# Patient Record
Sex: Male | Born: 1945 | Race: White | Hispanic: No | Marital: Married | Smoking: Never smoker
Health system: Southern US, Community
[De-identification: ages and names within clinical notes are randomized; demographics above are authoritative.]

## PROBLEM LIST (undated history)

## (undated) DIAGNOSIS — M1990 Unspecified osteoarthritis, unspecified site: Secondary | ICD-10-CM

## (undated) DIAGNOSIS — I2510 Atherosclerotic heart disease of native coronary artery without angina pectoris: Secondary | ICD-10-CM

## (undated) DIAGNOSIS — I428 Other cardiomyopathies: Secondary | ICD-10-CM

## (undated) DIAGNOSIS — R002 Palpitations: Secondary | ICD-10-CM

## (undated) DIAGNOSIS — R1310 Dysphagia, unspecified: Secondary | ICD-10-CM

## (undated) DIAGNOSIS — I5020 Unspecified systolic (congestive) heart failure: Secondary | ICD-10-CM

## (undated) DIAGNOSIS — I8393 Asymptomatic varicose veins of bilateral lower extremities: Secondary | ICD-10-CM

## (undated) DIAGNOSIS — I4589 Other specified conduction disorders: Secondary | ICD-10-CM

## (undated) DIAGNOSIS — Z95810 Presence of automatic (implantable) cardiac defibrillator: Secondary | ICD-10-CM

## (undated) DIAGNOSIS — N135 Crossing vessel and stricture of ureter without hydronephrosis: Secondary | ICD-10-CM

## (undated) DIAGNOSIS — H269 Unspecified cataract: Secondary | ICD-10-CM

## (undated) DIAGNOSIS — I440 Atrioventricular block, first degree: Secondary | ICD-10-CM

## (undated) DIAGNOSIS — K209 Esophagitis, unspecified without bleeding: Secondary | ICD-10-CM

## (undated) DIAGNOSIS — E7800 Pure hypercholesterolemia, unspecified: Secondary | ICD-10-CM

## (undated) DIAGNOSIS — I472 Ventricular tachycardia: Secondary | ICD-10-CM

## (undated) DIAGNOSIS — K219 Gastro-esophageal reflux disease without esophagitis: Secondary | ICD-10-CM

## (undated) DIAGNOSIS — K222 Esophageal obstruction: Secondary | ICD-10-CM

## (undated) DIAGNOSIS — I499 Cardiac arrhythmia, unspecified: Secondary | ICD-10-CM

## (undated) DIAGNOSIS — K5790 Diverticulosis of intestine, part unspecified, without perforation or abscess without bleeding: Secondary | ICD-10-CM

## (undated) DIAGNOSIS — N400 Enlarged prostate without lower urinary tract symptoms: Secondary | ICD-10-CM

## (undated) DIAGNOSIS — I5022 Chronic systolic (congestive) heart failure: Secondary | ICD-10-CM

## (undated) DIAGNOSIS — I420 Dilated cardiomyopathy: Secondary | ICD-10-CM

## (undated) HISTORY — DX: Benign prostatic hyperplasia without lower urinary tract symptoms: N40.0

## (undated) HISTORY — PX: RETINAL DETACHMENT SURGERY: SHX105

## (undated) HISTORY — DX: Diverticulosis of intestine, part unspecified, without perforation or abscess without bleeding: K57.90

## (undated) HISTORY — DX: Dysphagia, unspecified: R13.10

## (undated) HISTORY — PX: COLONOSCOPY: SHX174

## (undated) HISTORY — PX: JOINT REPLACEMENT: SHX530

## (undated) HISTORY — DX: Atherosclerotic heart disease of native coronary artery without angina pectoris: I25.10

## (undated) HISTORY — PX: PROSTATE BIOPSY: SHX241

## (undated) HISTORY — PX: EYE SURGERY: SHX253

## (undated) HISTORY — DX: Unspecified cataract: H26.9

## (undated) HISTORY — DX: Unspecified systolic (congestive) heart failure: I50.20

## (undated) HISTORY — DX: Asymptomatic varicose veins of bilateral lower extremities: I83.93

---

## 2009-12-21 ENCOUNTER — Emergency Department: Payer: Self-pay | Admitting: Emergency Medicine

## 2009-12-25 ENCOUNTER — Emergency Department: Payer: Self-pay | Admitting: Emergency Medicine

## 2009-12-26 ENCOUNTER — Emergency Department: Payer: Self-pay | Admitting: Unknown Physician Specialty

## 2010-01-10 ENCOUNTER — Ambulatory Visit: Payer: Self-pay | Admitting: Urology

## 2010-01-14 ENCOUNTER — Ambulatory Visit: Payer: Self-pay | Admitting: Urology

## 2012-01-02 ENCOUNTER — Ambulatory Visit: Payer: Self-pay | Admitting: Unknown Physician Specialty

## 2014-03-20 DIAGNOSIS — H2511 Age-related nuclear cataract, right eye: Secondary | ICD-10-CM | POA: Diagnosis not present

## 2014-04-12 DIAGNOSIS — H2511 Age-related nuclear cataract, right eye: Secondary | ICD-10-CM | POA: Diagnosis not present

## 2014-04-18 ENCOUNTER — Ambulatory Visit: Payer: Self-pay | Admitting: Ophthalmology

## 2014-04-18 DIAGNOSIS — I499 Cardiac arrhythmia, unspecified: Secondary | ICD-10-CM | POA: Diagnosis not present

## 2014-04-18 DIAGNOSIS — H2511 Age-related nuclear cataract, right eye: Secondary | ICD-10-CM | POA: Diagnosis not present

## 2014-04-18 DIAGNOSIS — K219 Gastro-esophageal reflux disease without esophagitis: Secondary | ICD-10-CM | POA: Diagnosis not present

## 2014-07-09 NOTE — Op Note (Signed)
PATIENT NAME:  Leroy Merritt, Leroy Merritt MR#:  [id number] DATE OF BIRTH:  06-07-1945  DATE OF PROCEDURE:  04/18/2014  PREOPERATIVE DIAGNOSIS:  Nuclear sclerotic cataract of the right eye.   POSTOPERATIVE DIAGNOSIS:  Nuclear sclerotic cataract of the right eye.   OPERATIVE PROCEDURE:  Cataract extraction by phacoemulsification with implant of intraocular lens to right eye.   SURGEON:  Birder Robson, MD.   ANESTHESIA:  1. Managed anesthesia care.  2. Topical tetracaine drops followed by 2% Xylocaine jelly applied in the preoperative holding area.   COMPLICATIONS:  None.   TECHNIQUE:   Stop and chop.   DESCRIPTION OF PROCEDURE:  The patient was examined and consented in the preoperative holding area where the aforementioned topical anesthesia was applied to the right eye and then brought back to the Operating Room where the right eye was prepped and draped in the usual sterile ophthalmic fashion and a lid speculum was placed. A paracentesis was created with the side port blade and the anterior chamber was filled with viscoelastic. A near clear corneal incision was performed with the steel keratome. A continuous curvilinear capsulorrhexis was performed with a cystotome followed by the capsulorrhexis forceps. Hydrodissection and hydrodelineation were carried out with BSS on a blunt cannula. The lens was removed in a stop and chop technique and the remaining cortical material was removed with the irrigation-aspiration handpiece. The capsular bag was inflated with viscoelastic and the Tecnis ZCB00, 17.0-diopter lens, serial number [id number] was placed in the capsular bag without complication. The remaining viscoelastic was removed from the eye with the irrigation-aspiration handpiece. The wounds were hydrated. The anterior chamber was flushed with Miostat and the eye was inflated to physiologic pressure. 0.1 mL of cefuroxime concentration 10 mg/mL was placed in the anterior chamber. The wounds were found to be  water tight. The eye was dressed with Vigamox. The patient was given protective glasses to wear throughout the day and a shield with which to sleep tonight. The patient was also given drops with which to begin a drop regimen today and will follow-up with me in one day.    ____________________________ Livingston Diones. Bond Grieshop, MD wlp:by D: 04/18/2014 21:17:14 ET T: 04/19/2014 00:00:59 ET JOB#: [id number]  cc: Tashanda Fuhrer L. Annabella Elford, MD, <Dictator> Livingston Diones Aarit Kashuba MD ELECTRONICALLY SIGNED 04/19/2014 12:12

## 2014-12-20 ENCOUNTER — Encounter: Payer: Self-pay | Admitting: Family Medicine

## 2014-12-20 ENCOUNTER — Ambulatory Visit (INDEPENDENT_AMBULATORY_CARE_PROVIDER_SITE_OTHER): Payer: Commercial Managed Care - HMO | Admitting: Family Medicine

## 2014-12-20 VITALS — BP 134/71 | HR 71 | Temp 97.8°F | Ht 69.0 in | Wt 181.0 lb

## 2014-12-20 DIAGNOSIS — I8393 Asymptomatic varicose veins of bilateral lower extremities: Secondary | ICD-10-CM

## 2014-12-20 DIAGNOSIS — N400 Enlarged prostate without lower urinary tract symptoms: Secondary | ICD-10-CM | POA: Insufficient documentation

## 2014-12-20 DIAGNOSIS — Z0000 Encounter for general adult medical examination without abnormal findings: Secondary | ICD-10-CM | POA: Diagnosis not present

## 2014-12-20 DIAGNOSIS — R1310 Dysphagia, unspecified: Secondary | ICD-10-CM | POA: Insufficient documentation

## 2014-12-20 HISTORY — DX: Dysphagia, unspecified: R13.10

## 2014-12-20 HISTORY — DX: Asymptomatic varicose veins of bilateral lower extremities: I83.93

## 2014-12-20 MED ORDER — FINASTERIDE 5 MG PO TABS: 5.0000 mg | ORAL_TABLET | Freq: Every day | ORAL | Status: DC

## 2014-12-20 MED ORDER — OMEPRAZOLE 40 MG PO CPDR: 40.0000 mg | DELAYED_RELEASE_CAPSULE | Freq: Every day | ORAL | Status: DC

## 2014-12-20 MED ORDER — TAMSULOSIN HCL 0.4 MG PO CAPS: 0.4000 mg | ORAL_CAPSULE | Freq: Every day | ORAL | Status: DC

## 2014-12-20 NOTE — Assessment & Plan Note (Signed)
With symptoms getting worse patient will start taking Prilosec and do GI referral

## 2014-12-20 NOTE — Assessment & Plan Note (Signed)
Discussed support hose 

## 2014-12-20 NOTE — Assessment & Plan Note (Signed)
The current medical regimen is effective;  continue present plan and medications.  

## 2014-12-20 NOTE — Progress Notes (Signed)
BP 134/71 mmHg  Pulse 71  Temp(Src) 97.8 F (36.6 C)  Ht 5\' 9"  (1.753 m)  Wt 181 lb (82.101 kg)  BMI 26.72 kg/m2  SpO2 98%   Subjective:    Patient ID: Leroy Merritt, male    DOB: 20-Feb-1946, 69 y.o.   MRN: [id number]  HPI: CHET GREENLEY is a 69 y.o. male  Chief Complaint  Patient presents with  . Annual Exam   patient with some dysphasia food getting stuck to the point he has to choke it up to swallow again. Patient having hoarseness and throat clearing has treated with Tums but no other medications. This problems been going on for urinary half or so but getting worse. BPH issues resolved and stable on medications. Patient still noting palpitations on review of old EKGs was having frequent PVCs   Relevant past medical, surgical, family and social history reviewed and updated as indicated. Interim medical history since our last visit reviewed. Allergies and medications reviewed and updated.  Review of Systems  Constitutional: Negative.   HENT: Negative.   Eyes: Negative.   Respiratory: Negative.   Cardiovascular: Negative.   Gastrointestinal: Negative.   Endocrine: Negative.   Genitourinary: Negative.   Musculoskeletal: Negative.   Skin: Negative.   Allergic/Immunologic: Negative.   Neurological: Negative.   Hematological: Negative.   Psychiatric/Behavioral: Negative.     Per HPI unless specifically indicated above     Objective:    BP 134/71 mmHg  Pulse 71  Temp(Src) 97.8 F (36.6 C)  Ht 5\' 9"  (1.753 m)  Wt 181 lb (82.101 kg)  BMI 26.72 kg/m2  SpO2 98%  Wt Readings from Last 3 Encounters:  12/20/14 181 lb (82.101 kg)  12/15/13 180 lb (81.647 kg)    Physical Exam  Constitutional: He is oriented to person, place, and time. He appears well-developed and well-nourished.  HENT:  Head: Normocephalic and atraumatic.  Right Ear: External ear normal.  Left Ear: External ear normal.  Eyes: Conjunctivae and EOM are normal. Pupils are equal, round, and  reactive to light.  Neck: Normal range of motion. Neck supple.  Cardiovascular: Normal rate, regular rhythm, normal heart sounds and intact distal pulses.   Pulmonary/Chest: Effort normal and breath sounds normal.  Abdominal: Soft. Bowel sounds are normal. There is no splenomegaly or hepatomegaly.  Genitourinary: Rectum normal and penis normal.  Prostate enlarged  Musculoskeletal: Normal range of motion.  Neurological: He is alert and oriented to person, place, and time. He has normal reflexes.  Skin: No rash noted. No erythema.  Psychiatric: He has a normal mood and affect. His behavior is normal. Judgment and thought content normal.    No results found for this or any previous visit.    Assessment & Plan:   Problem List Items Addressed This Visit      Cardiovascular and Mediastinum   Varicose veins of both lower extremities    Discussed support hose      Relevant Orders   TSH     Digestive   Dysphagia - Primary    With symptoms getting worse patient will start taking Prilosec and do GI referral      Relevant Orders   Ambulatory referral to Gastroenterology   Comprehensive metabolic panel   Lipid panel   CBC with Differential/Platelet   TSH     Genitourinary   BPH (benign prostatic hyperplasia)    The current medical regimen is effective;  continue present plan and medications.  Relevant Medications   finasteride (PROSCAR) 5 MG tablet   tamsulosin (FLOMAX) 0.4 MG CAPS capsule   Other Relevant Orders   Comprehensive metabolic panel   Lipid panel   CBC with Differential/Platelet   PSA   Urinalysis, Routine w reflex microscopic (not at [facility])   TSH    Other Visit Diagnoses    PE (physical exam), annual            Follow up plan: Return if symptoms worsen or fail to improve.

## 2014-12-21 ENCOUNTER — Encounter: Payer: Self-pay | Admitting: Family Medicine

## 2014-12-21 LAB — CBC WITH DIFFERENTIAL/PLATELET
BASOS: 0 %
Basophils Absolute: 0 10*3/uL (ref 0.0–0.2)
EOS (ABSOLUTE): 0.1 10*3/uL (ref 0.0–0.4)
Eos: 1 %
Hematocrit: 49 % (ref 37.5–51.0)
Hemoglobin: 16.4 g/dL (ref 12.6–17.7)
IMMATURE GRANS (ABS): 0 10*3/uL (ref 0.0–0.1)
Immature Granulocytes: 0 %
Lymphocytes Absolute: 2.2 10*3/uL (ref 0.7–3.1)
Lymphs: 28 %
MCH: 30.3 pg (ref 26.6–33.0)
MCHC: 33.5 g/dL (ref 31.5–35.7)
MCV: 91 fL (ref 79–97)
Monocytes Absolute: 0.6 10*3/uL (ref 0.1–0.9)
Monocytes: 8 %
NEUTROS ABS: 5 10*3/uL (ref 1.4–7.0)
Neutrophils: 63 %
Platelets: 160 10*3/uL (ref 150–379)
RBC: 5.41 x10E6/uL (ref 4.14–5.80)
RDW: 14.1 % (ref 12.3–15.4)
WBC: 8 10*3/uL (ref 3.4–10.8)

## 2014-12-21 LAB — MICROSCOPIC EXAMINATION: RENAL EPITHEL UA: NONE SEEN /HPF

## 2014-12-21 LAB — COMPREHENSIVE METABOLIC PANEL
A/G RATIO: 1.6 (ref 1.1–2.5)
ALK PHOS: 61 IU/L (ref 39–117)
ALT: 14 IU/L (ref 0–44)
AST: 17 IU/L (ref 0–40)
Albumin: 4.1 g/dL (ref 3.5–4.8)
BILIRUBIN TOTAL: 0.5 mg/dL (ref 0.0–1.2)
BUN/Creatinine Ratio: 16 (ref 10–22)
BUN: 17 mg/dL (ref 8–27)
CHLORIDE: 100 mmol/L (ref 97–108)
CO2: 26 mmol/L (ref 18–29)
Calcium: 9.4 mg/dL (ref 8.6–10.2)
Creatinine, Ser: 1.09 mg/dL (ref 0.76–1.27)
GFR calc Af Amer: 79 mL/min/{1.73_m2} (ref >59)
GFR calc non Af Amer: 68 mL/min/{1.73_m2} (ref >59)
GLOBULIN, TOTAL: 2.5 g/dL (ref 1.5–4.5)
Glucose: 96 mg/dL (ref 65–99)
Potassium: 4.4 mmol/L (ref 3.5–5.2)
SODIUM: 141 mmol/L (ref 134–144)
Total Protein: 6.6 g/dL (ref 6.0–8.5)

## 2014-12-21 LAB — URINALYSIS, ROUTINE W REFLEX MICROSCOPIC
Bilirubin, UA: NEGATIVE
GLUCOSE, UA: NEGATIVE
KETONES UA: NEGATIVE
NITRITE UA: NEGATIVE
Protein, UA: NEGATIVE
SPEC GRAV UA: 1.005 (ref 1.005–1.030)
Urobilinogen, Ur: 0.2 mg/dL (ref 0.2–1.0)
pH, UA: 5.5 (ref 5.0–7.5)

## 2014-12-21 LAB — LIPID PANEL
CHOLESTEROL TOTAL: 166 mg/dL (ref 100–199)
Chol/HDL Ratio: 2.9 ratio units (ref 0.0–5.0)
HDL: 58 mg/dL (ref >39)
LDL Calculated: 96 mg/dL (ref 0–99)
TRIGLYCERIDES: 60 mg/dL (ref 0–149)
VLDL Cholesterol Cal: 12 mg/dL (ref 5–40)

## 2014-12-21 LAB — TSH: TSH: 1.38 u[IU]/mL (ref 0.450–4.500)

## 2014-12-21 LAB — PSA: PROSTATE SPECIFIC AG, SERUM: 1.3 ng/mL (ref 0.0–4.0)

## 2015-02-06 ENCOUNTER — Ambulatory Visit (INDEPENDENT_AMBULATORY_CARE_PROVIDER_SITE_OTHER): Payer: Commercial Managed Care - HMO | Admitting: Gastroenterology

## 2015-02-06 ENCOUNTER — Other Ambulatory Visit: Payer: Self-pay

## 2015-02-06 ENCOUNTER — Encounter: Payer: Self-pay | Admitting: Gastroenterology

## 2015-02-06 VITALS — BP 121/81 | HR 89 | Temp 97.8°F | Ht 70.0 in | Wt 183.4 lb

## 2015-02-06 DIAGNOSIS — R1310 Dysphagia, unspecified: Secondary | ICD-10-CM

## 2015-02-06 NOTE — Progress Notes (Signed)
Gastroenterology Consultation  Referring Provider:     [name], MD Primary Care Physician:  Golden Pop, MD Primary Gastroenterologist:  Dr. Allen Norris     Reason for Consultation:     Dysphagia        HPI:   Leroy Merritt is a 69 y.o. y/o male referred for consultation & management of dysphagia by Dr. Golden Pop, MD.  This patient comes in today after reporting to his doctor that he had intermittent heartburn and dysphagia. The patient was started on Prilosec and was taking intermittently without any problems but then started taking every day. The patient states that after taking it every day he started to have a rash. The patient is now treating his infrequent heartburns with Tums. The patient was reporting that he has some dysphagia with problems swallowing pulled pork, beef and chicken. He also has trouble with some bread. The patient has had to vomit the food up if it got stuck. There is no report of any unexplained weight loss fevers chills nausea vomiting diarrhea or constipation. The patient had a colonoscopy approximate 6 years ago and he reports that he is not due for colonoscopy until he is 32.  Past Medical History  Diagnosis Date  . BPH (benign prostatic hyperplasia)   . 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 Dysphagia 12/20/2014    History reviewed. No pertinent past surgical history.  Prior to Admission medications   Medication Sig Start Date End Date Taking? Authorizing Provider  finasteride (PROSCAR) 5 MG tablet Take 1 tablet (5 mg total) by mouth at bedtime. 12/20/14  Yes [name], MD  Multiple Vitamins-Minerals (MULTIVITAMIN ADULT PO)  01/11/15  Yes Historical Provider, MD  tamsulosin (FLOMAX) 0.4 MG CAPS capsule Take 1 capsule (0.4 mg total) by mouth daily. 12/20/14  Yes [name], MD  omeprazole (PRILOSEC) 40 MG capsule Take 1 capsule (40 mg total) by mouth daily. Patient not taking: Reported on 02/06/2015 12/20/14   [name], MD    Family History  Problem Relation Age of Onset  . Arthritis Mother   . Diabetes Father   . Heart disease Brother   . Hyperlipidemia Brother   . Hypertension Brother      Social History  Substance Use Topics  . Smoking status: Never Smoker   . Smokeless tobacco: Never Used  . Alcohol Use: Yes    Allergies as of 02/06/2015 - Review Complete 02/06/2015  Allergen Reaction Noted  . Omeprazole Rash 02/06/2015    Review of Systems:    All systems reviewed and negative except where noted in HPI.   Physical Exam:  BP 121/81 mmHg  Pulse 89  Temp(Src) 97.8 F (36.6 C) (Oral)  Ht 5\' 10"  (1.778 m)  Wt 183 lb 6.4 oz (83.19 kg)  BMI 26.32 kg/m2 No LMP for male patient. Psych:  Alert and cooperative. Normal mood and affect. General:   Alert,  Well-developed, well-nourished, pleasant and cooperative in NAD Head:  Normocephalic and atraumatic. Eyes:  Sclera clear, no icterus.   Conjunctiva pink. Ears:  Normal auditory acuity. Nose:  No deformity, discharge, or lesions. Mouth:  No deformity or lesions,oropharynx pink & moist. Neck:  Supple; no masses or thyromegaly. Lungs:  Respirations even and unlabored.  Clear throughout to auscultation.   No wheezes, crackles, or rhonchi. No acute distress. Heart:  Regular rate and rhythm; no murmurs, clicks, rubs, or gallops. Abdomen:  Normal bowel sounds.  No bruits.  Soft, non-tender and non-distended without masses, hepatosplenomegaly or hernias noted.  No guarding or rebound tenderness.  Negative Carnett sign.   Rectal:  Deferred.  Msk:  Symmetrical without gross deformities.  Good, equal movement & strength bilaterally. Pulses:  Normal pulses noted. Extremities:  No clubbing or edema.  No cyanosis. Neurologic:  Alert and oriented x3;  grossly normal neurologically. Skin:  Intact without significant lesions or rashes.  No jaundice. Lymph Nodes:  No significant cervical adenopathy. Psych:  Alert and cooperative. Normal mood  and affect.  Imaging Studies: No results found.  Assessment and Plan:   Leroy Merritt is a 69 y.o. y/o male who has a history of dysphagia and was put on a PPI. The patient has infrequent heartburn and is well-controlled with Tums. The PPI daily from a rash and he stopped it. The patient will be set up for an EGD due to the dysphagia to rule out a stricture or neoplasm. The patient has been explained the plan and agrees with it.I have discussed risks & benefits which include, but are not limited to, bleeding, infection, perforation & drug reaction.  The patient agrees with this plan & written consent will be obtained.      Note: This dictation was prepared with Dragon dictation along with smaller phrase technology. Any transcriptional errors that result from this process are unintentional.

## 2015-02-16 NOTE — Discharge Instructions (Signed)

## 2015-02-19 ENCOUNTER — Ambulatory Visit: Payer: Commercial Managed Care - HMO | Admitting: Anesthesiology

## 2015-02-19 ENCOUNTER — Ambulatory Visit
Admission: RE | Admit: 2015-02-19 | Discharge: 2015-02-19 | Disposition: A | Discharge status: Home / Self Care | Payer: Commercial Managed Care - HMO | Source: Ambulatory Visit | Attending: Gastroenterology | Admitting: Gastroenterology

## 2015-02-19 ENCOUNTER — Encounter
Admission: RE | Disposition: A | Discharge status: Home / Self Care | Payer: Self-pay | Source: Ambulatory Visit | Attending: Gastroenterology

## 2015-02-19 DIAGNOSIS — Z79899 Other long term (current) drug therapy: Secondary | ICD-10-CM | POA: Diagnosis not present

## 2015-02-19 DIAGNOSIS — I499 Cardiac arrhythmia, unspecified: Secondary | ICD-10-CM | POA: Diagnosis not present

## 2015-02-19 DIAGNOSIS — Z888 Allergy status to other drugs, medicaments and biological substances status: Secondary | ICD-10-CM | POA: Insufficient documentation

## 2015-02-19 DIAGNOSIS — N400 Enlarged prostate without lower urinary tract symptoms: Secondary | ICD-10-CM | POA: Insufficient documentation

## 2015-02-19 DIAGNOSIS — K209 Esophagitis, unspecified: Secondary | ICD-10-CM | POA: Diagnosis not present

## 2015-02-19 DIAGNOSIS — K2100 Gastro-esophageal reflux disease with esophagitis, without bleeding: Secondary | ICD-10-CM | POA: Insufficient documentation

## 2015-02-19 DIAGNOSIS — K222 Esophageal obstruction: Secondary | ICD-10-CM | POA: Insufficient documentation

## 2015-02-19 DIAGNOSIS — R1310 Dysphagia, unspecified: Secondary | ICD-10-CM | POA: Insufficient documentation

## 2015-02-19 HISTORY — DX: Cardiac arrhythmia, unspecified: I49.9

## 2015-02-19 HISTORY — PX: ESOPHAGOGASTRODUODENOSCOPY (EGD) WITH PROPOFOL: SHX5813

## 2015-02-19 SURGERY — ESOPHAGOGASTRODUODENOSCOPY (EGD) WITH PROPOFOL
Anesthesia: Monitor Anesthesia Care | Wound class: Clean Contaminated

## 2015-02-19 MED ORDER — PROPOFOL 10 MG/ML IV BOLUS
INTRAVENOUS | Status: DC | PRN
  Administered 2015-02-19: 40 mg via INTRAVENOUS
  Administered 2015-02-19: 100 mg via INTRAVENOUS

## 2015-02-19 MED ORDER — LACTATED RINGERS IV SOLN
INTRAVENOUS | Status: DC
  Administered 2015-02-19 (×2): via INTRAVENOUS

## 2015-02-19 MED ORDER — ACETAMINOPHEN 160 MG/5ML PO SOLN: 325.0000 mg | ORAL | Status: DC | PRN

## 2015-02-19 MED ORDER — ACETAMINOPHEN 325 MG PO TABS: 325.0000 mg | ORAL_TABLET | ORAL | Status: DC | PRN

## 2015-02-19 MED ORDER — GLYCOPYRROLATE 0.2 MG/ML IJ SOLN
INTRAMUSCULAR | Status: DC | PRN
  Administered 2015-02-19: 0.1 mg via INTRAVENOUS

## 2015-02-19 MED ORDER — LIDOCAINE HCL (CARDIAC) 20 MG/ML IV SOLN
INTRAVENOUS | Status: DC | PRN
  Administered 2015-02-19: 20 mg via INTRAVENOUS

## 2015-02-19 MED ORDER — RANITIDINE HCL 150 MG PO TABS: 150.0000 mg | ORAL_TABLET | Freq: Two times a day (BID) | ORAL | Status: DC

## 2015-02-19 SURGICAL SUPPLY — 39 items
BALLN DILATOR 10-12 8 (BALLOONS)
BALLN DILATOR 12-15 8 (BALLOONS)
BALLN DILATOR 15-18 8 (BALLOONS) ×3
BALLN DILATOR CRE 0-12 8 (BALLOONS)
BALLN DILATOR ESOPH 8 10 CRE (MISCELLANEOUS) IMPLANT
BALLOON DILATOR 12-15 8 (BALLOONS) IMPLANT
BALLOON DILATOR 15-18 8 (BALLOONS) ×1 IMPLANT
BALLOON DILATOR CRE 0-12 8 (BALLOONS) IMPLANT
BLOCK BITE 60FR ADLT L/F GRN (MISCELLANEOUS) ×3 IMPLANT
CANISTER SUCT 1200ML W/VALVE (MISCELLANEOUS) ×3 IMPLANT
FCP ESCP3.2XJMB 240X2.8X (MISCELLANEOUS)
FORCEPS BIOP RAD 4 LRG CAP 4 (CUTTING FORCEPS) IMPLANT
FORCEPS BIOP RJ4 240 W/NDL (MISCELLANEOUS)
FORCEPS ESCP3.2XJMB 240X2.8X (MISCELLANEOUS) IMPLANT
GOWN CVR UNV OPN BCK APRN NK (MISCELLANEOUS) ×2 IMPLANT
GOWN ISOL THUMB LOOP REG UNIV (MISCELLANEOUS) ×4
HEMOCLIP INSTINCT (CLIP) IMPLANT
INJECTOR VARIJECT VIN23 (MISCELLANEOUS) IMPLANT
KIT CO2 TUBING (TUBING) IMPLANT
KIT DEFENDO VALVE AND CONN (KITS) IMPLANT
KIT ENDO PROCEDURE OLY (KITS) ×3 IMPLANT
LIGATOR MULTIBAND 6SHOOTER MBL (MISCELLANEOUS) IMPLANT
MARKER SPOT ENDO TATTOO 5ML (MISCELLANEOUS) IMPLANT
PAD GROUND ADULT SPLIT (MISCELLANEOUS) IMPLANT
SNARE SHORT THROW 13M SML OVAL (MISCELLANEOUS) IMPLANT
SNARE SHORT THROW 30M LRG OVAL (MISCELLANEOUS) IMPLANT
SPOT EX ENDOSCOPIC TATTOO (MISCELLANEOUS)
SUCTION POLY TRAP 4CHAMBER (MISCELLANEOUS) IMPLANT
SYR INFLATION 60ML (SYRINGE) IMPLANT
TRAP SUCTION POLY (MISCELLANEOUS) IMPLANT
TUBING CONN 6MMX3.1M (TUBING)
TUBING SUCTION CONN 0.25 STRL (TUBING) IMPLANT
UNDERPAD 30X60 958B10 (PK) (MISCELLANEOUS) IMPLANT
VALVE BIOPSY ENDO (VALVE) IMPLANT
VARIJECT INJECTOR VIN23 (MISCELLANEOUS)
WATER AUXILLARY (MISCELLANEOUS) IMPLANT
WATER STERILE IRR 250ML POUR (IV SOLUTION) ×3 IMPLANT
WATER STERILE IRR 500ML POUR (IV SOLUTION) IMPLANT
WIRE CRE 18-20MM 8CM F G (MISCELLANEOUS) IMPLANT

## 2015-02-19 NOTE — Op Note (Signed)
[facility] Gastroenterology Patient Name: Leroy Merritt Procedure Date: 02/19/2015 9:06 AM MRN: [id number] Account #: [id number] Date of Birth: 07-14-1945 Admit Type: Outpatient Age: 69 Room: [facility] OR ROOM 01 Gender: Male Note Status: Finalized Procedure:         Upper GI endoscopy Indications:       Dysphagia Providers:         Lucilla Lame, MD Referring MD:      [name], MD (Referring MD) Medicines:         Propofol per Anesthesia Complications:     No immediate complications. Procedure:         Pre-Anesthesia Assessment:                    - Prior to the procedure, a History and Physical was                     performed, and patient medications and allergies were                     reviewed. The patient's tolerance of previous anesthesia                     was also reviewed. The risks and benefits of the procedure                     and the sedation options and risks were discussed with the                     patient. All questions were answered, and informed consent                     was obtained. Prior Anticoagulants: The patient has taken                     no previous anticoagulant or antiplatelet agents. ASA                     Grade Assessment: II - A patient with mild systemic                     disease. After reviewing the risks and benefits, the                     patient was deemed in satisfactory condition to undergo                     the procedure.                    After obtaining informed consent, the endoscope was passed                     under direct vision. Throughout the procedure, the                     patient's blood pressure, pulse, and oxygen saturations                     were monitored continuously. The Olympus GIF H180J                     endoscope (S#: [id number]) was introduced through the mouth,  and advanced to the second part of duodenum. The upper GI                     endoscopy was  accomplished without difficulty. The patient                     tolerated the procedure well. Findings:      Esophagitis with no bleeding was found in the lower third of the       esophagus.      One moderate benign-appearing, intrinsic stenosis was found at the       gastroesophageal junction. And was traversed. A TTS dilator was passed       through the scope. Dilation with a 15-16.5-18 mm balloon (to a maximum       balloon size of 16.5 mm) dilator was performed. The dilation site was       examined following endoscope reinsertion and showed moderate improvement       in luminal narrowing.      The stomach was normal.      The examined duodenum was normal. Impression:        - Reflux esophagitis.                    - Benign-appearing esophageal stenosis. Dilated.                    - Normal stomach.                    - Normal examined duodenum.                    - No specimens collected. Recommendation:    - Recommend acid suppression medication daily. Procedure Code(s): --- Professional ---                    [phone], Esophagogastroduodenoscopy, flexible, transoral;                     with transendoscopic balloon dilation of esophagus (less                     than 30 mm diameter) Diagnosis Code(s): --- Professional ---                    R13.10, Dysphagia, unspecified                    K21.0, Gastro-esophageal reflux disease with esophagitis                    K22.2, Esophageal obstruction CPT copyright 2014 American Medical Association. All rights reserved. The codes documented in this report are preliminary and upon coder review may  be revised to meet current compliance requirements. Lucilla Lame, MD 02/19/2015 9:16:39 AM This report has been signed electronically. Number of Addenda: 0 Note Initiated On: 02/19/2015 9:06 AM Total Procedure Duration: 0 hours 3 minutes 11 seconds       [facility]

## 2015-02-19 NOTE — Anesthesia Preprocedure Evaluation (Signed)
Anesthesia Evaluation  Patient identified by MRN, date of birth, ID band  Reviewed: Allergy & Precautions, H&P , NPO status , Patient's Chart, lab work & pertinent test results  Airway Mallampati: II  TM Distance: >3 FB Neck ROM: full    Dental no notable dental hx.    Pulmonary    Pulmonary exam normal       Cardiovascular Rhythm:regular Rate:Normal     Neuro/Psych    GI/Hepatic   Endo/Other    Renal/GU      Musculoskeletal   Abdominal   Peds  Hematology   Anesthesia Other Findings   Reproductive/Obstetrics                             Anesthesia Physical Anesthesia Plan  ASA: II  Anesthesia Plan: MAC   Post-op Pain Management:    Induction:   Airway Management Planned:   Additional Equipment:   Intra-op Plan:   Post-operative Plan:   Informed Consent: I have reviewed the patients History and Physical, chart, labs and discussed the procedure including the risks, benefits and alternatives for the proposed anesthesia with the patient or authorized representative who has indicated his/her understanding and acceptance.     Plan Discussed with: CRNA  Anesthesia Plan Comments:         Anesthesia Quick Evaluation  

## 2015-02-19 NOTE — H&P (Signed)
  [facility]  [address], [place], [place] [address] Phone: [phone] Fax : [phone]  Primary Care Physician:  Golden Pop, MD Primary Gastroenterologist:  Dr. Allen Norris  Pre-Procedure History & Physical: HPI:  Leroy Merritt is a 69 y.o. male is here for an endoscopy.   Past Medical History  Diagnosis Date  . BPH (benign prostatic hyperplasia)   . 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 Dysphagia 12/20/2014  . Dysrhythmia     OCCASIONAL SKIP A BEAT/ STRESS TEST OK-DR KOWALSKI    History reviewed. No pertinent past surgical history.  Prior to Admission medications   Medication Sig Start Date End Date Taking? Authorizing Provider  finasteride (PROSCAR) 5 MG tablet Take 1 tablet (5 mg total) by mouth at bedtime. 12/20/14  Yes [name], MD  Multiple Vitamins-Minerals (MULTIVITAMIN ADULT PO) 1 tablet daily. AM 01/11/15  Yes Historical Provider, MD  tamsulosin (FLOMAX) 0.4 MG CAPS capsule Take 1 capsule (0.4 mg total) by mouth daily. Patient taking differently: Take 0.4 mg by mouth daily. PM 12/20/14  Yes [name], MD    Allergies as of 02/06/2015 - Review Complete 02/06/2015  Allergen Reaction Noted  . Omeprazole Rash 02/06/2015    Family History  Problem Relation Age of Onset  . Arthritis Mother   . Diabetes Father   . Heart disease Brother   . Hyperlipidemia Brother   . Hypertension Brother     Social History   Social History  . Marital Status: Married    Spouse Name: N/A  . Number of Children: N/A  . Years of Education: N/A   Occupational History  . Not on file.   Social History Main Topics  . Smoking status: Never Smoker   . Smokeless tobacco: Never Used  . Alcohol Use: 0.6 oz/week    1 Glasses of wine per week  . Drug Use: No  . Sexual Activity: Not on file   Other Topics Concern  . Not on file   Social History Narrative    Review of Systems: See HPI, otherwise negative ROS  Physical  Exam: BP 157/87 mmHg  Pulse 74  Temp(Src) 97.5 F (36.4 C) (Temporal)  Resp 16  Ht 5\' 10"  (1.778 m)  Wt 180 lb (81.647 kg)  BMI 25.83 kg/m2  SpO2 100% General:   Alert,  pleasant and cooperative in NAD Head:  Normocephalic and atraumatic. Neck:  Supple; no masses or thyromegaly. Lungs:  Clear throughout to auscultation.    Heart:  Regular rate and rhythm. Abdomen:  Soft, nontender and nondistended. Normal bowel sounds, without guarding, and without rebound.   Neurologic:  Alert and  oriented x4;  grossly normal neurologically.  Impression/Plan: Leroy Merritt is here for an endoscopy to be performed for dysphagia  Risks, benefits, limitations, and alternatives regarding  endoscopy have been reviewed with the patient.  Questions have been answered.  All parties agreeable.   Ollen Bowl, MD  02/19/2015, 9:02 AM

## 2015-02-19 NOTE — Anesthesia Postprocedure Evaluation (Signed)
Anesthesia Post Note  Patient: Leroy Merritt  Procedure(s) Performed: Procedure(s) (LRB): ESOPHAGOGASTRODUODENOSCOPY (EGD) WITH PROPOFOL and esophageal dilation. (N/A)  Patient location during evaluation: PACU Anesthesia Type: MAC Level of consciousness: awake and alert and oriented Pain management: satisfactory to patient Vital Signs Assessment: post-procedure vital signs reviewed and stable Respiratory status: spontaneous breathing, nonlabored ventilation and respiratory function stable Cardiovascular status: blood pressure returned to baseline and stable Postop Assessment: Adequate PO intake and No signs of nausea or vomiting Anesthetic complications: no    Raliegh Ip

## 2015-02-19 NOTE — Transfer of Care (Signed)
Immediate Anesthesia Transfer of Care Note  Patient: Leroy Merritt  Procedure(s) Performed: Procedure(s): ESOPHAGOGASTRODUODENOSCOPY (EGD) WITH PROPOFOL (N/A)  Patient Location: PACU  Anesthesia Type: MAC  Level of Consciousness: awake, alert  and patient cooperative  Airway and Oxygen Therapy: Patient Spontanous Breathing and Patient connected to supplemental oxygen  Post-op Assessment: Post-op Vital signs reviewed, Patient's Cardiovascular Status Stable, Respiratory Function Stable, Patent Airway and No signs of Nausea or vomiting  Post-op Vital Signs: Reviewed and stable  Complications: No apparent anesthesia complications

## 2015-02-19 NOTE — Anesthesia Procedure Notes (Signed)
Procedure Name: MAC Performed by: Siham Bucaro Pre-anesthesia Checklist: Patient identified, Emergency Drugs available, Suction available, Patient being monitored and Timeout performed Patient Re-evaluated:Patient Re-evaluated prior to inductionOxygen Delivery Method: Nasal cannula       

## 2015-02-20 ENCOUNTER — Encounter: Payer: Self-pay | Admitting: Gastroenterology

## 2015-02-23 ENCOUNTER — Telehealth: Payer: Self-pay | Admitting: Gastroenterology

## 2015-02-23 NOTE — Telephone Encounter (Signed)
Patient called and has a question regarding the prescription that Dr. Allen Norris gave him the other day.

## 2015-02-26 ENCOUNTER — Other Ambulatory Visit: Payer: Self-pay

## 2015-02-26 DIAGNOSIS — K2100 Gastro-esophageal reflux disease with esophagitis, without bleeding: Secondary | ICD-10-CM

## 2015-02-26 MED ORDER — RANITIDINE HCL 150 MG PO TABS: 150.0000 mg | ORAL_TABLET | Freq: Two times a day (BID) | ORAL | Status: DC

## 2015-02-26 NOTE — Telephone Encounter (Signed)
Pt requested his Ranitidine 150mg  be sent to [facility] mail order pharmacy. Rx has been sent.

## 2015-03-11 DIAGNOSIS — K5790 Diverticulosis of intestine, part unspecified, without perforation or abscess without bleeding: Secondary | ICD-10-CM

## 2015-03-11 HISTORY — DX: Diverticulosis of intestine, part unspecified, without perforation or abscess without bleeding: K57.90

## 2015-03-11 HISTORY — PX: CATARACT EXTRACTION W/ INTRAOCULAR LENS IMPLANT: SHX1309

## 2015-05-13 DIAGNOSIS — J019 Acute sinusitis, unspecified: Secondary | ICD-10-CM | POA: Diagnosis not present

## 2015-05-13 DIAGNOSIS — J209 Acute bronchitis, unspecified: Secondary | ICD-10-CM | POA: Diagnosis not present

## 2015-08-05 ENCOUNTER — Inpatient Hospital Stay
Admission: EM | Admit: 2015-08-05 | Discharge: 2015-08-07 | DRG: 379 | Disposition: A | Discharge status: Home / Self Care | Payer: Commercial Managed Care - HMO | Attending: Internal Medicine | Admitting: Internal Medicine

## 2015-08-05 DIAGNOSIS — Z888 Allergy status to other drugs, medicaments and biological substances status: Secondary | ICD-10-CM

## 2015-08-05 DIAGNOSIS — K5730 Diverticulosis of large intestine without perforation or abscess without bleeding: Secondary | ICD-10-CM | POA: Diagnosis not present

## 2015-08-05 DIAGNOSIS — N400 Enlarged prostate without lower urinary tract symptoms: Secondary | ICD-10-CM | POA: Diagnosis not present

## 2015-08-05 DIAGNOSIS — R1310 Dysphagia, unspecified: Secondary | ICD-10-CM | POA: Diagnosis not present

## 2015-08-05 DIAGNOSIS — K5731 Diverticulosis of large intestine without perforation or abscess with bleeding: Principal | ICD-10-CM | POA: Diagnosis present

## 2015-08-05 DIAGNOSIS — Z8719 Personal history of other diseases of the digestive system: Secondary | ICD-10-CM | POA: Diagnosis not present

## 2015-08-05 DIAGNOSIS — K922 Gastrointestinal hemorrhage, unspecified: Secondary | ICD-10-CM | POA: Diagnosis not present

## 2015-08-05 DIAGNOSIS — K625 Hemorrhage of anus and rectum: Secondary | ICD-10-CM | POA: Diagnosis present

## 2015-08-05 DIAGNOSIS — Z79899 Other long term (current) drug therapy: Secondary | ICD-10-CM | POA: Diagnosis not present

## 2015-08-05 DIAGNOSIS — R9431 Abnormal electrocardiogram [ECG] [EKG]: Secondary | ICD-10-CM | POA: Diagnosis not present

## 2015-08-05 LAB — COMPREHENSIVE METABOLIC PANEL
ALT: 18 U/L (ref 17–63)
AST: 21 U/L (ref 15–41)
Albumin: 3.8 g/dL (ref 3.5–5.0)
Alkaline Phosphatase: 50 U/L (ref 38–126)
Anion gap: 8 (ref 5–15)
BUN: 17 mg/dL (ref 6–20)
CHLORIDE: 107 mmol/L (ref 101–111)
CO2: 23 mmol/L (ref 22–32)
Calcium: 8.6 mg/dL — ABNORMAL LOW (ref 8.9–10.3)
Creatinine, Ser: 1.23 mg/dL (ref 0.61–1.24)
GFR, EST NON AFRICAN AMERICAN: 57 mL/min — AB (ref >60)
Glucose, Bld: 136 mg/dL — ABNORMAL HIGH (ref 65–99)
POTASSIUM: 3.7 mmol/L (ref 3.5–5.1)
SODIUM: 138 mmol/L (ref 135–145)
Total Bilirubin: 0.8 mg/dL (ref 0.3–1.2)
Total Protein: 6.5 g/dL (ref 6.5–8.1)

## 2015-08-05 LAB — HEMOGLOBIN AND HEMATOCRIT, BLOOD
HEMATOCRIT: 42.7 % (ref 40.0–52.0)
HEMOGLOBIN: 14.2 g/dL (ref 13.0–18.0)

## 2015-08-05 LAB — TYPE AND SCREEN
ABO/RH(D): A NEG
Antibody Screen: NEGATIVE

## 2015-08-05 LAB — CBC
HEMATOCRIT: 44.2 % (ref 40.0–52.0)
HEMOGLOBIN: 14.7 g/dL (ref 13.0–18.0)
MCH: 29.2 pg (ref 26.0–34.0)
MCHC: 33.1 g/dL (ref 32.0–36.0)
MCV: 88 fL (ref 80.0–100.0)
Platelets: 152 10*3/uL (ref 150–440)
RBC: 5.03 MIL/uL (ref 4.40–5.90)
RDW: 14.9 % — ABNORMAL HIGH (ref 11.5–14.5)
WBC: 6.6 10*3/uL (ref 3.8–10.6)

## 2015-08-05 LAB — ABO/RH: ABO/RH(D): A NEG

## 2015-08-05 MED ORDER — SODIUM CHLORIDE 0.9% FLUSH
3.0000 mL | Freq: Two times a day (BID) | INTRAVENOUS | Status: DC
  Administered 2015-08-05: 22:00:00 3 mL via INTRAVENOUS

## 2015-08-05 MED ORDER — FINASTERIDE 5 MG PO TABS
5.0000 mg | ORAL_TABLET | Freq: Every day | ORAL | Status: DC
  Administered 2015-08-05 – 2015-08-06 (×2): 5 mg via ORAL
  Filled 2015-08-05 (×2): qty 1

## 2015-08-05 MED ORDER — ACETAMINOPHEN 325 MG PO TABS
650.0000 mg | ORAL_TABLET | Freq: Four times a day (QID) | ORAL | Status: DC | PRN
Start: 2015-08-05 — End: 2015-08-07

## 2015-08-05 MED ORDER — ACETAMINOPHEN 650 MG RE SUPP
650.0000 mg | Freq: Four times a day (QID) | RECTAL | Status: DC | PRN
Start: 2015-08-05 — End: 2015-08-07

## 2015-08-05 MED ORDER — TAMSULOSIN HCL 0.4 MG PO CAPS
0.4000 mg | ORAL_CAPSULE | Freq: Every day | ORAL | Status: DC
  Administered 2015-08-05 – 2015-08-07 (×2): 0.4 mg via ORAL
  Filled 2015-08-05 (×2): qty 1

## 2015-08-05 MED ORDER — SODIUM CHLORIDE 0.9 % IV SOLN
INTRAVENOUS | Status: DC
  Administered 2015-08-05: 22:00:00 via INTRAVENOUS

## 2015-08-05 MED ORDER — PANTOPRAZOLE SODIUM 40 MG IV SOLR
40.0000 mg | INTRAVENOUS | Status: DC
  Administered 2015-08-05 – 2015-08-06 (×2): 40 mg via INTRAVENOUS
  Filled 2015-08-05 (×2): qty 40

## 2015-08-05 NOTE — ED Provider Notes (Signed)
[facility] Emergency Department Provider Note   ____________________________________________  Time seen: Approximately 620 PM  I have reviewed the triage vital signs and the nursing notes.   HISTORY  Chief Complaint Rectal Bleeding   HPI Leroy Merritt is a 70 y.o. male with a history of GERD as well as BPH was presenting to the emergency department today with bright red blood per rectum. He says that he has had 4 episodes of bright red bleeding. Denies any pain. Says that he usually moves his bowels about 3 times per day. Has never had bleeding before. Does not strain to move his bowels. No issues with constipation in the past 9 history of diverticular disease. Other than the bleeding the patient says that he feels at baseline. No shortness of breath. No pain.   Past Medical History  Diagnosis Date  . BPH (benign prostatic hyperplasia)   . 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 Dysphagia 12/20/2014  . Dysrhythmia     OCCASIONAL SKIP A BEAT/ STRESS TEST OK-DR [facility]    Patient Active Problem List   Diagnosis Date Noted  . Swallowing difficulty   . Reflux esophagitis   . Stricture and stenosis of esophagus   . Dysphagia 12/20/2014  . BPH (benign prostatic hyperplasia) 12/20/2014  . 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 Esophagogastroduodenoscopy (egd) with propofol N/A 02/19/2015    Procedure: ESOPHAGOGASTRODUODENOSCOPY (EGD) WITH PROPOFOL and esophageal dilation.;  Surgeon: Lucilla Lame, MD;  Location: [place];  Service: Endoscopy;  Laterality: N/A;    Current Outpatient Rx  Name  Route  Sig  Dispense  Refill  . Calcium Carbonate Antacid 600 MG chewable tablet               . finasteride (PROSCAR) 5 MG tablet   Oral   Take 1 tablet (5 mg total) by mouth at bedtime.   90 tablet   4   . Multiple Vitamins-Minerals (MULTIVITAMIN ADULT  PO)      1 tablet daily. AM         . omeprazole (PRILOSEC) 40 MG capsule               . ranitidine (ZANTAC) 150 MG tablet   Oral   Take 1 tablet (150 mg total) by mouth 2 (two) times daily.   180 tablet   3   . tamsulosin (FLOMAX) 0.4 MG CAPS capsule   Oral   Take 1 capsule (0.4 mg total) by mouth daily. Patient taking differently: Take 0.4 mg by mouth daily. PM   90 capsule   4     Allergies Omeprazole  Family History  Problem Relation Age of Onset  . Arthritis Mother   . Diabetes Father   . Heart disease Brother   . Hyperlipidemia Brother   . Hypertension Brother     Social History Social History  Substance Use Topics  . Smoking status: Never Smoker   . Smokeless tobacco: Never Used  . Alcohol Use: 0.6 oz/week    1 Glasses of wine per week    Review of Systems Constitutional: No fever/chills Eyes: No visual changes. ENT: No sore throat. Cardiovascular: Denies chest pain. Respiratory: Denies shortness of breath. Gastrointestinal: No abdominal pain.  No nausea, no vomiting.  No diarrhea.  No constipation. Genitourinary: Negative for dysuria. Musculoskeletal: Negative for back pain. Skin: Negative for rash. Neurological: Negative for headaches, focal  weakness or numbness.  10-point ROS otherwise negative.  ____________________________________________   PHYSICAL EXAM:  VITAL SIGNS: ED Triage Vitals  Enc Vitals Group     BP 08/05/15 1659 164/73 mmHg     Pulse Rate 08/05/15 1659 43     Resp 08/05/15 1659 18     Temp 08/05/15 1659 98.2 F (36.8 C)     Temp Source 08/05/15 1659 Oral     SpO2 08/05/15 1659 99 %     Weight 08/05/15 1659 185 lb (83.915 kg)     Height 08/05/15 1659 5\' 10"  (1.778 m)     Head Cir --      Peak Flow --      Pain Score --      Pain Loc --      Pain Edu? --      Excl. in [place]? --     Constitutional: Alert and oriented. Well appearing and in no acute distress. Eyes: Conjunctivae are normal. PERRL. EOMI. Head:  Atraumatic. Nose: No congestion/rhinnorhea. Mouth/Throat: Mucous membranes are moist.   Neck: No stridor.   Cardiovascular: Normal rate, regular rhythm. Grossly normal heart sounds.   Respiratory: Normal respiratory effort.  No retractions. Lungs CTAB. Gastrointestinal: Soft and nontender. No distention. Rectal exam without an external hemorrhoids. Digital rectal exam with bright red blood on the glove without any stool. Musculoskeletal: No lower extremity tenderness nor edema.  No joint effusions. Neurologic:  Normal speech and language. No gross focal neurologic deficits are appreciated.  Skin:  Skin is warm, dry and intact. No rash noted. Psychiatric: Mood and affect are normal. Speech and behavior are normal.  ____________________________________________   LABS (all labs ordered are listed, but only abnormal results are displayed)  Labs Reviewed  COMPREHENSIVE METABOLIC PANEL - Abnormal; Notable for the following:    Glucose, Bld 136 (*)    Calcium 8.6 (*)    GFR calc non Af Amer 57 (*)    All other components within normal limits  CBC - Abnormal; Notable for the following:    RDW 14.9 (*)    All other components within normal limits  POC OCCULT BLOOD, ED  TYPE AND SCREEN  ABO/RH   ____________________________________________  EKG  ED ECG REPORT I, Doran Stabler, the attending physician, personally viewed and interpreted this ECG.   Date: 08/05/2015  EKG Time: 1710  Rate: 84  Rhythm: normal sinus rhythm with PVCs.  Axis: Normal  Intervals:Incomplete right bundle branch block.  ST&T Change: No ST elevation or depression. No abnormal T-wave inversions.  ____________________________________________  RADIOLOGY   ____________________________________________   PROCEDURES  ____________________________________________   INITIAL IMPRESSION / ASSESSMENT AND PLAN / ED COURSE  Pertinent labs & imaging results that were available during my care of the patient  were reviewed by me and considered in my medical decision making (see chart for details).  Patient with multiple episodes of gastric intestinal bleeding. Positive digital rectal exam. We'll admit to the hospital. Signed out to Dr. Marcille Blanco. His foot and the plan to the patient who is understanding and wanted to comply. ____________________________________________   FINAL CLINICAL IMPRESSION(S) / ED DIAGNOSES  GI bleed.    NEW MEDICATIONS STARTED DURING THIS VISIT:  New Prescriptions   No medications on file     Note:  This document was prepared using Dragon voice recognition software and may include unintentional dictation errors.    Orbie Pyo, MD 08/05/15 [id number]

## 2015-08-05 NOTE — ED Notes (Signed)
Pt reports diarrhea with bright red blood 4 times today. Pt reports large amount of red blood. Denies anticoagulants. Denies pain.

## 2015-08-05 NOTE — ED Notes (Signed)
Pt from lobby to room 9 with rectal bleeding. Pt alert, oriented, denies pain. States he has to get up often to the toilet. Pt on monitor but showed how to disconnect to go to toilet.

## 2015-08-05 NOTE — H&P (Signed)
PCP:   Golden Pop, MD   Chief Complaint:  Rectal bleeding  HPI: This is a pleasant 70 year old gentleman who states that this morning he developed diarrhea and bright red blood per rectum. He's had this several times today, finally prompting him to come to the ER. Since being in the ER he's had several bloody stools. He denies any abdominal pain, fevers, chills, nausea or vomiting. He denies being lightheaded. He states he has no history of constipation nor weight loss. He states his appetite is good. His last colonoscopy was approximately 3 years ago, he states that it was normal and he was told to follow up for repeat colonoscopy in 10 years. He had a recent EGD with dilation for dysphagia. He doesn't have any history of GERD and states he uses no NSAIDs.  Review of Systems:  The patient denies anorexia, fever, weight loss,, vision loss, decreased hearing, hoarseness, chest pain, syncope, dyspnea on exertion, peripheral edema, balance deficits, hemoptysis, abdominal pain, melena, bright red blood per rectum, hematochezia, severe indigestion/heartburn, hematuria, incontinence, genital sores, muscle weakness, suspicious skin lesions, transient blindness, difficulty walking, depression, unusual weight change, abnormal bleeding, enlarged lymph nodes, angioedema, and breast masses.  Past Medical History: Past Medical History  Diagnosis Date  . BPH (benign prostatic hyperplasia)   . 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 Dysphagia 12/20/2014  . Dysrhythmia     OCCASIONAL SKIP A BEAT/ STRESS TEST OK-DR Nehemiah Massed   Past Surgical History  Procedure Laterality Date  . Esophagogastroduodenoscopy (egd) with propofol N/A 02/19/2015    Procedure: ESOPHAGOGASTRODUODENOSCOPY (EGD) WITH PROPOFOL and esophageal dilation.;  Surgeon: Lucilla Lame, MD;  Location: [place];  Service: Endoscopy;  Laterality: N/A;    Medications: Prior to Admission medications   Medication Sig  Start Date End Date Taking? Authorizing Provider  finasteride (PROSCAR) 5 MG tablet Take 1 tablet (5 mg total) by mouth at bedtime. 12/20/14  Yes [name], MD  Multiple Vitamins-Minerals (MULTIVITAMIN ADULT PO) Take 1 tablet by mouth every morning.  01/11/15  Yes Historical Provider, MD  ranitidine (ZANTAC) 150 MG tablet Take 1 tablet (150 mg total) by mouth 2 (two) times daily. 02/26/15  Yes Lucilla Lame, MD  tamsulosin (FLOMAX) 0.4 MG CAPS capsule Take 1 capsule (0.4 mg total) by mouth daily. Patient taking differently: Take 0.4 mg by mouth daily. PM 12/20/14  Yes [name], MD    Allergies:   Allergies  Allergen Reactions  . Omeprazole Rash    Social History:  reports that he has never smoked. He has never used smokeless tobacco. He reports that he drinks about 0.6 oz of alcohol per week. He reports that he does not use illicit drugs.  Family History: Family History  Problem Relation Age of Onset  . Arthritis Mother   . Diabetes Father   . Heart disease Brother   . Hyperlipidemia Brother   . Hypertension Brother     Physical Exam: Filed Vitals:   08/05/15 1659 08/05/15 1800 08/05/15 1941  BP: 164/73 133/68 119/79  Pulse: 43 29 83  Temp: 98.2 F (36.8 C)    TempSrc: Oral    Resp: 18 14 18   Height: 5\' 10"  (1.778 m)    Weight: 83.915 kg (185 lb)    SpO2: 99% 99% 97%    General:  Alert and oriented times three, well developed and nourished, no acute distress Eyes: PERRLA, pink conjunctiva, no scleral icterus ENT: Moist oral mucosa, neck supple, no thyromegaly Lungs:  clear to ascultation, no wheeze, no crackles, no use of accessory muscles Cardiovascular: regular rate and rhythm, no regurgitation, no gallops, no murmurs. No carotid bruits, no JVD Abdomen: soft, positive BS, non-tender, non-distended, no organomegaly, not an acute abdomen GU: not examined Neuro: CN II - XII grossly intact, sensation intact Musculoskeletal: strength 5/5 all extremities, no  clubbing, cyanosis or edema Skin: no rash, no subcutaneous crepitation, no decubitus Psych: appropriate patient   Labs on Admission:   Recent Labs  08/05/15 1716  NA 138  K 3.7  CL 107  CO2 23  GLUCOSE 136*  BUN 17  CREATININE 1.23  CALCIUM 8.6*    Recent Labs  08/05/15 1716  AST 21  ALT 18  ALKPHOS 50  BILITOT 0.8  PROT 6.5  ALBUMIN 3.8   No results for input(s): LIPASE, AMYLASE in the last 72 hours.  Recent Labs  08/05/15 1716  WBC 6.6  HGB 14.7  HCT 44.2  MCV 88.0  PLT 152   No results for input(s): CKTOTAL, CKMB, CKMBINDEX, TROPONINI in the last 72 hours. Invalid input(s): POCBNP No results for input(s): DDIMER in the last 72 hours. No results for input(s): HGBA1C in the last 72 hours. No results for input(s): CHOL, HDL, LDLCALC, TRIG, CHOLHDL, LDLDIRECT in the last 72 hours. No results for input(s): TSH, T4TOTAL, T3FREE, THYROIDAB in the last 72 hours.  Invalid input(s): FREET3 No results for input(s): VITAMINB12, FOLATE, FERRITIN, TIBC, IRON, RETICCTPCT in the last 72 hours.  Micro Results: No results found for this or any previous visit (from the past 240 hour(s)).   Radiological Exams on Admission: No results found.  Assessment/Plan Present on Admission:  . Rectal bleed -Admit to MedSurg -Suspect diverticular bleed, may patient nothing by mouth with IV fluid hydration -Serial H&H ordered -Consulted GI to see patient  -Protonix IV ordered  . BPH (benign prostatic hyperplasia) -Stable, resume home medications  History of esophageal strictures -Recent dilation by Dr. Verl Blalock   SCDs for DVT prophylaxis  Jazzmyne Rasnick 08/05/2015, 7:48 PM

## 2015-08-06 LAB — PROTIME-INR
INR: 1.28
PROTHROMBIN TIME: 16.1 s — AB (ref 11.4–15.0)

## 2015-08-06 LAB — BASIC METABOLIC PANEL
ANION GAP: 3 — AB (ref 5–15)
BUN: 14 mg/dL (ref 6–20)
CALCIUM: 8.2 mg/dL — AB (ref 8.9–10.3)
CHLORIDE: 111 mmol/L (ref 101–111)
CO2: 26 mmol/L (ref 22–32)
Creatinine, Ser: 1.05 mg/dL (ref 0.61–1.24)
GFR calc non Af Amer: >60 mL/min (ref >60)
GLUCOSE: 107 mg/dL — AB (ref 65–99)
POTASSIUM: 3.8 mmol/L (ref 3.5–5.1)
Sodium: 140 mmol/L (ref 135–145)

## 2015-08-06 LAB — CBC
HEMATOCRIT: 39.5 % — AB (ref 40.0–52.0)
HEMOGLOBIN: 13.4 g/dL (ref 13.0–18.0)
MCH: 30.3 pg (ref 26.0–34.0)
MCHC: 33.9 g/dL (ref 32.0–36.0)
MCV: 89.5 fL (ref 80.0–100.0)
Platelets: 136 10*3/uL — ABNORMAL LOW (ref 150–440)
RBC: 4.41 MIL/uL (ref 4.40–5.90)
RDW: 14.4 % (ref 11.5–14.5)
WBC: 5.9 10*3/uL (ref 3.8–10.6)

## 2015-08-06 LAB — HEMOGLOBIN AND HEMATOCRIT, BLOOD
HEMATOCRIT: 39.6 % — AB (ref 40.0–52.0)
Hemoglobin: 13.4 g/dL (ref 13.0–18.0)

## 2015-08-06 NOTE — Consult Note (Signed)
GI Inpatient Consult Note  Reason for Consult:LGI bleeding   Attending Requesting Consult:Leroy Merritt  History of Present Illness: Leroy Merritt is a 70 y.o. male had onset of BRBPR yesterday morning and had several bowel movements of fresh blood, maybe 5-6, he came to ER and was admitted for LGI bleeding.  His bleeding has stopped since this morning.  He denies any abd pain, no vomiting, no fever, no bleeding anywhere else.  He has a PMH of multiple small diverticulosis of sigmoid colon.  He and his wife both say they have been eating a lot of nuts lately.  Past Medical History:  Past Medical History  Diagnosis Date  . BPH (benign prostatic hyperplasia)   . 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 Dysphagia 12/20/2014  . Dysrhythmia     OCCASIONAL SKIP A BEAT/ STRESS TEST OK-DR [facility]    Problem List: Patient Active Problem List   Diagnosis Date Noted  . Rectal bleed 08/05/2015  . Swallowing difficulty   . Reflux esophagitis   . Stricture and stenosis of esophagus   . Dysphagia 12/20/2014  . BPH (benign prostatic hyperplasia) 12/20/2014  . Varicose veins of both lower extremities 12/20/2014    Past Surgical History: Past Surgical History  Procedure Laterality Date  . Esophagogastroduodenoscopy (egd) with propofol N/A 02/19/2015    Procedure: ESOPHAGOGASTRODUODENOSCOPY (EGD) WITH PROPOFOL and esophageal dilation.;  Surgeon: Leroy Lame, MD;  Location: [place];  Service: Endoscopy;  Laterality: N/A;    Allergies: Allergies  Allergen Reactions  . Omeprazole Rash    Home Medications: Prescriptions prior to admission  Medication Sig Dispense Refill Last Dose  . finasteride (PROSCAR) 5 MG tablet Take 1 tablet (5 mg total) by mouth at bedtime. 90 tablet 4 08/04/2015 at Unknown time  . Multiple Vitamins-Minerals (MULTIVITAMIN ADULT PO) Take 1 tablet by mouth every morning.    08/05/2015 at Unknown time  . ranitidine (ZANTAC) 150 MG tablet Take 1 tablet  (150 mg total) by mouth 2 (two) times daily. 180 tablet 3 08/05/2015 at Unknown time  . tamsulosin (FLOMAX) 0.4 MG CAPS capsule Take 1 capsule (0.4 mg total) by mouth daily. (Patient taking differently: Take 0.4 mg by mouth daily. PM) 90 capsule 4 08/04/2015 at Unknown time   Home medication reconciliation was completed with the patient.   Scheduled Inpatient Medications:   . finasteride  5 mg Oral QHS  . pantoprazole (PROTONIX) IV  40 mg Intravenous Q24H  . sodium chloride flush  3 mL Intravenous Q12H  . tamsulosin  0.4 mg Oral Daily    Continuous Inpatient Infusions:   . sodium chloride 30 mL/hr at 08/06/15 0929    PRN Inpatient Medications:  acetaminophen **OR** acetaminophen  Family History: family history includes Arthritis in his mother; Diabetes in his father; Heart disease in his brother; Hyperlipidemia in his brother; Hypertension in his brother.  The patient's family history is negative for inflammatory bowel disorders, GI malignancy, or solid organ transplantation.  Social History:   reports that he has never smoked. He has never used smokeless tobacco. He reports that he drinks about 0.6 oz of alcohol per week. He reports that he does not use illicit drugs. The patient denies ETOH, tobacco, or drug use.   Review of Systems: Constitutional: Weight is stable.  Eyes: No changes in vision. ENT: No oral lesions, sore throat.  GI: see HPI.  Heme/Lymph: No easy bruising.  CV: No chest pain.  GU: No hematuria.  Integumentary: No rashes.  Neuro: No headaches.  Psych: No depression/anxiety.  Endocrine: No heat/cold intolerance.  Allergic/Immunologic: No urticaria.  Resp: No cough, SOB.  Musculoskeletal: No joint swelling.    Physical Examination: BP 111/65 mmHg  Pulse 74  Temp(Src) 97.8 F (36.6 C) (Oral)  Resp 18  Ht 5\' 10"  (1.778 m)  Wt 81.466 kg (179 lb 9.6 oz)  BMI 25.77 kg/m2  SpO2 99% Gen: NAD, alert and oriented x 4 HEENT: sclera non icteric  And  conjunctiva neg. Neck: supple, no JVD or thyromegaly Chest: CTA bilaterally, no wheezes, crackles, or other adventitious sounds CV: RRR, no m/g/c/r Abd: soft, NT, ND, +BS in all four quadrants; no HSM, guarding, ridigity, or rebound tenderness Ext: no edema., Skin: no rash or lesions noted Lymph:   Data: Lab Results  Component Value Date   WBC 5.9 08/06/2015   HGB 13.4 08/06/2015   HCT 39.5* 08/06/2015   MCV 89.5 08/06/2015   PLT 136* 08/06/2015    Recent Labs Lab 08/05/15 1716 08/05/15 2132 08/06/15 0532  HGB 14.7 14.2 13.4   Lab Results  Component Value Date   NA 140 08/06/2015   K 3.8 08/06/2015   CL 111 08/06/2015   CO2 26 08/06/2015   BUN 14 08/06/2015   CREATININE 1.05 08/06/2015   Lab Results  Component Value Date   ALT 18 08/05/2015   AST 21 08/05/2015   ALKPHOS 50 08/05/2015   BILITOT 0.8 08/05/2015    Recent Labs Lab 08/06/15 0532  INR 1.28   Assessment/Plan: [name] is a 70 y.o. male with known sigmoid diverticulosis on previous colonoscopy 4 years ago who has been eating more nuts recently and has  Had several bloody bowel movements but appears to have stopped.  His hgb has fallen only a small amount.  Recommendations: Observation, if no further bleeding by tomorrow evening he can likely go home on a liquid to full liquid diet and avoid nuts or seeds or popcorn.  If rebleeding restarts he will need a colonoscopy or angiographic intervention. Thank you for the consult. Please call with questions or concerns.  Leroy Cheers, MD

## 2015-08-06 NOTE — Progress Notes (Signed)
Pt continues to have bloody stools.  Baseline is 3 stools per day, but the blood is new.  Pt receiving IV fluids.  VSS.  Occasional PVCs.

## 2015-08-06 NOTE — Progress Notes (Signed)
Patient ID: Leroy Merritt, male   DOB: 1945-07-25, 70 y.o.   MRN: [id number] [place] PROGRESS NOTE  Leroy Merritt [id number] DOB: 01-08-1946 DOA: 08/05/2015 PCP: Golden Pop, MD  HPI/Subjective: Patient seen this morning. He had 4 episodes of bleeding yesterday prior to presenting to the hospital. In another few episodes here. This morning he had when he thought was a normal bowel movement with few drops of blood. No complaints of nausea or vomiting. No abdominal pain. Feels okay.  Objective: Filed Vitals:   08/06/15 0436 08/06/15 1259  BP: 111/65 117/63  Pulse: 74 82  Temp: 97.8 F (36.6 C) 97.8 F (36.6 C)  Resp: 18 20   No intake or output data in the 24 hours ending 08/06/15 1423 Filed Weights   08/05/15 1659 08/05/15 2117 08/05/15 2125  Weight: 83.915 kg (185 lb) 81.058 kg (178 lb 11.2 oz) 81.466 kg (179 lb 9.6 oz)    ROS: Review of Systems  Constitutional: Negative for fever and chills.  Eyes: Negative for blurred vision.  Respiratory: Negative for cough and shortness of breath.   Cardiovascular: Negative for chest pain.  Gastrointestinal: Positive for blood in stool. Negative for nausea, vomiting, abdominal pain, diarrhea and constipation.  Genitourinary: Negative for dysuria.  Musculoskeletal: Negative for joint pain.  Neurological: Negative for dizziness and headaches.   Exam: Physical Exam  Constitutional: He is oriented to person, place, and time.  HENT:  Nose: No mucosal edema.  Mouth/Throat: No oropharyngeal exudate or posterior oropharyngeal edema.  Eyes: Conjunctivae, EOM and lids are normal. Pupils are equal, round, and reactive to light.  Neck: No JVD present. Carotid bruit is not present. No edema present. No thyroid mass and no thyromegaly present.  Cardiovascular: S1 normal and S2 normal.  Exam reveals no gallop.   No murmur heard. Pulses:      Dorsalis pedis pulses are 2+ on the right side, and 2+ on the left side.  Respiratory: No  respiratory distress. He has no wheezes. He has no rhonchi. He has no rales.  GI: Soft. Bowel sounds are normal. There is no tenderness.  Musculoskeletal:       Right ankle: He exhibits no swelling.       Left ankle: He exhibits no swelling.  Lymphadenopathy:    He has no cervical adenopathy.  Neurological: He is alert and oriented to person, place, and time. No cranial nerve deficit.  Skin: Skin is warm. No rash noted. Nails show no clubbing.  Psychiatric: He has a normal mood and affect.      Data Reviewed: Basic Metabolic Panel:  Recent Labs Lab 08/05/15 1716 08/06/15 0532  NA 138 140  K 3.7 3.8  CL 107 111  CO2 23 26  GLUCOSE 136* 107*  BUN 17 14  CREATININE 1.23 1.05  CALCIUM 8.6* 8.2*   Liver Function Tests:  Recent Labs Lab 08/05/15 1716  AST 21  ALT 18  ALKPHOS 50  BILITOT 0.8  PROT 6.5  ALBUMIN 3.8   CBC:  Recent Labs Lab 08/05/15 1716 08/05/15 2132 08/06/15 0532 08/06/15 1330  WBC 6.6  --  5.9  --   HGB 14.7 14.2 13.4 13.4  HCT 44.2 42.7 39.5* 39.6*  MCV 88.0  --  89.5  --   PLT 152  --  136*  --     Scheduled Meds: . finasteride  5 mg Oral QHS  . pantoprazole (PROTONIX) IV  40 mg Intravenous Q24H  . sodium chloride flush  3 mL Intravenous Q12H  . tamsulosin  0.4 mg Oral Daily   Continuous Infusions: . sodium chloride 30 mL/hr at 08/06/15 0929    Assessment/Plan:  1. Diverticular bleeding. Bright red blood per rectum. So far hemoglobin is relatively stable. If major bleeding occurs can consider a bleeding scan. Monitor the rest of today for signs of bleeding. Earliest potential going home would be tomorrow if no further bleeding. 2. BPH on finasteride and Flomax 3. History of esophageal dilation on PPI  Code Status:     Code Status Orders        Start     Ordered   08/05/15 2129  Full code   Continuous     08/05/15 2128    Code Status History    Date Active Date Inactive Code Status Order ID Comments User Context   This  patient has a current code status but no historical code status.     Family Communication: Wife at the bedside Disposition Plan: Potential home tomorrow if no further bleeding  Consultants:  Gastroenterology  Time spent: 25 minutes  [place], [place]

## 2015-08-07 LAB — CBC
HCT: 38.6 % — ABNORMAL LOW (ref 40.0–52.0)
Hemoglobin: 12.9 g/dL — ABNORMAL LOW (ref 13.0–18.0)
MCH: 29.8 pg (ref 26.0–34.0)
MCHC: 33.5 g/dL (ref 32.0–36.0)
MCV: 89.1 fL (ref 80.0–100.0)
Platelets: 135 K/uL — ABNORMAL LOW (ref 150–440)
RBC: 4.33 MIL/uL — ABNORMAL LOW (ref 4.40–5.90)
RDW: 14.4 % (ref 11.5–14.5)
WBC: 6 K/uL (ref 3.8–10.6)

## 2015-08-07 NOTE — Care Management (Signed)
Discharge to home today per Dr. Leslye Peer. No follow-up needs identified. Family/friends will transport. Shelbie Ammons RN MSN CCM Care Management [phone]

## 2015-08-07 NOTE — Discharge Summary (Signed)
[facility] at [place] NAME: Arth Hedinger    MR#:  [id number]  DATE OF BIRTH:  09-17-1945  DATE OF ADMISSION:  08/05/2015 ADMITTING PHYSICIAN: Quintella Baton, MD  DATE OF DISCHARGE: 08/07/2015  3:02 PM  PRIMARY CARE PHYSICIAN: Golden Pop, MD    ADMISSION DIAGNOSIS:  Acute GI bleeding [K92.2]  DISCHARGE DIAGNOSIS:  Active Problems:   Dysphagia   BPH (benign prostatic hyperplasia)   Rectal bleed   SECONDARY DIAGNOSIS:   Past Medical History  Diagnosis Date  . BPH (benign prostatic hyperplasia)   . 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 Dysphagia 12/20/2014  . Dysrhythmia     OCCASIONAL SKIP A BEAT/ STRESS TEST OK-DR [place] COURSE:   1. Acute diverticular bleeding. Patient had 4 episodes of bleeding prior to coming into the hospital in another 3 in the emergency room. By the time I saw the patient he stated he only saw a few drops of blood. On the day of discharge he did not see any blood. Hemoglobin did drift down a little bit from 14.7-12.9. He may of also battled dehydrated upon presentation to the hospital. He was seen in consultation by Dr. Vira Agar. Dr. Vira Agar reviewed in old colonoscopy which saw diverticuli. Follow-up as outpatient and check hemoglobin. 2. BPH. Continue usual medications 3. History of esophageal dilation on Zantac  DISCHARGE CONDITIONS:   Satisfactory  CONSULTS OBTAINED:  Treatment Team:  Manya Silvas, MD  DRUG ALLERGIES:   Allergies  Allergen Reactions  . Omeprazole Rash    DISCHARGE MEDICATIONS:   Discharge Medication List as of 08/07/2015 12:54 PM    CONTINUE these medications which have NOT CHANGED   Details  finasteride (PROSCAR) 5 MG tablet Take 1 tablet (5 mg total) by mouth at bedtime., Starting 12/20/2014, Until Discontinued, Normal    Multiple Vitamins-Minerals (MULTIVITAMIN ADULT PO) Take 1 tablet by mouth every morning. , Starting 01/11/2015, Until  Discontinued, Historical Med    ranitidine (ZANTAC) 150 MG tablet Take 1 tablet (150 mg total) by mouth 2 (two) times daily., Starting 02/26/2015, Until Discontinued, Normal    tamsulosin (FLOMAX) 0.4 MG CAPS capsule Take 1 capsule (0.4 mg total) by mouth daily., Starting 12/20/2014, Until Discontinued, Normal         DISCHARGE INSTRUCTIONS:   Follow-up PMD one week Follow-up Dr. Vira Agar in a few weeks  If you experience worsening of your admission symptoms, develop shortness of breath, life threatening emergency, suicidal or homicidal thoughts you must seek medical attention immediately by calling 911 or calling your MD immediately  if symptoms less severe.  You Must read complete instructions/literature along with all the possible adverse reactions/side effects for all the Medicines you take and that have been prescribed to you. Take any new Medicines after you have completely understood and accept all the possible adverse reactions/side effects.   Please note  You were cared for by a hospitalist during your hospital stay. If you have any questions about your discharge medications or the care you received while you were in the hospital after you are discharged, you can call the unit and asked to speak with the hospitalist on call if the hospitalist that took care of you is not available. Once you are discharged, your primary care physician will handle any further medical issues. Please note that NO REFILLS for any discharge medications will be authorized once you are discharged, as it is imperative that you return to  your primary care physician (or establish a relationship with a primary care physician if you do not have one) for your aftercare needs so that they can reassess your need for medications and monitor your lab values.    Today   CHIEF COMPLAINT:   Chief Complaint  Patient presents with  . Rectal Bleeding    HISTORY OF PRESENT ILLNESS:  Leroy Merritt  is a 70 y.o. male  presented with rectal bleeding   VITAL SIGNS:  Blood pressure 112/75, pulse 64, temperature 97.7 F (36.5 C), temperature source Oral, resp. rate 18, height 5\' 10"  (1.778 m), weight 81.466 kg (179 lb 9.6 oz), SpO2 100 %.    PHYSICAL EXAMINATION:  GENERAL:  70 y.o.-year-old patient lying in the bed with no acute distress.  EYES: Pupils equal, round, reactive to light and accommodation. No scleral icterus. Extraocular muscles intact.  HEENT: Head atraumatic, normocephalic. Oropharynx and nasopharynx clear.  NECK:  Supple, no jugular venous distention. No thyroid enlargement, no tenderness.  LUNGS: Normal breath sounds bilaterally, no wheezing, rales,rhonchi or crepitation. No use of accessory muscles of respiration.  CARDIOVASCULAR: S1, S2 normal. No murmurs, rubs, or gallops.  ABDOMEN: Soft, non-tender, non-distended. Bowel sounds present. No organomegaly or mass.  EXTREMITIES: No pedal edema, cyanosis, or clubbing.  NEUROLOGIC: Cranial nerves II through XII are intact. Muscle strength 5/5 in all extremities. Sensation intact. Gait not checked.  PSYCHIATRIC: The patient is alert and oriented x 3.  SKIN: No obvious rash, lesion, or ulcer.   DATA REVIEW:   CBC  Recent Labs Lab 08/07/15 0402  WBC 6.0  HGB 12.9*  HCT 38.6*  PLT 135*    Chemistries   Recent Labs Lab 08/05/15 1716 08/06/15 0532  NA 138 140  K 3.7 3.8  CL 107 111  CO2 23 26  GLUCOSE 136* 107*  BUN 17 14  CREATININE 1.23 1.05  CALCIUM 8.6* 8.2*  AST 21  --   ALT 18  --   ALKPHOS 50  --   BILITOT 0.8  --       Management plans discussed with the patient, family and they are in agreement.  CODE STATUS:     Code Status Orders        Start     Ordered   08/05/15 2129  Full code   Continuous     08/05/15 2128    Code Status History    Date Active Date Inactive Code Status Order ID Comments User Context   This patient has a current code status but no historical code status.      TOTAL TIME  TAKING CARE OF THIS PATIENT: 35 minutes.    Loletha Grayer M.D on 08/07/2015 at 5:19 PM  Between 7am to 6pm - Pager - [phone]  After 6pm go to [email or web address] - password [email or web address]  [facility] Office  [phone]  CC: Primary care physician; Golden Pop, MD

## 2015-08-07 NOTE — Discharge Instructions (Signed)
No aspirin or anti-inflammatories such as advil, motrin, ibuprofen, bc powder

## 2015-08-07 NOTE — Progress Notes (Signed)
Pt d/ced home.  Tele and IV removed.  Reviewed d/c instructions w/pt and wife.  Reviewed f/u appts.  Pt admitted for diverticulosis - encouraged pt to not eat foods w/seeds - strawberries, tomatoes- He also sd he'd been told no popcorn, nuts.  Reviewed symptoms of GI bleed - black tarry stools and coffee grounds emesis.  Also call dr if he's feeling weak, dizzy, SOB.  I wheeled him out and he left w/wife.

## 2015-08-07 NOTE — Care Management Important Message (Signed)
Important Message  Patient Details  Name: Leroy Merritt MRN: [id number] Date of Birth: Jun 26, 1945   Medicare Important Message Given:  Yes    Juliann Pulse A Tiasha Helvie 08/07/2015, 10:07 AM

## 2015-08-07 NOTE — Progress Notes (Signed)
Pt requesting advancement in diet.  Per pt, stool yesterday evening did not appear to have blood it only tiny bit of blood on toilet paper.  Per Dr. Leslye Peer my advance to regular diet.  Clarise Cruz, RN

## 2015-08-14 ENCOUNTER — Ambulatory Visit: Payer: Commercial Managed Care - HMO | Admitting: Family Medicine

## 2015-10-01 DIAGNOSIS — K922 Gastrointestinal hemorrhage, unspecified: Secondary | ICD-10-CM | POA: Diagnosis not present

## 2015-10-01 DIAGNOSIS — K5731 Diverticulosis of large intestine without perforation or abscess with bleeding: Secondary | ICD-10-CM | POA: Diagnosis not present

## 2016-02-28 ENCOUNTER — Ambulatory Visit (INDEPENDENT_AMBULATORY_CARE_PROVIDER_SITE_OTHER): Payer: Commercial Managed Care - HMO | Admitting: Family Medicine

## 2016-02-28 ENCOUNTER — Encounter: Payer: Self-pay | Admitting: Family Medicine

## 2016-02-28 VITALS — BP 138/68 | HR 52 | Temp 98.0°F | Ht 69.0 in | Wt 182.1 lb

## 2016-02-28 DIAGNOSIS — K2100 Gastro-esophageal reflux disease with esophagitis, without bleeding: Secondary | ICD-10-CM

## 2016-02-28 DIAGNOSIS — I499 Cardiac arrhythmia, unspecified: Secondary | ICD-10-CM | POA: Diagnosis not present

## 2016-02-28 DIAGNOSIS — N401 Enlarged prostate with lower urinary tract symptoms: Secondary | ICD-10-CM

## 2016-02-28 DIAGNOSIS — I493 Ventricular premature depolarization: Secondary | ICD-10-CM

## 2016-02-28 DIAGNOSIS — K625 Hemorrhage of anus and rectum: Secondary | ICD-10-CM | POA: Diagnosis not present

## 2016-02-28 DIAGNOSIS — R350 Frequency of micturition: Secondary | ICD-10-CM

## 2016-02-28 DIAGNOSIS — I8393 Asymptomatic varicose veins of bilateral lower extremities: Secondary | ICD-10-CM | POA: Diagnosis not present

## 2016-02-28 DIAGNOSIS — K222 Esophageal obstruction: Secondary | ICD-10-CM | POA: Diagnosis not present

## 2016-02-28 DIAGNOSIS — R7309 Other abnormal glucose: Secondary | ICD-10-CM | POA: Diagnosis not present

## 2016-02-28 DIAGNOSIS — R1310 Dysphagia, unspecified: Secondary | ICD-10-CM

## 2016-02-28 DIAGNOSIS — G0439 Other acute necrotizing hemorrhagic encephalopathy: Secondary | ICD-10-CM | POA: Diagnosis not present

## 2016-02-28 LAB — URINALYSIS, ROUTINE W REFLEX MICROSCOPIC
BILIRUBIN UA: NEGATIVE
GLUCOSE, UA: NEGATIVE
KETONES UA: NEGATIVE
Leukocytes, UA: NEGATIVE
NITRITE UA: NEGATIVE
Protein, UA: NEGATIVE
RBC, UA: NEGATIVE
Specific Gravity, UA: <1.005 — ABNORMAL LOW (ref 1.005–1.030)
UUROB: 0.2 mg/dL (ref 0.2–1.0)
pH, UA: 6.5 (ref 5.0–7.5)

## 2016-02-28 MED ORDER — OMEPRAZOLE 20 MG PO CPDR: 20.0000 mg | DELAYED_RELEASE_CAPSULE | Freq: Every day | ORAL | 4 refills | Status: DC

## 2016-02-28 MED ORDER — FINASTERIDE 5 MG PO TABS: 5.0000 mg | ORAL_TABLET | Freq: Every day | ORAL | 4 refills | Status: DC

## 2016-02-28 MED ORDER — TAMSULOSIN HCL 0.4 MG PO CAPS: 0.4000 mg | ORAL_CAPSULE | Freq: Every day | ORAL | 4 refills | Status: DC

## 2016-02-28 NOTE — Assessment & Plan Note (Signed)
The current medical regimen is effective;  continue present plan and medications.  

## 2016-02-28 NOTE — Assessment & Plan Note (Signed)
resolved 

## 2016-02-28 NOTE — Progress Notes (Signed)
BP 138/68 (BP Location: Left Arm)   Pulse (!) 52   Temp 98 F (36.7 C)   Ht '5\' 9"'$  (1.753 m)   Wt 182 lb 1.6 oz (82.6 kg)   SpO2 98%   BMI 26.89 kg/m    Subjective:    Patient ID: Leroy Merritt, male    DOB: [birth year], 70 y.o.   MRN: [id number]  HPI: Leroy Merritt is a 70 y.o. male  Chief Complaint  Patient presents with  . Medicare Wellness  AWV metrics met Patient follow-up doing well a pH symptoms controlled with Proscar and Flomax. Reflux doing okay on from a sec 40 mg every other day is interested in going to Prilosec 20 mg daily or maybe even every other day.  Relevant past medical, surgical, family and social history reviewed and updated as indicated. Interim medical history since our last visit reviewed. Allergies and medications reviewed and updated.  Review of Systems  Constitutional: Negative.   HENT: Negative.   Eyes: Negative.   Respiratory: Negative.   Cardiovascular: Negative.   Gastrointestinal: Negative.   Endocrine: Negative.   Genitourinary: Negative.   Musculoskeletal: Negative.   Skin: Negative.   Allergic/Immunologic: Negative.   Neurological: Negative.   Hematological: Negative.   Psychiatric/Behavioral: Negative.     Per HPI unless specifically indicated above     Objective:    BP 138/68 (BP Location: Left Arm)   Pulse (!) 52   Temp 98 F (36.7 C)   Ht '5\' 9"'$  (1.753 m)   Wt 182 lb 1.6 oz (82.6 kg)   SpO2 98%   BMI 26.89 kg/m   Wt Readings from Last 3 Encounters:  02/28/16 182 lb 1.6 oz (82.6 kg)  08/05/15 179 lb 9.6 oz (81.5 kg)  02/19/15 180 lb (81.6 kg)    Physical Exam  Constitutional: He is oriented to person, place, and time. He appears well-developed and well-nourished.  HENT:  Head: Normocephalic and atraumatic.  Right Ear: External ear normal.  Left Ear: External ear normal.  Eyes: Conjunctivae and EOM are normal. Pupils are equal, round, and reactive to light.  Neck: Normal range of motion. Neck supple.    Cardiovascular: Normal rate, regular rhythm, normal heart sounds and intact distal pulses.   Pulmonary/Chest: Effort normal and breath sounds normal.  Abdominal: Soft. Bowel sounds are normal. There is no splenomegaly or hepatomegaly.  Genitourinary: Rectum normal, prostate normal and penis normal.  Musculoskeletal: Normal range of motion.  Neurological: He is alert and oriented to person, place, and time. He has normal reflexes.  Skin: No rash noted. No erythema.  Psychiatric: He has a normal mood and affect. His behavior is normal. Judgment and thought content normal.    Results for orders placed or performed during the hospital encounter of 08/05/15  Comprehensive metabolic panel  Result Value Ref Range   Sodium 138 135 - 145 mmol/L   Potassium 3.7 3.5 - 5.1 mmol/L   Chloride 107 101 - 111 mmol/L   CO2 23 22 - 32 mmol/L   Glucose, Bld 136 (H) 65 - 99 mg/dL   BUN 17 6 - 20 mg/dL   Creatinine, Ser 1.23 0.61 - 1.24 mg/dL   Calcium 8.6 (L) 8.9 - 10.3 mg/dL   Total Protein 6.5 6.5 - 8.1 g/dL   Albumin 3.8 3.5 - 5.0 g/dL   AST 21 15 - 41 U/L   ALT 18 17 - 63 U/L   Alkaline Phosphatase 50 38 - 126 U/L  Total Bilirubin 0.8 0.3 - 1.2 mg/dL   GFR calc non Af Amer 57 (L) >60 mL/min   GFR calc Af Amer >60 >60 mL/min   Anion gap 8 5 - 15  CBC  Result Value Ref Range   WBC 6.6 3.8 - 10.6 K/uL   RBC 5.03 4.40 - 5.90 MIL/uL   Hemoglobin 14.7 13.0 - 18.0 g/dL   HCT 44.2 40.0 - 52.0 %   MCV 88.0 80.0 - 100.0 fL   MCH 29.2 26.0 - 34.0 pg   MCHC 33.1 32.0 - 36.0 g/dL   RDW 14.9 (H) 11.5 - 14.5 %   Platelets 152 150 - 440 K/uL  Basic metabolic panel  Result Value Ref Range   Sodium 140 135 - 145 mmol/L   Potassium 3.8 3.5 - 5.1 mmol/L   Chloride 111 101 - 111 mmol/L   CO2 26 22 - 32 mmol/L   Glucose, Bld 107 (H) 65 - 99 mg/dL   BUN 14 6 - 20 mg/dL   Creatinine, Ser 1.05 0.61 - 1.24 mg/dL   Calcium 8.2 (L) 8.9 - 10.3 mg/dL   GFR calc non Af Amer >60 >60 mL/min   GFR calc Af Amer  >60 >60 mL/min   Anion gap 3 (L) 5 - 15  CBC  Result Value Ref Range   WBC 5.9 3.8 - 10.6 K/uL   RBC 4.41 4.40 - 5.90 MIL/uL   Hemoglobin 13.4 13.0 - 18.0 g/dL   HCT 39.5 (L) 40.0 - 52.0 %   MCV 89.5 80.0 - 100.0 fL   MCH 30.3 26.0 - 34.0 pg   MCHC 33.9 32.0 - 36.0 g/dL   RDW 14.4 11.5 - 14.5 %   Platelets 136 (L) 150 - 440 K/uL  Protime-INR  Result Value Ref Range   Prothrombin Time 16.1 (H) 11.4 - 15.0 seconds   INR 1.28   Hemoglobin and hematocrit, blood  Result Value Ref Range   Hemoglobin 14.2 13.0 - 18.0 g/dL   HCT 42.7 40.0 - 52.0 %  Hemoglobin and hematocrit, blood  Result Value Ref Range   Hemoglobin 13.4 13.0 - 18.0 g/dL   HCT 39.6 (L) 40.0 - 52.0 %  CBC  Result Value Ref Range   WBC 6.0 3.8 - 10.6 K/uL   RBC 4.33 (L) 4.40 - 5.90 MIL/uL   Hemoglobin 12.9 (L) 13.0 - 18.0 g/dL   HCT 38.6 (L) 40.0 - 52.0 %   MCV 89.1 80.0 - 100.0 fL   MCH 29.8 26.0 - 34.0 pg   MCHC 33.5 32.0 - 36.0 g/dL   RDW 14.4 11.5 - 14.5 %   Platelets 135 (L) 150 - 440 K/uL  Type and screen [facility]  Result Value Ref Range   ABO/RH(D) A NEG    Antibody Screen NEG    Sample Expiration 08/08/2015   ABO/Rh  Result Value Ref Range   ABO/RH(D) A NEG       Assessment & Plan:   Problem List Items Addressed This Visit      Cardiovascular and Mediastinum   Varicose veins of both lower extremities   Dysrhythmia     Digestive   Reflux esophagitis   Relevant Medications   omeprazole (PRILOSEC) 20 MG capsule   Other Relevant Orders   Comprehensive metabolic panel   Stricture and stenosis of esophagus    The current medical regimen is effective;  continue present plan and medications.       Rectal bleed  resolved      Relevant Orders   CBC with Differential/Platelet   TSH   RESOLVED: Dysphagia   Relevant Orders   TSH     Genitourinary   BPH (benign prostatic hyperplasia) - Primary    The current medical regimen is effective;  continue present plan  and medications.       Relevant Medications   tamsulosin (FLOMAX) 0.4 MG CAPS capsule   finasteride (PROSCAR) 5 MG tablet   Other Relevant Orders   Comprehensive metabolic panel   TSH   Urinalysis, Routine w reflex microscopic   PSA    Other Visit Diagnoses    PVC (premature ventricular contraction)       Relevant Orders   Lipid panel       Follow up plan: Return in about 1 year (around 02/27/2017) for Physical Exam.

## 2016-02-29 ENCOUNTER — Encounter: Payer: Self-pay | Admitting: Family Medicine

## 2016-02-29 LAB — COMPREHENSIVE METABOLIC PANEL
ALBUMIN: 4 g/dL (ref 3.5–4.8)
ALK PHOS: 76 IU/L (ref 39–117)
ALT: 16 IU/L (ref 0–44)
AST: 16 IU/L (ref 0–40)
Albumin/Globulin Ratio: 1.4 (ref 1.2–2.2)
BILIRUBIN TOTAL: 0.4 mg/dL (ref 0.0–1.2)
BUN / CREAT RATIO: 15 (ref 10–24)
BUN: 16 mg/dL (ref 8–27)
CHLORIDE: 102 mmol/L (ref 96–106)
CO2: 24 mmol/L (ref 18–29)
Calcium: 9.3 mg/dL (ref 8.6–10.2)
Creatinine, Ser: 1.1 mg/dL (ref 0.76–1.27)
GFR calc Af Amer: 77 mL/min/{1.73_m2} (ref >59)
GFR calc non Af Amer: 67 mL/min/{1.73_m2} (ref >59)
GLUCOSE: 109 mg/dL — AB (ref 65–99)
Globulin, Total: 2.8 g/dL (ref 1.5–4.5)
Potassium: 3.7 mmol/L (ref 3.5–5.2)
Sodium: 142 mmol/L (ref 134–144)
Total Protein: 6.8 g/dL (ref 6.0–8.5)

## 2016-02-29 LAB — CBC WITH DIFFERENTIAL/PLATELET
BASOS: 0 %
Basophils Absolute: 0 10*3/uL (ref 0.0–0.2)
EOS (ABSOLUTE): 0.2 10*3/uL (ref 0.0–0.4)
EOS: 2 %
HEMATOCRIT: 45.4 % (ref 37.5–51.0)
Hemoglobin: 15.7 g/dL (ref 13.0–17.7)
IMMATURE GRANS (ABS): 0 10*3/uL (ref 0.0–0.1)
IMMATURE GRANULOCYTES: 0 %
LYMPHS: 26 %
Lymphocytes Absolute: 1.9 10*3/uL (ref 0.7–3.1)
MCH: 29.6 pg (ref 26.6–33.0)
MCHC: 34.6 g/dL (ref 31.5–35.7)
MCV: 86 fL (ref 79–97)
MONOS ABS: 0.7 10*3/uL (ref 0.1–0.9)
Monocytes: 9 %
NEUTROS PCT: 63 %
Neutrophils Absolute: 4.7 10*3/uL (ref 1.4–7.0)
Platelets: 178 10*3/uL (ref 150–379)
RBC: 5.31 x10E6/uL (ref 4.14–5.80)
RDW: 14.4 % (ref 12.3–15.4)
WBC: 7.5 10*3/uL (ref 3.4–10.8)

## 2016-02-29 LAB — LIPID PANEL
CHOLESTEROL TOTAL: 143 mg/dL (ref 100–199)
Chol/HDL Ratio: 2.9 ratio units (ref 0.0–5.0)
HDL: 49 mg/dL (ref >39)
LDL Calculated: 74 mg/dL (ref 0–99)
Triglycerides: 102 mg/dL (ref 0–149)
VLDL CHOLESTEROL CAL: 20 mg/dL (ref 5–40)

## 2016-02-29 LAB — TSH: TSH: 1.26 u[IU]/mL (ref 0.450–4.500)

## 2016-02-29 LAB — PSA: Prostate Specific Ag, Serum: 1.4 ng/mL (ref 0.0–4.0)

## 2016-03-11 ENCOUNTER — Encounter: Payer: Commercial Managed Care - HMO | Admitting: Family Medicine

## 2016-03-18 ENCOUNTER — Encounter: Payer: Commercial Managed Care - HMO | Admitting: Family Medicine

## 2016-04-04 ENCOUNTER — Other Ambulatory Visit: Payer: Self-pay | Admitting: Family Medicine

## 2016-04-04 MED ORDER — OSELTAMIVIR PHOSPHATE 75 MG PO CAPS: 75.0000 mg | ORAL_CAPSULE | Freq: Every day | ORAL | 0 refills | Status: DC

## 2016-04-04 NOTE — Telephone Encounter (Signed)
Tamiflu prophylactic dose sent to [facility]

## 2016-04-04 NOTE — Telephone Encounter (Signed)
Patient called because his daughter and two grandchildren have the flu and he is hoping someone can call him in a script for Tamiflu as a precautionary   [facility]  [place] [phone]  Thanks

## 2016-04-04 NOTE — Telephone Encounter (Signed)
Please advise 

## 2016-06-16 ENCOUNTER — Telehealth: Payer: Self-pay

## 2016-06-16 NOTE — Telephone Encounter (Signed)
Attempted to reach to schedule for Medicare Wellness. Message left for pt to return call.   

## 2017-02-28 ENCOUNTER — Other Ambulatory Visit: Payer: Self-pay | Admitting: Family Medicine

## 2017-02-28 DIAGNOSIS — R350 Frequency of micturition: Principal | ICD-10-CM

## 2017-02-28 DIAGNOSIS — K2100 Gastro-esophageal reflux disease with esophagitis, without bleeding: Secondary | ICD-10-CM

## 2017-02-28 DIAGNOSIS — N401 Enlarged prostate with lower urinary tract symptoms: Secondary | ICD-10-CM

## 2017-03-02 NOTE — Telephone Encounter (Signed)
Contacted pt and he requested that we cancel this refill request;

## 2017-03-16 ENCOUNTER — Ambulatory Visit (INDEPENDENT_AMBULATORY_CARE_PROVIDER_SITE_OTHER): Payer: Medicare HMO

## 2017-03-16 VITALS — BP 138/82 | HR 58 | Temp 98.3°F | Resp 16 | Ht 71.0 in | Wt 184.3 lb

## 2017-03-16 DIAGNOSIS — Z23 Encounter for immunization: Secondary | ICD-10-CM | POA: Diagnosis not present

## 2017-03-16 DIAGNOSIS — Z1159 Encounter for screening for other viral diseases: Secondary | ICD-10-CM | POA: Diagnosis not present

## 2017-03-16 DIAGNOSIS — Z0000 Encounter for general adult medical examination without abnormal findings: Secondary | ICD-10-CM | POA: Diagnosis not present

## 2017-03-16 NOTE — Patient Instructions (Addendum)
Leroy Merritt , Thank you for taking time to come for your Medicare Wellness Visit. I appreciate your ongoing commitment to your health goals. Please review the following plan we discussed and let me know if I can assist you in the future.   Screening recommendations/referrals: Colonoscopy: completed 01/02/2012  Recommended yearly ophthalmology/optometry visit for glaucoma screening and checkup Recommended yearly dental visit for hygiene and checkup  Vaccinations: Influenza vaccine: done today Pneumococcal vaccine: pneumovax 23 due- declined Tdap vaccine: up to date Shingles vaccine: up to date, check with your insurance company for coverage   Advanced directives: Please bring a copy of your health care power of attorney and living will to the office at your convenience.  Conditions/risks identified: Recommend drinking at least 6-8 glasses of water a day  Next appointment: Follow up on 03/17/2017 at 9:00am with [place].Follow up in one year for your annual wellness exam.   Preventive Care 65 Years and Older, Male Preventive care refers to lifestyle choices and visits with your health care provider that can promote health and wellness. What does preventive care include?  A yearly physical exam. This is also called an annual well check.  Dental exams once or twice a year.  Routine eye exams. Ask your health care provider how often you should have your eyes checked.  Personal lifestyle choices, including:  Daily care of your teeth and gums.  Regular physical activity.  Eating a healthy diet.  Avoiding tobacco and drug use.  Limiting alcohol use.  Practicing safe sex.  Taking low doses of aspirin every day.  Taking vitamin and mineral supplements as recommended by your health care provider. What happens during an annual well check? The services and screenings done by your health care provider during your annual well check will depend on your age, overall health, lifestyle  risk factors, and family history of disease. Counseling  Your health care provider may ask you questions about your:  Alcohol use.  Tobacco use.  Drug use.  Emotional well-being.  Home and relationship well-being.  Sexual activity.  Eating habits.  History of falls.  Memory and ability to understand (cognition).  Work and work Statistician. Screening  You may have the following tests or measurements:  Height, weight, and BMI.  Blood pressure.  Lipid and cholesterol levels. These may be checked every 5 years, or more frequently if you are over 74 years old.  Skin check.  Lung cancer screening. You may have this screening every year starting at age 744 if you have a 30-pack-year history of smoking and currently smoke or have quit within the past 15 years.  Fecal occult blood test (FOBT) of the stool. You may have this test every year starting at age 744.  Flexible sigmoidoscopy or colonoscopy. You may have a sigmoidoscopy every 5 years or a colonoscopy every 10 years starting at age 74.  Prostate cancer screening. Recommendations will vary depending on your family history and other risks.  Hepatitis C blood test.  Hepatitis B blood test.  Sexually transmitted disease (STD) testing.  Diabetes screening. This is done by checking your blood sugar (glucose) after you have not eaten for a while (fasting). You may have this done every 1-3 years.  Abdominal aortic aneurysm (AAA) screening. You may need this if you are a current or former smoker.  Osteoporosis. You may be screened starting at age 744 if you are at high risk. Talk with your health care provider about your test results, treatment options, and if necessary,  the need for more tests. Vaccines  Your health care provider may recommend certain vaccines, such as:  Influenza vaccine. This is recommended every year.  Tetanus, diphtheria, and acellular pertussis (Tdap, Td) vaccine. You may need a Td booster every 10  years.  Zoster vaccine. You may need this after age 74.  Pneumococcal 13-valent conjugate (PCV13) vaccine. One dose is recommended after age 74.  Pneumococcal polysaccharide (PPSV23) vaccine. One dose is recommended after age 74. Talk to your health care provider about which screenings and vaccines you need and how often you need them. This information is not intended to replace advice given to you by your health care provider. Make sure you discuss any questions you have with your health care provider. Document Released: 03/23/2015 Document Revised: 11/14/2015 Document Reviewed: 12/26/2014 Elsevier Interactive Patient Education  2017 [place] Prevention in the Home Falls can cause injuries. They can happen to people of all ages. There are many things you can do to make your home safe and to help prevent falls. What can I do on the outside of my home?  Regularly fix the edges of walkways and driveways and fix any cracks.  Remove anything that might make you trip as you walk through a door, such as a raised step or threshold.  Trim any bushes or trees on the path to your home.  Use bright outdoor lighting.  Clear any walking paths of anything that might make someone trip, such as rocks or tools.  Regularly check to see if handrails are loose or broken. Make sure that both sides of any steps have handrails.  Any raised decks and porches should have guardrails on the edges.  Have any leaves, snow, or ice cleared regularly.  Use sand or salt on walking paths during winter.  Clean up any spills in your garage right away. This includes oil or grease spills. What can I do in the bathroom?  Use night lights.  Install grab bars by the toilet and in the tub and shower. Do not use towel bars as grab bars.  Use non-skid mats or decals in the tub or shower.  If you need to sit down in the shower, use a plastic, non-slip stool.  Keep the floor dry. Clean up any water that  spills on the floor as soon as it happens.  Remove soap buildup in the tub or shower regularly.  Attach bath mats securely with double-sided non-slip rug tape.  Do not have throw rugs and other things on the floor that can make you trip. What can I do in the bedroom?  Use night lights.  Make sure that you have a light by your bed that is easy to reach.  Do not use any sheets or blankets that are too big for your bed. They should not hang down onto the floor.  Have a firm chair that has side arms. You can use this for support while you get dressed.  Do not have throw rugs and other things on the floor that can make you trip. What can I do in the kitchen?  Clean up any spills right away.  Avoid walking on wet floors.  Keep items that you use a lot in easy-to-reach places.  If you need to reach something above you, use a strong step stool that has a grab bar.  Keep electrical cords out of the way.  Do not use floor polish or wax that makes floors slippery. If you must use  wax, use non-skid floor wax.  Do not have throw rugs and other things on the floor that can make you trip. What can I do with my stairs?  Do not leave any items on the stairs.  Make sure that there are handrails on both sides of the stairs and use them. Fix handrails that are broken or loose. Make sure that handrails are as long as the stairways.  Check any carpeting to make sure that it is firmly attached to the stairs. Fix any carpet that is loose or worn.  Avoid having throw rugs at the top or bottom of the stairs. If you do have throw rugs, attach them to the floor with carpet tape.  Make sure that you have a light switch at the top of the stairs and the bottom of the stairs. If you do not have them, ask someone to add them for you. What else can I do to help prevent falls?  Wear shoes that:  Do not have high heels.  Have rubber bottoms.  Are comfortable and fit you well.  Are closed at the  toe. Do not wear sandals.  If you use a stepladder:  Make sure that it is fully opened. Do not climb a closed stepladder.  Make sure that both sides of the stepladder are locked into place.  Ask someone to hold it for you, if possible.  Clearly mark and make sure that you can see:  Any grab bars or handrails.  First and last steps.  Where the edge of each step is.  Use tools that help you move around (mobility aids) if they are needed. These include:  Canes.  Walkers.  Scooters.  Crutches.  Turn on the lights when you go into a dark area. Replace any light bulbs as soon as they burn out.  Set up your furniture so you have a clear path. Avoid moving your furniture around.  If any of your floors are uneven, fix them.  If there are any pets around you, be aware of where they are.  Review your medicines with your doctor. Some medicines can make you feel dizzy. This can increase your chance of falling. Ask your doctor what other things that you can do to help prevent falls. This information is not intended to replace advice given to you by your health care provider. Make sure you discuss any questions you have with your health care provider. Document Released: 12/21/2008 Document Revised: 08/02/2015 Document Reviewed: 03/31/2014 Elsevier Interactive Patient Education  2017 [place].  Influenza (Flu) Vaccine (Inactivated or Recombinant): What You Need to Know 1. Why get vaccinated? Influenza ("flu") is a contagious disease that spreads around the [place] every year, usually between October and May. Flu is caused by influenza viruses, and is spread mainly by coughing, sneezing, and close contact. Anyone can get flu. Flu strikes suddenly and can last several days. Symptoms vary by age, but can include:  fever/chills  sore throat  muscle aches  fatigue  cough  headache  runny or stuffy nose  Flu can also lead to pneumonia and blood infections, and cause  diarrhea and seizures in children. If you have a medical condition, such as heart or lung disease, flu can make it worse. Flu is more dangerous for some people. Infants and young children, people 110 years of age and older, pregnant women, and people with certain health conditions or a weakened immune system are at greatest risk. Each year thousands of people in  the [place] die from flu, and many more are hospitalized. Flu vaccine can:  keep you from getting flu,  make flu less severe if you do get it, and  keep you from spreading flu to your family and other people. 2. Inactivated and recombinant flu vaccines A dose of flu vaccine is recommended every flu season. Children 6 months through 48 years of age may need two doses during the same flu season. Everyone else needs only one dose each flu season. Some inactivated flu vaccines contain a very small amount of a mercury-based preservative called thimerosal. Studies have not shown thimerosal in vaccines to be harmful, but flu vaccines that do not contain thimerosal are available. There is no live flu virus in flu shots. They cannot cause the flu. There are many flu viruses, and they are always changing. Each year a new flu vaccine is made to protect against three or four viruses that are likely to cause disease in the upcoming flu season. But even when the vaccine doesn't exactly match these viruses, it may still provide some protection. Flu vaccine cannot prevent:  flu that is caused by a virus not covered by the vaccine, or  illnesses that look like flu but are not.  It takes about 2 weeks for protection to develop after vaccination, and protection lasts through the flu season. 3. Some people should not get this vaccine Tell the person who is giving you the vaccine:  If you have any severe, life-threatening allergies. If you ever had a life-threatening allergic reaction after a dose of flu vaccine, or have a severe allergy to any part  of this vaccine, you may be advised not to get vaccinated. Most, but not all, types of flu vaccine contain a small amount of egg protein.  If you ever had Guillain-Barr Syndrome (also called GBS). Some people with a history of GBS should not get this vaccine. This should be discussed with your doctor.  If you are not feeling well. It is usually okay to get flu vaccine when you have a mild illness, but you might be asked to come back when you feel better.  4. Risks of a vaccine reaction With any medicine, including vaccines, there is a chance of reactions. These are usually mild and go away on their own, but serious reactions are also possible. Most people who get a flu shot do not have any problems with it. Minor problems following a flu shot include:  soreness, redness, or swelling where the shot was given  hoarseness  sore, red or itchy eyes  cough  fever  aches  headache  itching  fatigue  If these problems occur, they usually begin soon after the shot and last 1 or 2 days. More serious problems following a flu shot can include the following:  There may be a small increased risk of Guillain-Barre Syndrome (GBS) after inactivated flu vaccine. This risk has been estimated at 1 or 2 additional cases per million people vaccinated. This is much lower than the risk of severe complications from flu, which can be prevented by flu vaccine.  Young children who get the flu shot along with pneumococcal vaccine (PCV13) and/or DTaP vaccine at the same time might be slightly more likely to have a seizure caused by fever. Ask your doctor for more information. Tell your doctor if a child who is getting flu vaccine has ever had a seizure.  Problems that could happen after any injected vaccine:  People sometimes faint after  a medical procedure, including vaccination. Sitting or lying down for about 15 minutes can help prevent fainting, and injuries caused by a fall. Tell your doctor if you  feel dizzy, or have vision changes or ringing in the ears.  Some people get severe pain in the shoulder and have difficulty moving the arm where a shot was given. This happens very rarely.  Any medication can cause a severe allergic reaction. Such reactions from a vaccine are very rare, estimated at about 1 in a million doses, and would happen within a few minutes to a few hours after the vaccination. As with any medicine, there is a very remote chance of a vaccine causing a serious injury or death. The safety of vaccines is always being monitored. For more information, visit: http://www.aguilar.org/ 5. What if there is a serious reaction? What should I look for? Look for anything that concerns you, such as signs of a severe allergic reaction, very high fever, or unusual behavior. Signs of a severe allergic reaction can include hives, swelling of the face and throat, difficulty breathing, a fast heartbeat, dizziness, and weakness. These would start a few minutes to a few hours after the vaccination. What should I do?  If you think it is a severe allergic reaction or other emergency that can't wait, call 9-1-1 and get the person to the nearest hospital. Otherwise, call your doctor.  Reactions should be reported to the Vaccine Adverse Event Reporting System (VAERS). Your doctor should file this report, or you can do it yourself through the VAERS web site at www.vaers.SamedayNews.es, or by calling (279)040-6838. ? VAERS does not give medical advice. 6. The National Vaccine Injury Compensation Program The [employer] Vaccine Injury Compensation Program (VICP) is a federal program that was created to compensate people who may have been injured by certain vaccines. Persons who believe they may have been injured by a vaccine can learn about the program and about filing a claim by calling 213-078-7314 or visiting the [place] website at GoldCloset.com.ee. There is a time limit to file a claim for  compensation. 7. How can I learn more?  Ask your healthcare provider. He or she can give you the vaccine package insert or suggest other sources of information.  Call your local or state health department.  Contact the Centers for Disease Control and Prevention (CDC): ? Call 404-620-2167 (1-800-CDC-INFO) or ? Visit CDC's website at https://gibson.com/ Vaccine Information Statement, Inactivated Influenza Vaccine (10/14/2013) This information is not intended to replace advice given to you by your health care provider. Make sure you discuss any questions you have with your health care provider. Document Released: 12/19/2005 Document Revised: 11/15/2015 Document Reviewed: 11/15/2015 Elsevier Interactive Patient Education  2017 [employer].

## 2017-03-16 NOTE — Progress Notes (Signed)
Subjective:   Leroy Merritt is a 72 y.o. male who presents for Medicare Annual/Subsequent preventive examination.  Review of Systems:  Cardiac Risk Factors include: male gender;advanced age (>14men, >27 women)     Objective:    Vitals: BP 138/82 (BP Location: Left Arm, Patient Position: Sitting)   Pulse (!) 58   Temp 98.3 F (36.8 C) (Oral)   Resp 16   Ht 5\' 11"  (1.803 m)   Wt 184 lb 4.8 oz (83.6 kg)   BMI 25.70 kg/m   Body mass index is 25.7 kg/m.  Advanced Directives 03/16/2017 08/05/2015 02/19/2015  Does Patient Have a Medical Advance Directive? Yes No No  Type of Paramedic of [place];Living will - -  Copy of [place] in Chart? No - copy requested - -  Would patient like information on creating a medical advance directive? - No - patient declined information Yes Higher education careers adviser given    Tobacco Social History   Tobacco Use  Smoking Status Never Smoker  Smokeless Tobacco Never Used     Counseling given: Not Answered   Clinical Intake:  Pre-visit preparation completed: Yes  Pain : No/denies pain     Nutritional Status: BMI 25 -29 Overweight Nutritional Risks: None Diabetes: No  How often do you need to have someone help you when you read instructions, pamphlets, or other written materials from your doctor or pharmacy?: 1 - Never What is the last grade level you completed in school?: college   Interpreter Needed?: No  Information entered by :: Tiffany HIll,LPN   Past Medical History:  Diagnosis Date  . BPH (benign prostatic hyperplasia)   . Diverticulosis 2017  . Dysphagia 12/20/2014  . Dysrhythmia    OCCASIONAL SKIP A BEAT/ STRESS TEST OK-DR KOWALSKI  . 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 ESOPHAGOGASTRODUODENOSCOPY (EGD) WITH PROPOFOL N/A 02/19/2015   Procedure: ESOPHAGOGASTRODUODENOSCOPY (EGD) WITH PROPOFOL and esophageal  dilation.;  Surgeon: Lucilla Lame, MD;  Location: [facility];  Service: Endoscopy;  Laterality: N/A;  . EYE SURGERY Right    cataract surgery 2017   Family History  Problem Relation Age of Onset  . Arthritis Mother   . Diabetes Father   . Heart disease Brother   . Hyperlipidemia Brother   . Hypertension Brother   . Lung cancer Daughter    Social History   Socioeconomic History  . Marital status: Married    Spouse name: None  . Number of children: None  . Years of education: college  . Highest education level: None  Social Needs  . Financial resource strain: Not hard at all  . Food insecurity - worry: Never true  . Food insecurity - inability: Never true  . Transportation needs - medical: No  . Transportation needs - non-medical: No  Occupational History  . None  Tobacco Use  . Smoking status: Never Smoker  . Smokeless tobacco: Never Used  Substance and Sexual Activity  . Alcohol use: Yes    Alcohol/week: 0.6 oz    Types: 1 Glasses of wine per week  . Drug use: No  . Sexual activity: None  Other Topics Concern  . None  Social History Narrative  . None    Outpatient Encounter Medications as of 03/16/2017  Medication Sig  . finasteride (PROSCAR) 5 MG tablet Take 1 tablet (5 mg total) by mouth at bedtime.  . Multiple Vitamins-Minerals (MULTIVITAMIN ADULT  PO) Take 1 tablet by mouth every morning.   [name] omeprazole (PRILOSEC) 20 MG capsule Take 1 capsule (20 mg total) by mouth daily.  . tamsulosin (FLOMAX) 0.4 MG CAPS capsule Take 1 capsule (0.4 mg total) by mouth daily.  . [DISCONTINUED] oseltamivir (TAMIFLU) 75 MG capsule Take 1 capsule (75 mg total) by mouth daily. (Patient not taking: Reported on 03/16/2017)   No facility-administered encounter medications on file as of 03/16/2017.     Activities of Daily Living In your present state of health, do you have any difficulty performing the following activities: 03/16/2017  Hearing? Y  Vision? N  Difficulty  concentrating or making decisions? N  Walking or climbing stairs? N  Dressing or bathing? N  Doing errands, shopping? N  Preparing Food and eating ? N  Using the Toilet? N  In the past six months, have you accidently leaked urine? N  Do you have problems with loss of bowel control? N  Managing your Medications? N  Managing your Finances? N  Housekeeping or managing your Housekeeping? N  Some recent data might be hidden    Patient Care Team: [name], MD as PCP - General (Family Medicine) Manya Silvas, MD (Gastroenterology)   Assessment:   This is a routine wellness examination for [facility]  Exercise Activities and Dietary recommendations Current Exercise Habits: The patient does not participate in regular exercise at present, Exercise limited by: None identified  Goals    . DIET - INCREASE WATER INTAKE     Recommend drinking at least 6-8 glasses of water a day       Fall Risk Fall Risk  03/16/2017 02/28/2016 12/20/2014  Falls in the past year? No No No   Is the patient's home free of loose throw rugs in walkways, pet beds, electrical cords, etc?   yes      Grab bars in the bathroom? No       Handrails on the stairs?   yes      Adequate lighting?   yes  Timed Get Up and Go Performed: completed in 8 seconds with no use of assistive devices, steady gait. No intervention need at this time.   Depression Screen PHQ 2/9 Scores 03/16/2017 02/28/2016 12/20/2014  PHQ - 2 Score 0 0 0    Cognitive Function     6CIT Screen 03/16/2017  What Year? 0 points  What month? 0 points  What time? 0 points  Count back from 20 0 points  Months in reverse 0 points  Repeat phrase 0 points  Total Score 0    Immunization History  Administered Date(s) Administered  . Influenza, High Dose Seasonal PF 03/16/2017  . Pneumococcal Conjugate-13 12/15/2013  . Tdap 08/25/2011  . Zoster 08/25/2011    Qualifies for Shingles Vaccine? Discussed option for shinrix vaccine  Screening  Tests Health Maintenance  Topic Date Due  . Hepatitis C Screening  1946/01/27  . PNA vac Low Risk Adult (2 of 2 - PPSV23) 03/16/2018 (Originally 12/16/2014)  . TETANUS/TDAP  08/24/2021  . COLONOSCOPY  01/01/2022  . INFLUENZA VACCINE  Completed   Cancer Screenings: Lung: Low Dose CT Chest recommended if Age 65-80 years, 30 pack-year currently smoking OR have quit w/in 15years. Patient does not qualify. Colorectal: completed 01/02/2012, requested results  Additional Screenings:  Hepatitis B/HIV/Syphillis: not indicated  Hepatitis C Screening: ordered for tomorrow     Plan:    I have personally reviewed and addressed the Medicare Annual Wellness questionnaire and have  noted the following in the patient's chart:  A. Medical and social history B. Use of alcohol, tobacco or illicit drugs  C. Current medications and supplements D. Functional ability and status E.  Nutritional status F.  Physical activity G. Advance directives H. List of other physicians I.  Hospitalizations, surgeries, and ER visits in previous 12 months J.  [place] such as hearing and vision if needed, cognitive and depression L. Referrals and appointments   In addition, I have reviewed and discussed with patient certain preventive protocols, quality metrics, and best practice recommendations. A written personalized care plan for preventive services as well as general preventive health recommendations were provided to patient.   Signed,  Tyler Aas, LPN Nurse Health Advisor   Nurse Notes: none

## 2017-03-17 ENCOUNTER — Encounter: Payer: Self-pay | Admitting: Family Medicine

## 2017-03-17 ENCOUNTER — Other Ambulatory Visit: Payer: Self-pay | Admitting: Family Medicine

## 2017-03-17 ENCOUNTER — Ambulatory Visit (INDEPENDENT_AMBULATORY_CARE_PROVIDER_SITE_OTHER): Payer: Medicare HMO | Admitting: Family Medicine

## 2017-03-17 VITALS — BP 138/84 | HR 60 | Ht 71.0 in | Wt 184.0 lb

## 2017-03-17 DIAGNOSIS — Z1159 Encounter for screening for other viral diseases: Secondary | ICD-10-CM

## 2017-03-17 DIAGNOSIS — Z7189 Other specified counseling: Secondary | ICD-10-CM

## 2017-03-17 DIAGNOSIS — Z0001 Encounter for general adult medical examination with abnormal findings: Secondary | ICD-10-CM | POA: Diagnosis not present

## 2017-03-17 DIAGNOSIS — K2100 Gastro-esophageal reflux disease with esophagitis, without bleeding: Secondary | ICD-10-CM

## 2017-03-17 DIAGNOSIS — N400 Enlarged prostate without lower urinary tract symptoms: Secondary | ICD-10-CM | POA: Diagnosis not present

## 2017-03-17 DIAGNOSIS — I8393 Asymptomatic varicose veins of bilateral lower extremities: Secondary | ICD-10-CM | POA: Diagnosis not present

## 2017-03-17 DIAGNOSIS — R002 Palpitations: Secondary | ICD-10-CM | POA: Insufficient documentation

## 2017-03-17 DIAGNOSIS — R350 Frequency of micturition: Secondary | ICD-10-CM

## 2017-03-17 DIAGNOSIS — Z0000 Encounter for general adult medical examination without abnormal findings: Secondary | ICD-10-CM

## 2017-03-17 DIAGNOSIS — N401 Enlarged prostate with lower urinary tract symptoms: Secondary | ICD-10-CM | POA: Diagnosis not present

## 2017-03-17 DIAGNOSIS — K222 Esophageal obstruction: Secondary | ICD-10-CM | POA: Diagnosis not present

## 2017-03-17 LAB — URINALYSIS, ROUTINE W REFLEX MICROSCOPIC
Bilirubin, UA: NEGATIVE
GLUCOSE, UA: NEGATIVE
KETONES UA: NEGATIVE
Nitrite, UA: NEGATIVE
PROTEIN UA: NEGATIVE
RBC, UA: NEGATIVE
Urobilinogen, Ur: 0.2 mg/dL (ref 0.2–1.0)
pH, UA: 6.5 (ref 5.0–7.5)

## 2017-03-17 LAB — MICROSCOPIC EXAMINATION: Bacteria, UA: NONE SEEN

## 2017-03-17 MED ORDER — OMEPRAZOLE 20 MG PO CPDR: 20.0000 mg | DELAYED_RELEASE_CAPSULE | Freq: Every day | ORAL | 4 refills | Status: DC

## 2017-03-17 MED ORDER — TAMSULOSIN HCL 0.4 MG PO CAPS: 0.4000 mg | ORAL_CAPSULE | Freq: Every day | ORAL | 4 refills | Status: DC

## 2017-03-17 MED ORDER — FINASTERIDE 5 MG PO TABS: 5.0000 mg | ORAL_TABLET | Freq: Every day | ORAL | 4 refills | Status: DC

## 2017-03-17 NOTE — Progress Notes (Signed)
BP 138/84 (BP Location: Left Arm)   Pulse 60   Ht 5\' 11"  (1.803 m)   Wt 184 lb (83.5 kg)   SpO2 99%   BMI 25.66 kg/m    Subjective:    Patient ID: Leroy Merritt, male    DOB: 1945-06-12, 72 y.o.   MRN: [id number]  HPI: Leroy Merritt is a 72 y.o. male  Chief Complaint  Patient presents with  . Annual Exam    Saw [facility] yesterday  BPH well controlled with medications no problems Reflux and esophageal stenosis symptoms resolved with taking Prilosec mostly every other day. The varicose veins stable sometimes thinks about wearing support hose. Reviewed palpitations seem to be somewhat better reviewed old cardiology notes from 2013 and showing some global hypokinesis.  With continued palpitations and history of hypokinesis will refer back to cardiology.  Relevant past medical, surgical, family and social history reviewed and updated as indicated. Interim medical history since our last visit reviewed. Allergies and medications reviewed and updated.  Review of Systems  Constitutional: Negative.   HENT: Negative.   Eyes: Negative.   Respiratory: Negative.   Cardiovascular: Negative.   Gastrointestinal: Negative.   Endocrine: Negative.   Genitourinary: Negative.   Musculoskeletal: Negative.   Skin: Negative.   Allergic/Immunologic: Negative.   Neurological: Negative.   Hematological: Negative.   Psychiatric/Behavioral: Negative.     Per HPI unless specifically indicated above     Objective:    BP 138/84 (BP Location: Left Arm)   Pulse 60   Ht 5\' 11"  (1.803 m)   Wt 184 lb (83.5 kg)   SpO2 99%   BMI 25.66 kg/m   Wt Readings from Last 3 Encounters:  03/17/17 184 lb (83.5 kg)  03/16/17 184 lb 4.8 oz (83.6 kg)  02/28/16 182 lb 1.6 oz (82.6 kg)    Physical Exam  Constitutional: He is oriented to person, place, and time. He appears well-developed and well-nourished.  HENT:  Head: Normocephalic and atraumatic.  Right Ear: External ear normal.  Left Ear: External ear  normal.  Eyes: Conjunctivae and EOM are normal. Pupils are equal, round, and reactive to light.  Neck: Normal range of motion. Neck supple.  Cardiovascular: Normal rate, regular rhythm, normal heart sounds and intact distal pulses.  Pulmonary/Chest: Effort normal and breath sounds normal.  Abdominal: Soft. Bowel sounds are normal. There is no splenomegaly or hepatomegaly.  Genitourinary: Rectum normal, prostate normal and penis normal.  Musculoskeletal: Normal range of motion.  Neurological: He is alert and oriented to person, place, and time. He has normal reflexes.  Skin: No rash noted. No erythema.  Psychiatric: He has a normal mood and affect. His behavior is normal. Judgment and thought content normal.  EKG with normal sinus rhythm and 1 PVC  Results for orders placed or performed in visit on 02/28/16  Comprehensive metabolic panel  Result Value Ref Range   Glucose 109 (H) 65 - 99 mg/dL   BUN 16 8 - 27 mg/dL   Creatinine, Ser 1.10 0.76 - 1.27 mg/dL   GFR calc non Af Amer 67 >59 mL/min/1.73   GFR calc Af Amer 77 >59 mL/min/1.73   BUN/Creatinine Ratio 15 10 - 24   Sodium 142 134 - 144 mmol/L   Potassium 3.7 3.5 - 5.2 mmol/L   Chloride 102 96 - 106 mmol/L   CO2 24 18 - 29 mmol/L   Calcium 9.3 8.6 - 10.2 mg/dL   Total Protein 6.8 6.0 - 8.5 g/dL  Albumin 4.0 3.5 - 4.8 g/dL   Globulin, Total 2.8 1.5 - 4.5 g/dL   Albumin/Globulin Ratio 1.4 1.2 - 2.2   Bilirubin Total 0.4 0.0 - 1.2 mg/dL   Alkaline Phosphatase 76 39 - 117 IU/L   AST 16 0 - 40 IU/L   ALT 16 0 - 44 IU/L  Lipid panel  Result Value Ref Range   Cholesterol, Total 143 100 - 199 mg/dL   Triglycerides 102 0 - 149 mg/dL   HDL 49 >39 mg/dL   VLDL Cholesterol Cal 20 5 - 40 mg/dL   LDL Calculated 74 0 - 99 mg/dL   Chol/HDL Ratio 2.9 0.0 - 5.0 ratio units  CBC with Differential/Platelet  Result Value Ref Range   WBC 7.5 3.4 - 10.8 x10E3/uL   RBC 5.31 4.14 - 5.80 x10E6/uL   Hemoglobin 15.7 13.0 - 17.7 g/dL    Hematocrit 45.4 37.5 - 51.0 %   MCV 86 79 - 97 fL   MCH 29.6 26.6 - 33.0 pg   MCHC 34.6 31.5 - 35.7 g/dL   RDW 14.4 12.3 - 15.4 %   Platelets 178 150 - 379 x10E3/uL   Neutrophils 63 Not Estab. %   Lymphs 26 Not Estab. %   Monocytes 9 Not Estab. %   Eos 2 Not Estab. %   Basos 0 Not Estab. %   Neutrophils Absolute 4.7 1.4 - 7.0 x10E3/uL   Lymphocytes Absolute 1.9 0.7 - 3.1 x10E3/uL   Monocytes Absolute 0.7 0.1 - 0.9 x10E3/uL   EOS (ABSOLUTE) 0.2 0.0 - 0.4 x10E3/uL   Basophils Absolute 0.0 0.0 - 0.2 x10E3/uL   Immature Granulocytes 0 Not Estab. %   Immature Grans (Abs) 0.0 0.0 - 0.1 x10E3/uL  TSH  Result Value Ref Range   TSH 1.260 0.450 - 4.500 uIU/mL  Urinalysis, Routine w reflex microscopic  Result Value Ref Range   Specific Gravity, UA <1.005 (L) 1.005 - 1.030   pH, UA 6.5 5.0 - 7.5   Color, UA Yellow Yellow   Appearance Ur Clear Clear   Leukocytes, UA Negative Negative   Protein, UA Negative Negative/Trace   Glucose, UA Negative Negative   Ketones, UA Negative Negative   RBC, UA Negative Negative   Bilirubin, UA Negative Negative   Urobilinogen, Ur 0.2 0.2 - 1.0 mg/dL   Nitrite, UA Negative Negative  PSA  Result Value Ref Range   Prostate Specific Ag, Serum 1.4 0.0 - 4.0 ng/mL      Assessment & Plan:   Problem List Items Addressed This Visit      Cardiovascular and Mediastinum   Varicose veins of both lower extremities    Discuss support hose        Digestive   Reflux esophagitis    The current medical regimen is effective;  continue present plan and medications.       Relevant Medications   omeprazole (PRILOSEC) 20 MG capsule   Other Relevant Orders   CBC with Differential/Platelet   Comprehensive metabolic panel   TSH   Urinalysis, Routine w reflex microscopic     Genitourinary   BPH (benign prostatic hyperplasia) - Primary    The current medical regimen is effective;  continue present plan and medications.       Relevant Medications    tamsulosin (FLOMAX) 0.4 MG CAPS capsule   finasteride (PROSCAR) 5 MG tablet   Other Relevant Orders   Comprehensive metabolic panel   Lipid panel   PSA   Urinalysis, Routine  w reflex microscopic     Other   Advanced care planning/counseling discussion    A voluntary discussion about advance care planning including the explanation and discussion of advance directives was extensively discussed  with the patient.  Explanation about the health care proxy and Living will was reviewed and packet with forms with explanation of how to fill them out was given.        Palpitations   Relevant Orders   CBC with Differential/Platelet   Comprehensive metabolic panel   Lipid panel   TSH   EKG 12-Lead (Completed)   Ambulatory referral to Cardiology    Other Visit Diagnoses    Need for hepatitis C screening test       Relevant Orders   Hepatitis C Antibody   PE (physical exam), annual           Follow up plan: Return in about 6 months (around 09/14/2017) for BMP.

## 2017-03-17 NOTE — Assessment & Plan Note (Signed)
A voluntary discussion about advance care planning including the explanation and discussion of advance directives was extensively discussed  with the patient.  Explanation about the health care proxy and Living will was reviewed and packet with forms with explanation of how to fill them out was given.    

## 2017-03-17 NOTE — Telephone Encounter (Signed)
Pt needs rx sent to mail order

## 2017-03-17 NOTE — Telephone Encounter (Signed)
Copied from [place] [id number]. Topic: Quick Communication - Rx Refill/Question >> Mar 17, 2017 11:35 AM Synthia Innocent wrote: Has the patient contacted their pharmacy? Yes.     (Agent: If no, request that the patient contact the pharmacy for the refill.)   Preferred Pharmacy (with phone number or street name): [facility]   Agent: Please be advised that RX refills may take up to 3 business days. We ask that you follow-up with your pharmacy. Needs prescriptions sent to [facility], not [facility]. finasteride (PROSCAR) 5 MG tablet, omeprazole (PRILOSEC) 20 MG capsule and tamsulosin (FLOMAX) 0.4 MG CAPS capsule Patient would call once sent

## 2017-03-17 NOTE — Assessment & Plan Note (Signed)
The current medical regimen is effective;  continue present plan and medications.  

## 2017-03-17 NOTE — Assessment & Plan Note (Signed)
Discuss support hose

## 2017-03-18 ENCOUNTER — Encounter: Payer: Self-pay | Admitting: Family Medicine

## 2017-03-18 LAB — CBC WITH DIFFERENTIAL/PLATELET
Basophils Absolute: 0 10*3/uL (ref 0.0–0.2)
Basos: 0 %
EOS (ABSOLUTE): 0 10*3/uL (ref 0.0–0.4)
EOS: 1 %
HEMATOCRIT: 46.8 % (ref 37.5–51.0)
HEMOGLOBIN: 15.9 g/dL (ref 13.0–17.7)
IMMATURE GRANS (ABS): 0 10*3/uL (ref 0.0–0.1)
IMMATURE GRANULOCYTES: 0 %
LYMPHS ABS: 1.7 10*3/uL (ref 0.7–3.1)
Lymphs: 26 %
MCH: 29.4 pg (ref 26.6–33.0)
MCHC: 34 g/dL (ref 31.5–35.7)
MCV: 87 fL (ref 79–97)
MONOCYTES: 6 %
Monocytes Absolute: 0.4 10*3/uL (ref 0.1–0.9)
Neutrophils Absolute: 4.4 10*3/uL (ref 1.4–7.0)
Neutrophils: 67 %
Platelets: 151 10*3/uL (ref 150–379)
RBC: 5.4 x10E6/uL (ref 4.14–5.80)
RDW: 14.7 % (ref 12.3–15.4)
WBC: 6.5 10*3/uL (ref 3.4–10.8)

## 2017-03-18 LAB — PSA: Prostate Specific Ag, Serum: 1.4 ng/mL (ref 0.0–4.0)

## 2017-03-18 LAB — COMPREHENSIVE METABOLIC PANEL
ALBUMIN: 4.3 g/dL (ref 3.5–4.8)
ALT: 17 IU/L (ref 0–44)
AST: 20 IU/L (ref 0–40)
Albumin/Globulin Ratio: 1.5 (ref 1.2–2.2)
Alkaline Phosphatase: 62 IU/L (ref 39–117)
BUN / CREAT RATIO: 11 (ref 10–24)
BUN: 12 mg/dL (ref 8–27)
Bilirubin Total: 0.5 mg/dL (ref 0.0–1.2)
CALCIUM: 9.3 mg/dL (ref 8.6–10.2)
CO2: 24 mmol/L (ref 20–29)
CREATININE: 1.13 mg/dL (ref 0.76–1.27)
Chloride: 104 mmol/L (ref 96–106)
GFR calc non Af Amer: 64 mL/min/{1.73_m2} (ref >59)
GFR, EST AFRICAN AMERICAN: 74 mL/min/{1.73_m2} (ref >59)
GLOBULIN, TOTAL: 2.8 g/dL (ref 1.5–4.5)
GLUCOSE: 92 mg/dL (ref 65–99)
Potassium: 4.2 mmol/L (ref 3.5–5.2)
SODIUM: 143 mmol/L (ref 134–144)
Total Protein: 7.1 g/dL (ref 6.0–8.5)

## 2017-03-18 LAB — HEPATITIS C ANTIBODY

## 2017-03-18 LAB — LIPID PANEL
Chol/HDL Ratio: 2.7 ratio (ref 0.0–5.0)
Cholesterol, Total: 162 mg/dL (ref 100–199)
HDL: 59 mg/dL (ref >39)
LDL CALC: 90 mg/dL (ref 0–99)
Triglycerides: 64 mg/dL (ref 0–149)
VLDL CHOLESTEROL CAL: 13 mg/dL (ref 5–40)

## 2017-03-18 LAB — TSH: TSH: 1.31 u[IU]/mL (ref 0.450–4.500)

## 2017-04-25 NOTE — Progress Notes (Signed)
Cardiology Office Note  Date:  04/28/2017   ID:  Dominion, Kathan [birth year], MRN [id number]  PCP:  [name], MD   Chief Complaint  Patient presents with  . other     per Justice Rocher for Palpitations. Pt c/o sob at times; denies cp. Pt c/o lightheaded feeling couple times a week. Reviewed meds with pt verbally.    HPI:  [name] is a 72 yo male with PMH Of a "floppy heart"per the patient based off previous stress test though details unclear Referred by Dr. Luvenia Heller for consultation of his previous testing suggesting cardiac hypokinesis, palpitations.  Reports having several months of lightheadedness/near syncope, palpitations, tachycardia. Symptoms have been getting worse over the past several weeks.  Did not know what to make of it.   Notes from care everywhere reviewed, nothing available Reports that previous stress test was done by Dr. Nehemiah Massed ,[place] ,in [place] many years ago   Otherwise has been feeling fine, active at baseline  EKG personally reviewed by myself on todays visit shows long runs of sustained VT, underlying sinus rhythm Clear A-V dissociation  In the office is moderately symptomatic, dizzy when he has episodes He is orthostatic, drop of 50 points in systolic pressure supine to standing 150 down to 536 systolic  Denies ever being told he has underlying coronary disease, no prior smoking history, no diabetes, no alcohol history  Long discussion concerning his family history Reports numerous family members have required pacemakers No sudden death No premature coronary disease   PMH:   has a past medical history of BPH (benign prostatic hyperplasia), Diverticulosis (2017), Dysphagia (12/20/2014), Dysrhythmia, and Varicose veins of both lower extremities (12/20/2014).  PSH:    Past Surgical History:  Procedure Laterality Date  . ESOPHAGOGASTRODUODENOSCOPY (EGD) WITH PROPOFOL N/A 02/19/2015   Procedure: ESOPHAGOGASTRODUODENOSCOPY  (EGD) WITH PROPOFOL and esophageal dilation.;  Surgeon: Lucilla Lame, MD;  Location: [place];  Service: Endoscopy;  Laterality: N/A;  . EYE SURGERY Right    cataract surgery 2017    Current Outpatient Medications  Medication Sig Dispense Refill  . finasteride (PROSCAR) 5 MG tablet Take 1 tablet (5 mg total) by mouth at bedtime. 90 tablet 4  . Multiple Vitamins-Minerals (MULTIVITAMIN ADULT PO) Take 1 tablet by mouth every morning.     [name] omeprazole (PRILOSEC) 20 MG capsule Take 1 capsule (20 mg total) by mouth daily. 90 capsule 4  . tamsulosin (FLOMAX) 0.4 MG CAPS capsule Take 1 capsule (0.4 mg total) by mouth daily. 90 capsule 4   No current facility-administered medications for this visit.      Allergies:   Patient has no known allergies.   Social History:  The patient  reports that  has never smoked. he has never used smokeless tobacco. He reports that he drinks about 0.6 oz of alcohol per week. He reports that he does not use drugs.   Family History:   family history includes Arthritis in his mother; Diabetes in his father; Heart disease in his brother; Hyperlipidemia in his brother; Hypertension in his brother and father; Lung cancer in his daughter.    Review of Systems: Review of Systems  Constitutional: Negative.   Respiratory: Negative.   Cardiovascular: Positive for palpitations.       Tachycardia  Gastrointestinal: Negative.   Musculoskeletal: Negative.   Neurological: Positive for dizziness.  Psychiatric/Behavioral: Negative.   All other systems reviewed and are negative.    PHYSICAL EXAM: VS:  BP (!) 156/76 (  BP Location: Left Arm, Patient Position: Sitting, Cuff Size: Normal)   Pulse (!) 149   Ht 5\' 10"  (1.778 m)   Wt 181 lb 4 oz (82.2 kg)   BMI 26.01 kg/m  , BMI Body mass index is 26.01 kg/m. GEN: Well nourished, well developed, in no acute distress  HEENT: normal  Neck: no JVD, carotid bruits, or masses Cardiac: Tachycardia, no murmurs, rubs, or  gallops,no edema  Respiratory:  clear to auscultation bilaterally, normal work of breathing GI: soft, nontender, nondistended, + BS MS: no deformity or atrophy  Skin: warm and dry, no rash Neuro:  Strength and sensation are intact Psych: euthymic mood, full affect    Recent Labs: 03/17/2017: ALT 17; BUN 12; Creatinine, Ser 1.13; Hemoglobin 15.9; Platelets 151; Potassium 4.2; Sodium 143; TSH 1.310    Lipid Panel Lab Results  Component Value Date   CHOL 162 03/17/2017   HDL 59 03/17/2017   LDLCALC 90 03/17/2017   TRIG 64 03/17/2017      Wt Readings from Last 3 Encounters:  04/28/17 181 lb 4 oz (82.2 kg)  03/17/17 184 lb (83.5 kg)  03/16/17 184 lb 4.8 oz (83.6 kg)       ASSESSMENT AND PLAN:  Palpitations - Plan: EKG 12-Lead As below, secondary to VT  VT (ventricular tachycardia) (HCC) EKG documenting ventricular tachycardia, A-V dissociation He is symptomatic, On EKG more often and VT than he is in sinus rhythm Given he is symptomatic, prolonged runs of VT Recommend transfer to [facility] for VT evaluation Will need ischemic workup, echocardiogram, consider cardiac MRI IV has been placed, he is currently on a monitor awaiting [facility] Will consider giving metoprolol IV push for worsening symptoms or sustained VT Case discussed with Dr. 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 EKG 12-Lead     Signed, Esmond Plants, M.D., Ph.D. 04/28/2017  [place], [place]

## 2017-04-25 NOTE — H&P (View-Only) (Signed)
Cardiology Office Note  Date:  04/28/2017   ID:  Leroy Merritt, Leroy Merritt, Leroy Merritt, MRN [id number]  PCP:  Leroy Maple, MD   Chief Complaint  Patient presents with  . other     per Justice Rocher for Palpitations. Pt c/o sob at times; denies cp. Pt c/o lightheaded feeling couple times a week. Reviewed meds with pt verbally.    HPI:  Leroy Merritt is a 72 yo male with PMH Of a "floppy heart"per the patient based off previous stress test though details unclear Referred by Leroy Merritt for consultation of his previous testing suggesting cardiac hypokinesis, palpitations.  Reports having several months of lightheadedness/near syncope, palpitations, tachycardia. Symptoms have been getting worse over the past several weeks.  Did not know what to make of it.   Notes from care everywhere reviewed, nothing available Reports that previous stress test was done by Leroy Merritt ,in [place] many years ago   Otherwise has been feeling fine, active at baseline  EKG personally reviewed by myself on todays visit shows long runs of sustained VT, underlying sinus rhythm Clear A-V dissociation  In the office is moderately symptomatic, dizzy when he has episodes He is orthostatic, drop of 50 points in systolic pressure supine to standing 150 down to 025 systolic  Denies ever being told he has underlying coronary disease, no prior smoking history, no diabetes, no alcohol history  Long discussion concerning his family history Reports numerous family members have required pacemakers No sudden death No premature coronary disease   PMH:   has a past medical history of BPH (benign prostatic hyperplasia), Diverticulosis (2017), Dysphagia (12/20/2014), Dysrhythmia, and Varicose veins of both lower extremities (12/20/2014).  PSH:    Past Surgical History:  Procedure Laterality Date  . ESOPHAGOGASTRODUODENOSCOPY (EGD) WITH PROPOFOL N/A 02/19/2015   Procedure: ESOPHAGOGASTRODUODENOSCOPY  (EGD) WITH PROPOFOL and esophageal dilation.;  Surgeon: Leroy Lame, MD;  Location: [place];  Service: Endoscopy;  Laterality: N/A;  . EYE SURGERY Right    cataract surgery 2017    Current Outpatient Medications  Medication Sig Dispense Refill  . finasteride (PROSCAR) 5 MG tablet Take 1 tablet (5 mg total) by mouth at bedtime. 90 tablet 4  . Multiple Vitamins-Minerals (MULTIVITAMIN ADULT PO) Take 1 tablet by mouth every morning.     [name] omeprazole (PRILOSEC) 20 MG capsule Take 1 capsule (20 mg total) by mouth daily. 90 capsule 4  . tamsulosin (FLOMAX) 0.4 MG CAPS capsule Take 1 capsule (0.4 mg total) by mouth daily. 90 capsule 4   No current facility-administered medications for this visit.      Allergies:   Patient has no known allergies.   Social History:  The patient  reports that  has never smoked. he has never used smokeless tobacco. He reports that he drinks about 0.6 oz of alcohol per week. He reports that he does not use drugs.   Family History:   family history includes Arthritis in his mother; Diabetes in his father; Heart disease in his brother; Hyperlipidemia in his brother; Hypertension in his brother and father; Lung cancer in his daughter.    Review of Systems: Review of Systems  Constitutional: Negative.   Respiratory: Negative.   Cardiovascular: Positive for palpitations.       Tachycardia  Gastrointestinal: Negative.   Musculoskeletal: Negative.   Neurological: Positive for dizziness.  Psychiatric/Behavioral: Negative.   All other systems reviewed and are negative.    PHYSICAL EXAM: VS:  BP (!) 156/76 (  BP Location: Left Arm, Patient Position: Sitting, Cuff Size: Normal)   Pulse (!) 149   Ht 5\' 10"  (1.778 m)   Wt 181 lb 4 oz (82.2 kg)   BMI 26.Merritt kg/m  , BMI Body mass index is 26.Merritt kg/m. GEN: Well nourished, well developed, in no acute distress  HEENT: normal  Neck: no JVD, carotid bruits, or masses Cardiac: Tachycardia, no murmurs, rubs, or  gallops,no edema  Respiratory:  clear to auscultation bilaterally, normal work of breathing GI: soft, nontender, nondistended, + BS MS: no deformity or atrophy  Skin: warm and dry, no rash Neuro:  Strength and sensation are intact Psych: euthymic mood, full affect    Recent Labs: 03/17/2017: ALT 17; BUN 12; Creatinine, Ser 1.13; Hemoglobin 15.9; Platelets 151; Potassium 4.2; Sodium 143; TSH 1.310    Lipid Panel Lab Results  Component Value Date   CHOL 162 Merritt/10/2017   HDL 59 Merritt/10/2017   LDLCALC 90 Merritt/10/2017   TRIG 64 Merritt/10/2017      Wt Readings from Last 3 Encounters:  04/28/17 181 lb 4 oz (82.2 kg)  Merritt/08/19 184 lb (83.5 kg)  Merritt/07/19 184 lb 4.8 oz (83.6 kg)       ASSESSMENT AND PLAN:  Palpitations - Plan: EKG 12-Lead As below, secondary to VT  VT (ventricular tachycardia) (HCC) EKG documenting ventricular tachycardia, A-V dissociation He is symptomatic, On EKG more often and VT than he is in sinus rhythm Given he is symptomatic, prolonged runs of VT Recommend transfer to [facility] for VT evaluation Will need ischemic workup, echocardiogram, consider cardiac MRI IV has been placed, he is currently on a monitor awaiting [facility] Will consider giving metoprolol IV push for worsening symptoms or sustained VT Case discussed with Leroy Merritt, EP, who has recommended transfer to [facility]  Near syncope Secondary to tachycardia from VT rate going greater than 200 bpm Orthostatic by numbers today  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 EKG 12-Lead     Signed, Leroy Merritt, M.D., Ph.D. 04/28/2017  [place], [place]

## 2017-04-28 ENCOUNTER — Inpatient Hospital Stay (HOSPITAL_COMMUNITY)
Admission: EM | Admit: 2017-04-28 | Discharge: 2017-04-30 | DRG: 225 | Disposition: A | Discharge status: Home / Self Care | Payer: Medicare HMO | Attending: Internal Medicine | Admitting: Internal Medicine

## 2017-04-28 ENCOUNTER — Encounter: Payer: Self-pay | Admitting: Cardiovascular Disease

## 2017-04-28 ENCOUNTER — Inpatient Hospital Stay (HOSPITAL_COMMUNITY)
Admission: EM | Disposition: A | Discharge status: Home / Self Care | Payer: Self-pay | Source: Home / Self Care | Attending: Internal Medicine

## 2017-04-28 ENCOUNTER — Other Ambulatory Visit: Payer: Self-pay

## 2017-04-28 ENCOUNTER — Encounter (HOSPITAL_COMMUNITY): Payer: Self-pay | Admitting: Emergency Medicine

## 2017-04-28 ENCOUNTER — Ambulatory Visit: Payer: Medicare HMO | Admitting: Cardiovascular Disease

## 2017-04-28 ENCOUNTER — Inpatient Hospital Stay: Admission: EM | Admit: 2017-04-28 | Payer: Self-pay | Source: Other Acute Inpatient Hospital | Admitting: Cardiology

## 2017-04-28 VITALS — BP 156/76 | HR 149 | Ht 70.0 in | Wt 181.2 lb

## 2017-04-28 DIAGNOSIS — I4589 Other specified conduction disorders: Secondary | ICD-10-CM | POA: Diagnosis not present

## 2017-04-28 DIAGNOSIS — R55 Syncope and collapse: Secondary | ICD-10-CM

## 2017-04-28 DIAGNOSIS — Z95810 Presence of automatic (implantable) cardiac defibrillator: Secondary | ICD-10-CM

## 2017-04-28 DIAGNOSIS — K219 Gastro-esophageal reflux disease without esophagitis: Secondary | ICD-10-CM | POA: Diagnosis present

## 2017-04-28 DIAGNOSIS — I472 Ventricular tachycardia, unspecified: Secondary | ICD-10-CM

## 2017-04-28 DIAGNOSIS — Z79899 Other long term (current) drug therapy: Secondary | ICD-10-CM

## 2017-04-28 DIAGNOSIS — I8393 Asymptomatic varicose veins of bilateral lower extremities: Secondary | ICD-10-CM | POA: Diagnosis not present

## 2017-04-28 DIAGNOSIS — I420 Dilated cardiomyopathy: Secondary | ICD-10-CM | POA: Diagnosis present

## 2017-04-28 DIAGNOSIS — R000 Tachycardia, unspecified: Secondary | ICD-10-CM | POA: Diagnosis not present

## 2017-04-28 DIAGNOSIS — Z8249 Family history of ischemic heart disease and other diseases of the circulatory system: Secondary | ICD-10-CM | POA: Diagnosis not present

## 2017-04-28 DIAGNOSIS — J9811 Atelectasis: Secondary | ICD-10-CM | POA: Diagnosis not present

## 2017-04-28 DIAGNOSIS — R002 Palpitations: Secondary | ICD-10-CM

## 2017-04-28 DIAGNOSIS — N400 Enlarged prostate without lower urinary tract symptoms: Secondary | ICD-10-CM | POA: Diagnosis not present

## 2017-04-28 HISTORY — DX: Ventricular tachycardia, unspecified: I47.20

## 2017-04-28 HISTORY — DX: Other specified conduction disorders: I45.89

## 2017-04-28 HISTORY — PX: CARDIAC CATHETERIZATION: SHX172

## 2017-04-28 HISTORY — DX: Ventricular tachycardia: I47.2

## 2017-04-28 HISTORY — DX: Gastro-esophageal reflux disease without esophagitis: K21.9

## 2017-04-28 HISTORY — PX: LEFT HEART CATH AND CORONARY ANGIOGRAPHY: CATH118249

## 2017-04-28 LAB — CBC WITH DIFFERENTIAL/PLATELET
BASOS ABS: 0 10*3/uL (ref 0.0–0.1)
BASOS PCT: 0 %
EOS ABS: 0 10*3/uL (ref 0.0–0.7)
EOS PCT: 0 %
HCT: 49.1 % (ref 39.0–52.0)
HEMOGLOBIN: 16.7 g/dL (ref 13.0–17.0)
Lymphocytes Relative: 20 %
Lymphs Abs: 1.8 10*3/uL (ref 0.7–4.0)
MCH: 30.7 pg (ref 26.0–34.0)
MCHC: 34 g/dL (ref 30.0–36.0)
MCV: 90.3 fL (ref 78.0–100.0)
Monocytes Absolute: 0.6 10*3/uL (ref 0.1–1.0)
Monocytes Relative: 7 %
NEUTROS PCT: 73 %
Neutro Abs: 6.4 10*3/uL (ref 1.7–7.7)
PLATELETS: 136 10*3/uL — AB (ref 150–400)
RBC: 5.44 MIL/uL (ref 4.22–5.81)
RDW: 14.7 % (ref 11.5–15.5)
WBC: 8.8 10*3/uL (ref 4.0–10.5)

## 2017-04-28 LAB — COMPREHENSIVE METABOLIC PANEL
ALBUMIN: 4 g/dL (ref 3.5–5.0)
ALK PHOS: 48 U/L (ref 38–126)
ALT: 20 U/L (ref 17–63)
AST: 21 U/L (ref 15–41)
Anion gap: 13 (ref 5–15)
BILIRUBIN TOTAL: 0.9 mg/dL (ref 0.3–1.2)
BUN: 16 mg/dL (ref 6–20)
CALCIUM: 9.8 mg/dL (ref 8.9–10.3)
CO2: 21 mmol/L — ABNORMAL LOW (ref 22–32)
Chloride: 106 mmol/L (ref 101–111)
Creatinine, Ser: 1.39 mg/dL — ABNORMAL HIGH (ref 0.61–1.24)
GFR calc non Af Amer: 49 mL/min — ABNORMAL LOW (ref >60)
GFR, EST AFRICAN AMERICAN: 56 mL/min — AB (ref >60)
Glucose, Bld: 115 mg/dL — ABNORMAL HIGH (ref 65–99)
POTASSIUM: 3.9 mmol/L (ref 3.5–5.1)
SODIUM: 140 mmol/L (ref 135–145)
TOTAL PROTEIN: 7 g/dL (ref 6.5–8.1)

## 2017-04-28 LAB — PROTIME-INR
INR: 1.1
PROTHROMBIN TIME: 14.1 s (ref 11.4–15.2)

## 2017-04-28 LAB — MAGNESIUM: Magnesium: 2.1 mg/dL (ref 1.7–2.4)

## 2017-04-28 LAB — PHOSPHORUS: PHOSPHORUS: 2.6 mg/dL (ref 2.5–4.6)

## 2017-04-28 LAB — I-STAT TROPONIN, ED: TROPONIN I, POC: 0 ng/mL (ref 0.00–0.08)

## 2017-04-28 SURGERY — LEFT HEART CATH AND CORONARY ANGIOGRAPHY
Anesthesia: LOCAL

## 2017-04-28 MED ORDER — HEPARIN SODIUM (PORCINE) 1000 UNIT/ML IJ SOLN
INTRAMUSCULAR | Status: AC
  Filled 2017-04-28: qty 1

## 2017-04-28 MED ORDER — FENTANYL CITRATE (PF) 100 MCG/2ML IJ SOLN
INTRAMUSCULAR | Status: DC | PRN
  Administered 2017-04-28: 25 ug via INTRAVENOUS

## 2017-04-28 MED ORDER — METOPROLOL TARTRATE 5 MG/5ML IV SOLN: 5.0000 mg | INTRAVENOUS | Status: DC | PRN

## 2017-04-28 MED ORDER — IOPAMIDOL (ISOVUE-370) INJECTION 76%
INTRAVENOUS | Status: AC
  Filled 2017-04-28: qty 125

## 2017-04-28 MED ORDER — HEPARIN (PORCINE) IN NACL 2-0.9 UNIT/ML-% IJ SOLN
INTRAMUSCULAR | Status: AC
  Filled 2017-04-28: qty 1000

## 2017-04-28 MED ORDER — VERAPAMIL HCL 2.5 MG/ML IV SOLN
INTRAVENOUS | Status: DC | PRN
  Administered 2017-04-28: 16:00:00 via INTRA_ARTERIAL

## 2017-04-28 MED ORDER — NITROGLYCERIN 0.4 MG SL SUBL: 0.4000 mg | SUBLINGUAL_TABLET | SUBLINGUAL | Status: DC | PRN

## 2017-04-28 MED ORDER — SODIUM CHLORIDE 0.9 % IV SOLN: 250.0000 mL | INTRAVENOUS | Status: DC | PRN

## 2017-04-28 MED ORDER — PANTOPRAZOLE SODIUM 40 MG PO TBEC
40.0000 mg | DELAYED_RELEASE_TABLET | Freq: Every day | ORAL | Status: DC
  Administered 2017-04-28 – 2017-04-29 (×2): 40 mg via ORAL
  Filled 2017-04-28 (×3): qty 1

## 2017-04-28 MED ORDER — IOPAMIDOL (ISOVUE-370) INJECTION 76%
INTRAVENOUS | Status: DC | PRN
Start: 2017-04-28 — End: 2017-04-28
  Administered 2017-04-28: 55 mL via INTRA_ARTERIAL

## 2017-04-28 MED ORDER — LIDOCAINE HCL (PF) 1 % IJ SOLN
INTRAMUSCULAR | Status: AC
  Filled 2017-04-28: qty 30

## 2017-04-28 MED ORDER — MIDAZOLAM HCL 2 MG/2ML IJ SOLN
INTRAMUSCULAR | Status: DC | PRN
  Administered 2017-04-28: 1 mg via INTRAVENOUS

## 2017-04-28 MED ORDER — TAMSULOSIN HCL 0.4 MG PO CAPS
0.4000 mg | ORAL_CAPSULE | Freq: Every day | ORAL | Status: DC
  Administered 2017-04-29: 09:00:00 0.4 mg via ORAL
  Filled 2017-04-28 (×2): qty 1

## 2017-04-28 MED ORDER — SODIUM CHLORIDE 0.9 % WEIGHT BASED INFUSION
1.0000 mL/kg/h | INTRAVENOUS | Status: AC
  Administered 2017-04-28: 1 mL/kg/h via INTRAVENOUS

## 2017-04-28 MED ORDER — HEPARIN (PORCINE) IN NACL 2-0.9 UNIT/ML-% IJ SOLN
INTRAMUSCULAR | Status: AC | PRN
  Administered 2017-04-28: 500 mL via INTRA_ARTERIAL

## 2017-04-28 MED ORDER — FENTANYL CITRATE (PF) 100 MCG/2ML IJ SOLN
INTRAMUSCULAR | Status: AC
Start: 2017-04-28 — End: ?
  Filled 2017-04-28: qty 2

## 2017-04-28 MED ORDER — MIDAZOLAM HCL 2 MG/2ML IJ SOLN
INTRAMUSCULAR | Status: AC
  Filled 2017-04-28: qty 2

## 2017-04-28 MED ORDER — FINASTERIDE 5 MG PO TABS
5.0000 mg | ORAL_TABLET | Freq: Every day | ORAL | Status: DC
  Administered 2017-04-28 – 2017-04-29 (×2): 5 mg via ORAL
  Filled 2017-04-28 (×2): qty 1

## 2017-04-28 MED ORDER — IOPAMIDOL (ISOVUE-370) INJECTION 76%
INTRAVENOUS | Status: AC
  Filled 2017-04-28: qty 50

## 2017-04-28 MED ORDER — VERAPAMIL HCL 2.5 MG/ML IV SOLN
INTRAVENOUS | Status: AC
  Filled 2017-04-28: qty 2

## 2017-04-28 MED ORDER — ACETAMINOPHEN 325 MG PO TABS: 650.0000 mg | ORAL_TABLET | ORAL | Status: DC | PRN

## 2017-04-28 MED ORDER — METOPROLOL TARTRATE 5 MG/5ML IV SOLN
5.0000 mg | Freq: Once | INTRAVENOUS | Status: AC
  Administered 2017-04-28: 5 mg via INTRAVENOUS

## 2017-04-28 MED ORDER — HEPARIN SODIUM (PORCINE) 1000 UNIT/ML IJ SOLN
INTRAMUSCULAR | Status: DC | PRN
  Administered 2017-04-28: 4000 [IU] via INTRAVENOUS

## 2017-04-28 MED ORDER — LIDOCAINE HCL (PF) 1 % IJ SOLN
INTRAMUSCULAR | Status: DC | PRN
  Administered 2017-04-28: 2 mL

## 2017-04-28 MED ORDER — SODIUM CHLORIDE 0.9% FLUSH: 3.0000 mL | INTRAVENOUS | Status: DC | PRN

## 2017-04-28 MED ORDER — SODIUM CHLORIDE 0.9% FLUSH
3.0000 mL | Freq: Two times a day (BID) | INTRAVENOUS | Status: DC
  Administered 2017-04-29 (×3): 3 mL via INTRAVENOUS

## 2017-04-28 MED ORDER — ENOXAPARIN SODIUM 40 MG/0.4ML ~~LOC~~ SOLN: 40.0000 mg | SUBCUTANEOUS | Status: DC

## 2017-04-28 MED ORDER — SOTALOL HCL 120 MG PO TABS
120.0000 mg | ORAL_TABLET | Freq: Two times a day (BID) | ORAL | Status: DC
  Administered 2017-04-28 – 2017-04-29 (×3): 120 mg via ORAL
  Filled 2017-04-28 (×4): qty 1

## 2017-04-28 MED ORDER — METOPROLOL TARTRATE 5 MG/5ML IV SOLN
INTRAVENOUS | Status: AC
  Filled 2017-04-28: qty 5

## 2017-04-28 MED ORDER — ASPIRIN 81 MG PO CHEW
81.0000 mg | CHEWABLE_TABLET | ORAL | Status: AC
  Administered 2017-04-28: 81 mg via ORAL
  Filled 2017-04-28: qty 1

## 2017-04-28 SURGICAL SUPPLY — 11 items
CATH 5FR JL3.5 JR4 ANG PIG MP (CATHETERS) ×2 IMPLANT
DEVICE RAD COMP TR BAND LRG (VASCULAR PRODUCTS) ×2 IMPLANT
ELECT DEFIB PAD ADLT CADENCE (PAD) ×2 IMPLANT
GLIDESHEATH SLEND SS 6F .021 (SHEATH) ×2 IMPLANT
GUIDEWIRE INQWIRE 1.5J.035X260 (WIRE) ×1 IMPLANT
INQWIRE 1.5J .035X260CM (WIRE) ×2
KIT PREMIUM HAND CONTROLLER (KITS) ×2 IMPLANT
KIT SINGLE USE MANIFOLD (KITS) ×2 IMPLANT
PACK CARDIAC CATHETERIZATION (CUSTOM PROCEDURE TRAY) ×2 IMPLANT
TRANSDUCER W/STOPCOCK (MISCELLANEOUS) ×2 IMPLANT
TUBING CIL FLEX 10 FLL-RA (TUBING) ×2 IMPLANT

## 2017-04-28 NOTE — Progress Notes (Signed)
Placed 20 gauge IV to left forearm with blood return and flushed with 0.9% normal saline. Patient with dizziness and continuous EKG set up with crash cart available. [facility] called and pending transport.

## 2017-04-28 NOTE — Interval H&P Note (Signed)
History and Physical Interval Note:  04/28/2017 4:16 PM Cath Lab Visit (complete for each Cath Lab visit)  Clinical Evaluation Leading to the Procedure:   ACS: No.  Non-ACS:    Anginal Classification: CCS II  Anti-ischemic medical therapy: No Therapy  Non-Invasive Test Results: No non-invasive testing performed  Prior CABG: No previous CABG       Leroy Merritt  has presented today for surgery, with the diagnosis of vt  The various methods of treatment have been discussed with the patient and family. After consideration of risks, benefits and other options for treatment, the patient has consented to  Procedure(s): LEFT HEART CATH AND CORONARY ANGIOGRAPHY (N/A) as a surgical intervention .  The patient's history has been reviewed, patient examined, no change in status, stable for surgery.  I have reviewed the patient's chart and labs.  Questions were answered to the patient's satisfaction.     Collier [facility] 04/28/2017 4:16 PM

## 2017-04-28 NOTE — ED Provider Notes (Signed)
Emergency Department Provider Note   I have reviewed the triage vital signs and the nursing notes.   HISTORY  Chief Complaint Tachycardia   HPI Leroy Merritt is a 72 y.o. male with PMH of palpitations, dilated cardiomyopathy, and BPH presents to the ED for evaluation of palpitations and abnormal heart rhythm. The times have been ongoing for the past several weeks.  Symptoms are not significantly worsening today.  He was in his cardiologist office when they performed an EKG and transferred to the emergency department for concern for sustained V. Tach runs.  Patient denies any lightheadedness, chest pain, or other symptoms.  He is having some mild occasional palpitations.  No radiation of symptoms or modifying factors.  No new medications started recently.  Past Medical History:  Diagnosis Date  . BPH (benign prostatic hyperplasia)   . Diverticulosis 2017  . Dysphagia 12/20/2014  . Dysrhythmia    OCCASIONAL SKIP A BEAT/ STRESS TEST OK-DR KOWALSKI  . Varicose veins of both lower extremities 12/20/2014    Patient Active Problem List   Diagnosis Date Noted  . VT (ventricular tachycardia) ([place]) 04/28/2017  . Near syncope 04/28/2017  . Dilated cardiomyopathy ([place]) 04/28/2017  . Advanced care planning/counseling discussion 03/17/2017  . Palpitations 03/17/2017  . Reflux esophagitis   . Stricture and stenosis of esophagus   . BPH (benign prostatic hyperplasia) 12/20/2014  . Varicose veins of both lower extremities 12/20/2014    Past Surgical History:  Procedure Laterality Date  . ESOPHAGOGASTRODUODENOSCOPY (EGD) WITH PROPOFOL N/A 02/19/2015   Procedure: ESOPHAGOGASTRODUODENOSCOPY (EGD) WITH PROPOFOL and esophageal dilation.;  Surgeon: Lucilla Lame, MD;  Location: [place];  Service: Endoscopy;  Laterality: N/A;  . EYE SURGERY Right    cataract surgery 2017    Current Outpatient Rx  . Order #: [id number] Class: Normal  . Order #: [id number] Class: Historical Med  .  Order #: [id number] Class: Normal  . Order #: [id number] Class: Normal    Allergies Patient has no known allergies.  Family History  Problem Relation Age of Onset  . Arthritis Mother   . Diabetes Father   . Hypertension Father   . Heart disease Brother   . Hyperlipidemia Brother   . Hypertension Brother   . Lung cancer Daughter     Social History Social History   Tobacco Use  . Smoking status: Never Smoker  . Smokeless tobacco: Never Used  Substance Use Topics  . Alcohol use: Yes    Alcohol/week: 0.6 oz    Types: 1 Glasses of wine per week    Comment: weekends  . Drug use: No    Review of Systems  Constitutional: No fever/chills Eyes: No visual changes. ENT: No sore throat. Cardiovascular: Denies chest pain. Positive palpitations. Respiratory: Denies shortness of breath. Gastrointestinal: No abdominal pain.  No nausea, no vomiting.  No diarrhea.  No constipation. Genitourinary: Negative for dysuria. Musculoskeletal: Negative for back pain. Skin: Negative for rash. Neurological: Negative for headaches, focal weakness or numbness.  10-point ROS otherwise negative.  ____________________________________________   PHYSICAL EXAM:  VITAL SIGNS: HR: 163 RR: 18 BP: 140/65 SpO2: 100% RA  Constitutional: Alert and oriented. Well appearing and in no acute distress. Eyes: Conjunctivae are normal.  Head: Atraumatic. Nose: No congestion/rhinnorhea. Mouth/Throat: Mucous membranes are moist.  Oropharynx non-erythematous. Neck: No stridor.   Cardiovascular: Regular tachycardia. Good peripheral circulation. Grossly normal heart sounds.   Respiratory: Normal respiratory effort.  No retractions. Lungs CTAB. Gastrointestinal: Soft and nontender. No distention.  Musculoskeletal: No lower extremity  tenderness nor edema. No gross deformities of extremities. Neurologic:  Normal speech and language. No gross focal neurologic deficits are appreciated.  Skin:  Skin is warm, dry  and intact. No rash noted.  ____________________________________________   LABS (all labs ordered are listed, but only abnormal results are displayed)  Labs Reviewed  CBC WITH DIFFERENTIAL/PLATELET - Abnormal; Notable for the following components:      Result Value   Platelets 136 (*)    All other components within normal limits  PROTIME-INR  COMPREHENSIVE METABOLIC PANEL  MAGNESIUM  PHOSPHORUS  I-STAT TROPONIN, ED   ____________________________________________  EKG   EKG Interpretation  Date/Time:  Tuesday April 28 2017 15:01:20 EST Ventricular Rate:  173 PR Interval:    QRS Duration: 98 QT Interval:  333 QTC Calculation: 428 R Axis:   -73 Text Interpretation:  Supraventricular tachycardia Ventricular tachycardia, unsustained Left anterior fascicular block Consider right ventricular hypertrophy Baseline wander in lead(s) V3 No STEMI.  Confirmed by Nanda Quinton [phone]) on 04/28/2017 3:06:47 PM      ____________________________________________   PROCEDURES  Procedure(s) performed:   Procedures  None ____________________________________________   INITIAL IMPRESSION / ASSESSMENT AND PLAN / ED COURSE  Pertinent labs & imaging results that were available during my care of the patient were reviewed by me and considered in my medical decision making (see chart for details).  Patient presents to the emergency department with palpitations.  He is currently in rate of 170s with intermittent sinus rhythm but prolonged runs tachycardia. Dr. Caryl Comes spoke with outside cardiologist. Damaris Schooner with Cardiology immediately after my evaluation and they are sending Dr. Lovena Le down to evaluate the patient. Zoll in place. Ordering basic labs and will reassess.   Cardiology has placed orders and plan for admit.   Discussed patient's case with Cardiology to request admission. Patient and family (if present) updated with plan. Care transferred to Cardiology service.  I reviewed all  nursing notes, vitals, pertinent old records, EKGs, labs, imaging (as available).  ____________________________________________  FINAL CLINICAL IMPRESSION(S) / ED DIAGNOSES  Final diagnoses:  Ventricular tachycardia ([place])     MEDICATIONS GIVEN DURING THIS VISIT:  Medications  metoprolol tartrate (LOPRESSOR) 5 MG/5ML injection (not administered)  nitroGLYCERIN (NITROSTAT) SL tablet 0.4 mg (not administered)  acetaminophen (TYLENOL) tablet 650 mg (not administered)  enoxaparin (LOVENOX) injection 40 mg (not administered)  metoprolol tartrate (LOPRESSOR) injection 5 mg (5 mg Intravenous Given 04/28/17 1540)    Note:  This document was prepared using Dragon voice recognition software and may include unintentional dictation errors.  Nanda Quinton, MD Emergency Medicine    Long, [facility], MD 04/28/17 1600

## 2017-04-28 NOTE — ED Triage Notes (Signed)
Pt here from Cardiology office with c/o svt . Pt has no c/o chest pain or sob just a slight flutter in his chest

## 2017-04-28 NOTE — H&P (Signed)
Cardiology Admission History and Physical:   Patient ID: Leroy Merritt; MRN: [id number]; DOB: March 02, 1946   Admission date: 04/28/2017  Primary Care Provider: [name], MD Primary Cardiologist: new to Dr. Rockey Situ today Primary Electrophysiologist: New to Dr. Lovena Le today  Chief Complaint:  Palpitations, lightheadedness  Patient Profile:   Leroy Merritt is a 72 y.o. male with a history of GERD, esophageal stricture dilated, BPH  History of Present Illness:   Leroy Merritt has years worth hx of palpitations mostly a skipped/extra beat type feeling, sometimes a fluttering, about 5 years ago he had what sounds like an echo done that he reports he was told was a "flabby" heart, not worrisome or unusual for his age.  This report not available.  In the last year or several months perhaps more fluttering, though no associated symptoms.  He mentioned this to his PMD last month at his annual visit and was referred to cardiology.  He also mentions episodes of lightheadedness, sometimes needs to sit until it passes, though he does not connect this symptom to the palpitations.  He saw Dr. Rockey Situ today was found to have recurrent episodes of VT and sent to [facility] via carellink for admission, work up with EP support/services.  He was found there as well to have + orthostatic BP change of 39mmHg.  The patient denies any kind of CP, heaviness or pressure, no exertional intolerances, gets a little winded with stairs, unchanged for years.  No syncope.  No family hx of sudden death.  LABS BMET is pending WBC 8.8 H/H 16/49 Plts 136 poc Trop 0.00  The patient feels well currently, feels occasional brief fluttering, no CP, no SOB, here, has not had any dizziness, in the office he mentioned some dizziness.  Past Medical History:  Diagnosis Date  . BPH (benign prostatic hyperplasia)   . Diverticulosis 2017  . Dysphagia 12/20/2014  . Dysrhythmia    OCCASIONAL SKIP A BEAT/ STRESS TEST OK-DR KOWALSKI  .  Varicose veins of both lower extremities 12/20/2014    Past Surgical History:  Procedure Laterality Date  . ESOPHAGOGASTRODUODENOSCOPY (EGD) WITH PROPOFOL N/A 02/19/2015   Procedure: ESOPHAGOGASTRODUODENOSCOPY (EGD) WITH PROPOFOL and esophageal dilation.;  Surgeon: Lucilla Lame, MD;  Location: [place];  Service: Endoscopy;  Laterality: N/A;  . EYE SURGERY Right    cataract surgery 2017     Medications Prior to Admission: Prior to Admission medications   Medication Sig Start Date End Date Taking? Authorizing Provider  finasteride (PROSCAR) 5 MG tablet Take 1 tablet (5 mg total) by mouth at bedtime. 03/17/17   [name], MD  Multiple Vitamins-Minerals (MULTIVITAMIN ADULT PO) Take 1 tablet by mouth every morning.  01/11/15   [provider]  omeprazole (PRILOSEC) 20 MG capsule Take 1 capsule (20 mg total) by mouth daily. 03/17/17   [name], MD  tamsulosin (FLOMAX) 0.4 MG CAPS capsule Take 1 capsule (0.4 mg total) by mouth daily. 03/17/17   [name], MD     Allergies:   No Known Allergies  Social History:   Social History   Socioeconomic History  . Marital status: Married    Spouse name: Not on file  . Number of children: Not on file  . Years of education: college  . Highest education level: Not on file  Social Needs  . Financial resource strain: Not hard at all  . Food insecurity - worry: Never true  . Food insecurity - inability: Never true  .  Transportation needs - medical: No  . Transportation needs - non-medical: No  Occupational History  . Not on file  Tobacco Use  . Smoking status: Never Smoker  . Smokeless tobacco: Never Used  Substance and Sexual Activity  . Alcohol use: Yes    Alcohol/week: 0.6 oz    Types: 1 Glasses of wine per week    Comment: weekends  . Drug use: No  . Sexual activity: Not on file  Other Topics Concern  . Not on file  Social History Narrative  . Not on file    Family History:   The patient's family  history includes Arthritis in his mother; Diabetes in his father; Heart disease in his brother; Hyperlipidemia in his brother; Hypertension in his brother and father; Lung cancer in his daughter.    ROS:  Please see the history of present illness.  All other ROS reviewed and negative.     Physical Exam/Data:   BP - 140/72, P- 140 and ireg, R - 18 General:  Well nourished, well developed, in no acute distress HEENT: normal Lymph: no adenopathy Neck: no JVD Endocrine:  No thryomegaly Vascular: No carotid bruits Cardiac:  IReg tachy; no murmur s, gallops or rubs Lungs:  CTA b/l, no wheezing, rhonchi or rales  Abd: soft, nontender  Ext: no edema Musculoskeletal:  No deformities Skin: warm and dry  Neuro:  No  Gross focal abnormalities noted Psych:  Normal affect    EKG:  The ECG that was done   was personally reviewed with Dr. Lovena Le and demonstrates  VT, 208bpm 03/17/17 is SR 68bpm, LAD  Telemetry is SR, frequent self limiting episodes of VT, PVC/couplets of different morphology  Relevant CV Studies:  Echo ordered  Laboratory Data:  Chemistry Recent Labs  Lab 04/28/17 1514  NA 140  K 3.9  CL 106  CO2 21*  GLUCOSE 115*  BUN 16  CREATININE 1.39*  CALCIUM 9.8  GFRNONAA 49*  GFRAA 56*  ANIONGAP 13    Recent Labs  Lab 04/28/17 1514  PROT 7.0  ALBUMIN 4.0  AST 21  ALT 20  ALKPHOS 48  BILITOT 0.9   Hematology Recent Labs  Lab 04/28/17 1514  WBC 8.8  RBC 5.44  HGB 16.7  HCT 49.1  MCV 90.3  MCH 30.7  MCHC 34.0  RDW 14.7  PLT 136*   Cardiac EnzymesNo results for input(s): TROPONINI in the last 168 hours.  Recent Labs  Lab 04/28/17 1519  TROPIPOC 0.00    BNPNo results for input(s): BNP, PROBNP in the last 168 hours.  DDimer No results for input(s): DDIMER in the last 168 hours.  Radiology/Studies:  No results found.  Assessment and Plan:   1. VT -  The etiology of his VT is unclear. I have recommended he undergo left heart cath though I do  not suspect coronary ischemia. If no ischemia, plan Cardiac tomorrow. Will use IV lopressor initially. 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 His VT was associate with hemodynamic instability. Frequency has slowed s/p IV lopressor.  Dr. Lovena Le has seen and examined the patient, discussed VT, plans for cardiac cath, and further investigation into his VT with the patient's wife at bedside.  Cardiac cath today Echo Cardiac MRI  Final EP recommendations pending findings above. I   Severity of Illness: The appropriate patient status for this patient is INPATIENT. Inpatient status is judged to be reasonable and necessary in order to provide  the required intensity of service to ensure the patient's safety. The patient's presenting symptoms, physical exam findings, and initial radiographic and laboratory data in the context of their chronic comorbidities is felt to place them at high risk for further clinical deterioration. Furthermore, it is not anticipated that the patient will be medically stable for discharge from the hospital within 2 midnights of admission. The following factors support the patient status of inpatient.   he patient's presenting symptoms include VT.  The worrisome physical exam findings include near syncope/dizziness * I certify that at the point of admission it is my clinical judgment that the patient will require inpatient hospital care spanning beyond 2 midnights from the point of admission due to high intensity of service, high risk for further deterioration and high frequency of surveillance required.*    For questions or updates, please contact [place] Please consult [email or web address] for contact info under Cardiology/STEMI.    Signed, Cristopher Peru, MD  04/28/2017 4:27 PM

## 2017-04-28 NOTE — Patient Instructions (Signed)
You are having sustained VT This likely explains your orthostasis, near syncope, palpitations, general malaise  We have discussed the case with Dr. Caryl Comes, EP Recommendation is for transfer to [facility] hospital in [place]  Medication Instructions:   No medication changes made  Labwork:  No new labs needed  Testing/Procedures:  No further testing at this time   Follow-Up: It was a pleasure seeing you in the office today. Please call us if you have new issues that need to be addressed before your next appt.  [phone]  Your physician wants you to follow-up in:  After discharge from [facility]   If you need a refill on your cardiac medications before your next appointment, please call your pharmacy.

## 2017-04-28 NOTE — Progress Notes (Signed)
TR BAND REMOVAL  LOCATION:    right radial  DEFLATED PER PROTOCOL:    Yes.    TIME BAND OFF / DRESSING APPLIED:    1830   SITE UPON ARRIVAL:    Level 0  SITE AFTER BAND REMOVAL:    Level 0  CIRCULATION SENSATION AND MOVEMENT:    Within Normal Limits   Yes.    COMMENTS:    

## 2017-04-29 ENCOUNTER — Inpatient Hospital Stay (HOSPITAL_COMMUNITY): Payer: Medicare HMO

## 2017-04-29 ENCOUNTER — Encounter (HOSPITAL_COMMUNITY): Payer: Self-pay | Admitting: Cardiology

## 2017-04-29 ENCOUNTER — Inpatient Hospital Stay (HOSPITAL_COMMUNITY)
Admission: EM | Disposition: A | Discharge status: Home / Self Care | Payer: Self-pay | Source: Home / Self Care | Attending: Internal Medicine

## 2017-04-29 DIAGNOSIS — I472 Ventricular tachycardia: Secondary | ICD-10-CM

## 2017-04-29 DIAGNOSIS — I5020 Unspecified systolic (congestive) heart failure: Secondary | ICD-10-CM

## 2017-04-29 HISTORY — DX: Unspecified systolic (congestive) heart failure: I50.20

## 2017-04-29 HISTORY — PX: ICD IMPLANT: EP1208

## 2017-04-29 LAB — ECHOCARDIOGRAM COMPLETE
AOASC: 34 cm
CHL CUP MV DEC (S): 292
CHL CUP REG VEL DIAS: 110 cm/s
E/e' ratio: 9.95
EWDT: 292 ms
FS: 16 % — AB (ref 28–44)
Height: 70 in
IVS/LV PW RATIO, ED: 1.29
LADIAMINDEX: 1.98 cm/m2
LASIZE: 40 mm
LAVOL: 80.5 mL
LAVOLA4C: 57.7 mL
LAVOLIN: 39.8 mL/m2
LEFT ATRIUM END SYS DIAM: 40 mm
LV E/e' medial: 9.95
LV E/e'average: 9.95
LV PW d: 7 mm — AB (ref 0.6–1.1)
LV TDI E'MEDIAL: 4.7
LV e' LATERAL: 5.51 cm/s
LVOT area: 3.8 cm2
LVOTD: 22 mm
Lateral S' vel: 8.27 cm/s
MV pk A vel: 58.2 m/s
MV pk E vel: 54.8 m/s
MVAP: 2.56 cm2
P 1/2 time: 86 ms
TAPSE: 21.9 mm
TDI e' lateral: 5.51
Weight: 2892.44 oz

## 2017-04-29 LAB — BASIC METABOLIC PANEL
Anion gap: 10 (ref 5–15)
BUN: 13 mg/dL (ref 6–20)
CALCIUM: 8.6 mg/dL — AB (ref 8.9–10.3)
CO2: 22 mmol/L (ref 22–32)
CREATININE: 1.19 mg/dL (ref 0.61–1.24)
Chloride: 110 mmol/L (ref 101–111)
GFR calc non Af Amer: 59 mL/min — ABNORMAL LOW (ref >60)
Glucose, Bld: 93 mg/dL (ref 65–99)
Potassium: 3.9 mmol/L (ref 3.5–5.1)
SODIUM: 142 mmol/L (ref 135–145)

## 2017-04-29 LAB — MAGNESIUM: MAGNESIUM: 2 mg/dL (ref 1.7–2.4)

## 2017-04-29 LAB — SURGICAL PCR SCREEN
MRSA, PCR: NEGATIVE
Staphylococcus aureus: NEGATIVE

## 2017-04-29 SURGERY — ICD IMPLANT

## 2017-04-29 MED ORDER — SODIUM CHLORIDE 0.9 % IR SOLN
Status: AC
  Filled 2017-04-29: qty 2

## 2017-04-29 MED ORDER — LIVING BETTER WITH HEART FAILURE BOOK
Freq: Once | Status: AC
  Administered 2017-04-29: 09:00:00

## 2017-04-29 MED ORDER — CEFAZOLIN SODIUM-DEXTROSE 2-4 GM/100ML-% IV SOLN
INTRAVENOUS | Status: AC
Start: 2017-04-29 — End: ?
  Filled 2017-04-29: qty 100

## 2017-04-29 MED ORDER — OFF THE BEAT BOOK
Freq: Once | Status: AC
  Administered 2017-04-29: 09:00:00
  Filled 2017-04-29: qty 1

## 2017-04-29 MED ORDER — MUPIROCIN 2 % EX OINT
TOPICAL_OINTMENT | Freq: Two times a day (BID) | CUTANEOUS | Status: DC
  Administered 2017-04-29: 15:00:00 via NASAL
  Filled 2017-04-29: qty 22

## 2017-04-29 MED ORDER — MIDAZOLAM HCL 5 MG/5ML IJ SOLN
INTRAMUSCULAR | Status: AC
  Filled 2017-04-29: qty 5

## 2017-04-29 MED ORDER — SODIUM CHLORIDE 0.9% FLUSH: 3.0000 mL | Freq: Two times a day (BID) | INTRAVENOUS | Status: DC

## 2017-04-29 MED ORDER — ONDANSETRON HCL 4 MG/2ML IJ SOLN: 4.0000 mg | Freq: Four times a day (QID) | INTRAMUSCULAR | Status: DC | PRN

## 2017-04-29 MED ORDER — ACTIVE PARTNERSHIP FOR HEALTH OF YOUR HEART BOOK
Freq: Once | Status: AC
  Administered 2017-04-29: 15:00:00
  Filled 2017-04-29: qty 1

## 2017-04-29 MED ORDER — HEPARIN (PORCINE) IN NACL 2-0.9 UNIT/ML-% IJ SOLN
INTRAMUSCULAR | Status: AC
  Filled 2017-04-29: qty 500

## 2017-04-29 MED ORDER — GADOBENATE DIMEGLUMINE 529 MG/ML IV SOLN
40.0000 mL | Freq: Once | INTRAVENOUS | Status: AC
  Administered 2017-04-29: 12:00:00 38 mL via INTRAVENOUS

## 2017-04-29 MED ORDER — ACETAMINOPHEN 325 MG PO TABS
325.0000 mg | ORAL_TABLET | ORAL | Status: DC | PRN
  Administered 2017-04-29 (×2): 650 mg via ORAL
  Filled 2017-04-29 (×2): qty 2

## 2017-04-29 MED ORDER — LIDOCAINE HCL (PF) 1 % IJ SOLN
INTRAMUSCULAR | Status: DC | PRN
  Administered 2017-04-29: 45 mL

## 2017-04-29 MED ORDER — SODIUM CHLORIDE 0.9 % IV SOLN
INTRAVENOUS | Status: DC
  Administered 2017-04-29: 13:00:00 via INTRAVENOUS

## 2017-04-29 MED ORDER — SODIUM CHLORIDE 0.9% FLUSH
3.0000 mL | INTRAVENOUS | Status: DC | PRN
Start: 2017-04-29 — End: 2017-04-29

## 2017-04-29 MED ORDER — SODIUM CHLORIDE 0.9 % IR SOLN
80.0000 mg | Status: AC
  Administered 2017-04-29: 80 mg

## 2017-04-29 MED ORDER — CEFAZOLIN SODIUM-DEXTROSE 2-4 GM/100ML-% IV SOLN
2.0000 g | INTRAVENOUS | Status: AC
  Administered 2017-04-29: 2 g via INTRAVENOUS

## 2017-04-29 MED ORDER — MIDAZOLAM HCL 5 MG/5ML IJ SOLN
INTRAMUSCULAR | Status: DC | PRN
  Administered 2017-04-29 (×4): 1 mg via INTRAVENOUS
  Administered 2017-04-29: 2 mg via INTRAVENOUS
  Administered 2017-04-29: 1 mg via INTRAVENOUS

## 2017-04-29 MED ORDER — FENTANYL CITRATE (PF) 100 MCG/2ML IJ SOLN
INTRAMUSCULAR | Status: DC | PRN
  Administered 2017-04-29 (×2): 12.5 ug via INTRAVENOUS
  Administered 2017-04-29: 25 ug via INTRAVENOUS
  Administered 2017-04-29 (×2): 12.5 ug via INTRAVENOUS

## 2017-04-29 MED ORDER — HEPARIN (PORCINE) IN NACL 2-0.9 UNIT/ML-% IJ SOLN
INTRAMUSCULAR | Status: AC | PRN
  Administered 2017-04-29: 500 mL

## 2017-04-29 MED ORDER — FENTANYL CITRATE (PF) 100 MCG/2ML IJ SOLN
INTRAMUSCULAR | Status: AC
  Filled 2017-04-29: qty 2

## 2017-04-29 MED ORDER — CHLORHEXIDINE GLUCONATE 4 % EX LIQD: 60.0000 mL | Freq: Once | CUTANEOUS | Status: AC

## 2017-04-29 MED ORDER — SODIUM CHLORIDE 0.9 % IV SOLN: 250.0000 mL | INTRAVENOUS | Status: DC

## 2017-04-29 MED ORDER — CHLORHEXIDINE GLUCONATE 4 % EX LIQD
60.0000 mL | Freq: Once | CUTANEOUS | Status: AC
  Administered 2017-04-29: 4 via TOPICAL
  Filled 2017-04-29: qty 60

## 2017-04-29 MED ORDER — YOU HAVE A PACEMAKER BOOK
Freq: Once | Status: AC
  Administered 2017-04-29: 09:00:00
  Filled 2017-04-29: qty 1

## 2017-04-29 MED ORDER — CEFAZOLIN SODIUM-DEXTROSE 1-4 GM/50ML-% IV SOLN
1.0000 g | Freq: Four times a day (QID) | INTRAVENOUS | Status: AC
  Administered 2017-04-29 – 2017-04-30 (×3): 1 g via INTRAVENOUS
  Filled 2017-04-29 (×3): qty 50

## 2017-04-29 MED ORDER — LIDOCAINE HCL 1 % IJ SOLN
INTRAMUSCULAR | Status: AC
  Filled 2017-04-29: qty 60

## 2017-04-29 MED FILL — Heparin Sodium (Porcine) 2 Unit/ML in Sodium Chloride 0.9%: INTRAMUSCULAR | Qty: 500 | Status: AC

## 2017-04-29 SURGICAL SUPPLY — 8 items
CABLE SURGICAL S-101-97-12 (CABLE) ×3 IMPLANT
ICD ELLIPSE DR CD2411-36C (ICD Generator) ×3 IMPLANT
LEAD DURATA 7122-65CM (Lead) ×3 IMPLANT
LEAD TENDRIL MRI 52CM LPA1200M (Lead) ×3 IMPLANT
PAD DEFIB LIFELINK (PAD) ×3 IMPLANT
SHEATH CLASSIC 7F (SHEATH) ×3 IMPLANT
SHEATH CLASSIC 8F (SHEATH) ×3 IMPLANT
TRAY PACEMAKER INSERTION (PACKS) ×3 IMPLANT

## 2017-04-29 NOTE — Progress Notes (Signed)
  Echocardiogram 2D Echocardiogram has been performed.  Alton Bouknight G Ximena Todaro 04/29/2017, 1:35 PM

## 2017-04-29 NOTE — Interval H&P Note (Signed)
History and Physical Interval Note:  04/29/2017 3:00 PM  Leroy Merritt  has presented today for surgery, with the diagnosis of vt  The various methods of treatment have been discussed with the patient and family. After consideration of risks, benefits and other options for treatment, the patient has consented to  Procedure(s): ICD IMPLANT (N/A) as a surgical intervention .  The patient's history has been reviewed, patient examined, no change in status, stable for surgery.  I have reviewed the patient's chart and labs.  Questions were answered to the patient's satisfaction.     Cristopher Peru

## 2017-04-29 NOTE — Progress Notes (Addendum)
Progress Note  Patient Name: Leroy Merritt Date of Encounter: 04/29/2017  Primary Cardiologist: No primary care provider on file.   Subjective   Feels well, no CP, palpitations or SOB, no procedure site pain  Inpatient Medications    Scheduled Meds: . active partnership for health of your heart book   Does not apply Once  . enoxaparin (LOVENOX) injection  40 mg Subcutaneous Q24H  . finasteride  5 mg Oral QHS  . pantoprazole  40 mg Oral Daily  . sodium chloride flush  3 mL Intravenous Q12H  . sotalol  120 mg Oral Q12H  . tamsulosin  0.4 mg Oral Daily   Continuous Infusions: . sodium chloride     PRN Meds: sodium chloride, acetaminophen, metoprolol tartrate, nitroGLYCERIN, sodium chloride flush   Vital Signs    Vitals:   04/29/17 0000 04/29/17 0200 04/29/17 0400 04/29/17 0700  BP: 99/74 111/80 122/84 125/83  Pulse:   64 63  Resp: (!) 21 13 13 15   Temp:   97.6 F (36.4 C) 97.8 F (36.6 C)  TempSrc:   Oral Oral  SpO2: 96% 96% 97% 96%  Weight:   180 lb 12.4 oz (82 kg)     Intake/Output Summary (Last 24 hours) at 04/29/2017 0942 Last data filed at 04/29/2017 0428 Gross per 24 hour  Intake 942.81 ml  Output 1750 ml  Net -807.19 ml   Filed Weights   04/29/17 0400  Weight: 180 lb 12.4 oz (82 kg)    Telemetry    SR, infrequent PVCs now, only a couple brief NSVT last PM - Personally Reviewed  ECG    SR, 1st degree AVblock, QTc is OK - Personally Reviewed  Physical Exam   GEN: No acute distress.   Neck: No JVD Cardiac: RRR, no murmurs, rubs, or gallops.  Respiratory: CTA b/l. GI: Soft, nontender, non-distended  MS: No edema; No deformity.  R radial site is stable, no bleeding, no hematoma, ecchymosis, 3+ radial pulse Neuro:  Nonfocal  Psych: Normal affect   Labs    Chemistry Recent Labs  Lab 04/28/17 1514 04/29/17 0410  NA 140 142  K 3.9 3.9  CL 106 110  CO2 21* 22  GLUCOSE 115* 93  BUN 16 13  CREATININE 1.39* 1.19  CALCIUM 9.8 8.6*  PROT  7.0  --   ALBUMIN 4.0  --   AST 21  --   ALT 20  --   ALKPHOS 48  --   BILITOT 0.9  --   GFRNONAA 49* 59*  GFRAA 56* >60  ANIONGAP 13 10     Hematology Recent Labs  Lab 04/28/17 1514  WBC 8.8  RBC 5.44  HGB 16.7  HCT 49.1  MCV 90.3  MCH 30.7  MCHC 34.0  RDW 14.7  PLT 136*    Cardiac EnzymesNo results for input(s): TROPONINI in the last 168 hours.  Recent Labs  Lab 04/28/17 1519  TROPIPOC 0.00     BNPNo results for input(s): BNP, PROBNP in the last 168 hours.   DDimer No results for input(s): DDIMER in the last 168 hours.   Radiology    No results found.  Cardiac Studies   04/28/17: LHC  There is severe left ventricular systolic dysfunction.  LV end diastolic pressure is normal.  The left ventricular ejection fraction is 25-35% by visual estimate. 1. Nonobstructive CAD 2. Severe LV dysfunction. Global hypokinesis with EF 30%. 3. Normal LVEDP   Patient Profile     71  y.o. male with a history of GERD, esophageal stricture dilated, BPH, admitted with palpitations/intermittent dizziness, noted in office to have recurrent VT, slef limiting though persistently on/off  Assessment & Plan    1. VT     PRN lopressor, sotalol added     Rhythm has settled significantly  No CAD by cath, LV gram w/EF 30% Echo is pending Cardiac MRI is pending Likely for ICD implant later this afternoon pending MRI completion  QTc is OK K+ 3.9 Mag yesterday 2.1 Creat 1.19 (Calc CrCl is 64), continue BID dosing Daily EKG's/labs   For questions or updates, please contact [facility] Please consult [email or web address] for contact info under Cardiology/STEMI.      Signed, Baldwin Jamaica, PA-C  04/29/2017, 9:42 AM    EP Attending  Patient seen and examined. Agree with above. The patient is doing well today. MRI scan of the heart demonstrated severe LV dysfunction, mild RV dysfunction but no fatty infiltration or evidence of ARVC. We will proceed with ICD insertion.  Additional treatments will depend on how much more VT he has. He will remain on sotalol.  Mikle Bosworth.D.

## 2017-04-29 NOTE — Discharge Summary (Addendum)
ELECTROPHYSIOLOGY PROCEDURE DISCHARGE SUMMARY    Patient ID: Leroy Merritt,  MRN: [id number], DOB/AGE: 09-Dec-1945 72 y.o.  Admit date: 04/28/2017 Discharge date: 04/30/17  Primary Care Physician: [name], MD  Primary Cardiologist: Dr. Rockey Situ Electrophysiologist: new this admission, Dr. Lovena Le  Primary Discharge Diagnosis:  1. VT 2. NICM   No Known Allergies   Procedures This Admission:  1.  Implantation of a SJM dual chamber ICD on 04/29/17 by Dr Lovena Le.  The patient received a St. Jude (serial # [id number] ) right atrial lead and a St. Jude (serial number [id number]) right ventricular defibrillator lead, St. Jude (serial Number [id number]) ICD DFT's were successful at 20 J.  There were no immediate post procedure complications. 2.  CXR on 04/30/17 demonstrated no pneumothorax status post device implantation.   Brief HPI: [name] is a 72 y.o. male with a history of GERD, esophageal stricture dilated, BPH was referred to Dr. Rockey Situ out patient for palpitations, at his appointment noted to have recurrent and frequent runs of VT.  He was transported to [facility] via [place] for further evaluation and management.   Hospital Course:  The patient reported years worth hx of palpitations mostly a skipped/extra beat type feeling, sometimes a fluttering, about 5 years ago he had what sounds like an echo done that he reported he was told was a "flabby" heart, not worrisome or unusual for his age.  This report not available.  In the last year or several months perhaps more fluttering, though no associated symptoms.  He mentioned this to his PMD last month at his annual visit and was referred to cardiology.  He reported episodes of lightheadedness, sometimes needs to sit until it passes, though he did not connect this symptom to the palpitations.  While at Dr. Donivan Scull office he had dizziness/associated with the runs of VT.  In the ER supine, he had minimal symptoms.  He was admitted and  started given IV lopressor that diminished the frequency of his VT.  He underwent LHC noted no obstructive CAD, LVEF was markedly reduced, started on sotalol.  He had cardiac MRI without infiltrative disease, again markedly reduced LVEF of 27%.  Echo noted LVEF 30-35%.  Subsequently underwent ICD implantation, details as outlined above. He was monitored on telemetry throughout his stay SR, intermittently has periods of recurrent NSVT.  Electrolytes and daily EKGs were monitored. EKG the day of discharge is reviewed by Dr. Lovena Le, given paced QRS/ICD in place, QTc is OK . Electrolytes, remained stable, will follow labs out patient, NPO most of yesterday, expect renal function to resume to baseline.  Will add ACE to his regime for his CM tx.     Left chest was without hematoma or ecchymosis.  The device was interrogated and found to be functioning normally.  CXR was obtained and demonstrated no pneumothorax status post device implantation.  Post cath and ICD wound care, arm mobility, and restrictions were reviewed with the patient.  The patient feels well, denies any CP, palpitations or SOB, no radial site pain, numbness, minimal discomfort at ICD site.  He was examined by Dr. Lovena Le and considered stable for discharge to home.  The patient was made aware of [place] law, no driving for 6 months   Physical Exam: Vitals:   04/29/17 2000 04/30/17 0252 04/30/17 0545 04/30/17 0814  BP:  (!) 94/59 (!) 94/59 130/74  Pulse:  60 60 78  Resp: (!) 23 14 14 13   Temp:  [name])  97.5 F (36.4 C) (!) 97.5 F (36.4 C) 97.9 F (36.6 C)  TempSrc:  Oral Oral Oral  SpO2: 93% 99% 99% 99%  Weight:  183 lb 6.8 oz (83.2 kg) 183 lb 6.8 oz (83.2 kg)   Height:   5\' 10"  (1.778 m)     GEN- The patient is well appearing, alert and oriented x 3 today.   HEENT: normocephalic, atraumatic; sclera clear, conjunctiva pink; hearing intact; oropharynx clear Lungs- CTA b/l, normal work of breathing.  No wheezes, rales, rhonchi Heart- RRR,  no murmurs, rubs or gallops, PMI not laterally displaced GI- soft, non-tender, non-distended Extremities- no clubbing, cyanosis, or edema, R radial site radial pulses 2+ bilaterally MS- no significant deformity or atrophy Skin- warm and dry, no rash or lesion, left chest without hematoma/ecchymosis Psych- euthymic mood, full affect Neuro- no gross defecits  Labs:   Lab Results  Component Value Date   WBC 8.8 04/28/2017   HGB 16.7 04/28/2017   HCT 49.1 04/28/2017   MCV 90.3 04/28/2017   PLT 136 (L) 04/28/2017    Recent Labs  Lab 04/28/17 1514  04/30/17 0240  NA 140   < > 140  K 3.9   < > 4.1  CL 106   < > 108  CO2 21*   < > 22  BUN 16   < > 20  CREATININE 1.39*   < > 1.45*  CALCIUM 9.8   < > 8.4*  PROT 7.0  --   --   BILITOT 0.9  --   --   ALKPHOS 48  --   --   ALT 20  --   --   AST 21  --   --   GLUCOSE 115*   < > 102*   < > = values in this interval not displayed.    Discharge Medications:  Allergies as of 04/30/2017   No Known Allergies     Medication List    TAKE these medications   finasteride 5 MG tablet Commonly known as:  PROSCAR Take 1 tablet (5 mg total) by mouth at bedtime.   lisinopril 5 MG tablet Commonly known as:  PRINIVIL,ZESTRIL Take 1 tablet (5 mg total) by mouth daily.   MULTIVITAMIN ADULT PO Take 1 tablet by mouth every morning.   omeprazole 20 MG capsule Commonly known as:  PRILOSEC Take 1 capsule (20 mg total) by mouth daily. What changed:  when to take this   sotalol 160 MG tablet Commonly known as:  BETAPACE Take 1 tablet (160 mg total) by mouth every 12 (twelve) hours.   tamsulosin 0.4 MG Caps capsule Commonly known as:  FLOMAX Take 1 capsule (0.4 mg total) by mouth daily. What changed:  when to take this       Disposition: Home Discharge Instructions    Diet - low sodium heart healthy   Complete by:  As directed    Increase activity slowly   Complete by:  As directed      Follow-up Information    [facility] Follow up on 05/13/2017.   Specialty:  Cardiology Why:  11:00AM, wound check visit Contact information: [address], [place]       [place] Follow up on 05/07/2017.   Specialty:  Cardiology Why:  9:00, EKG and lab draw Contact information: [address] [place] [employer],  Champ Mungo, MD Follow up on 07/27/2017.   Specialty:  Cardiology Why:  9:15AM Contact information: [address] [address] [phone]           Duration of Discharge Encounter: Greater than 30 minutes including physician time.  Venetia Night, PA-C 04/30/2017 9:37 AM  EP Attending  Patient seen and examined. Agree with above. He is stable for DC home on current meds including sotalol. Usual followup.  Mikle Bosworth.D.

## 2017-04-30 ENCOUNTER — Other Ambulatory Visit: Payer: Self-pay | Admitting: Physician Assistant

## 2017-04-30 ENCOUNTER — Telehealth: Payer: Self-pay | Admitting: Cardiovascular Disease

## 2017-04-30 ENCOUNTER — Inpatient Hospital Stay (HOSPITAL_COMMUNITY): Payer: Medicare HMO

## 2017-04-30 ENCOUNTER — Encounter (HOSPITAL_COMMUNITY): Payer: Self-pay | Admitting: Internal Medicine

## 2017-04-30 DIAGNOSIS — I472 Ventricular tachycardia, unspecified: Secondary | ICD-10-CM

## 2017-04-30 DIAGNOSIS — Z79899 Other long term (current) drug therapy: Secondary | ICD-10-CM

## 2017-04-30 LAB — BASIC METABOLIC PANEL
ANION GAP: 10 (ref 5–15)
BUN: 20 mg/dL (ref 6–20)
CHLORIDE: 108 mmol/L (ref 101–111)
CO2: 22 mmol/L (ref 22–32)
Calcium: 8.4 mg/dL — ABNORMAL LOW (ref 8.9–10.3)
Creatinine, Ser: 1.45 mg/dL — ABNORMAL HIGH (ref 0.61–1.24)
GFR calc Af Amer: 54 mL/min — ABNORMAL LOW (ref >60)
GFR calc non Af Amer: 46 mL/min — ABNORMAL LOW (ref >60)
GLUCOSE: 102 mg/dL — AB (ref 65–99)
POTASSIUM: 4.1 mmol/L (ref 3.5–5.1)
Sodium: 140 mmol/L (ref 135–145)

## 2017-04-30 LAB — MAGNESIUM: Magnesium: 2.1 mg/dL (ref 1.7–2.4)

## 2017-04-30 MED ORDER — OFF THE BEAT BOOK
Freq: Once | Status: DC
  Filled 2017-04-30: qty 1

## 2017-04-30 MED ORDER — SOTALOL HCL 80 MG PO TABS
160.0000 mg | ORAL_TABLET | Freq: Two times a day (BID) | ORAL | Status: DC
  Administered 2017-04-30: 11:00:00 160 mg via ORAL
  Filled 2017-04-30: qty 2

## 2017-04-30 MED ORDER — LISINOPRIL 5 MG PO TABS: 5.0000 mg | ORAL_TABLET | Freq: Every day | ORAL | 3 refills | Status: DC

## 2017-04-30 MED ORDER — LISINOPRIL 5 MG PO TABS: 5.0000 mg | ORAL_TABLET | Freq: Every day | ORAL | 11 refills | Status: DC

## 2017-04-30 MED ORDER — SOTALOL HCL 160 MG PO TABS: 160.0000 mg | ORAL_TABLET | Freq: Two times a day (BID) | ORAL | 6 refills | Status: DC

## 2017-04-30 MED ORDER — YOU HAVE A PACEMAKER BOOK
Freq: Once | Status: DC
  Filled 2017-04-30: qty 1

## 2017-04-30 MED ORDER — SOTALOL HCL 160 MG PO TABS: 160.0000 mg | ORAL_TABLET | Freq: Two times a day (BID) | ORAL | 3 refills | Status: DC

## 2017-04-30 MED FILL — Lidocaine HCl Local Inj 1%: INTRAMUSCULAR | Qty: 20 | Status: AC

## 2017-04-30 NOTE — Telephone Encounter (Signed)
LMOM. Gave call back # to [facility].

## 2017-04-30 NOTE — Telephone Encounter (Signed)
Patient calling to get help hooking up home remote device box please call to assist with connection

## 2017-04-30 NOTE — Discharge Instructions (Signed)
Supplemental Discharge Instructions for  Pacemaker/Defibrillator Patients  Activity No heavy lifting or vigorous activity with your left/right arm for 6 to 8 weeks.  Do not raise your left/right arm above your head for one week.  Gradually raise your affected arm as drawn below.             05/03/17                     05/04/17                    05/05/17                   05/06/17 __  NO DRIVING for  6 months .  WOUND CARE - Keep the wound area clean and dry.  Do not get this area wet for one week. No showers for one week; you may shower on 05/06/17  . - The tape/steri-strips on your wound will fall off; do not pull them off.  No bandage is needed on the site.  DO  NOT apply any creams, oils, or ointments to the wound area. - If you notice any drainage or discharge from the wound, any swelling or bruising at the site, or you develop a fever > 101? F after you are discharged home, call the office at once.  Special Instructions - You are still able to use cellular telephones; use the ear opposite the side where you have your pacemaker/defibrillator.  Avoid carrying your cellular phone near your device. - When traveling through airports, show security personnel your identification card to avoid being screened in the metal detectors.  Ask the security personnel to use the hand wand. - Avoid arc welding equipment, MRI testing (magnetic resonance imaging), TENS units (transcutaneous nerve stimulators).  Call the office for questions about other devices. - Avoid electrical appliances that are in poor condition or are not properly grounded. - Microwave ovens are safe to be near or to operate.  Additional information for defibrillator patients should your device go off: - If your device goes off ONCE and you feel fine afterward, notify the device clinic nurses. - If your device goes off ONCE and you do not feel well afterward, call 911. - If your device goes off TWICE, call 911. - If your device  goes off THREE times in one day, call 911.  DO NOT DRIVE YOURSELF OR A FAMILY MEMBER WITH A DEFIBRILLATOR TO THE HOSPITAL--CALL 911.    Radial Site Care (right wrist care) Refer to this sheet in the next few weeks. These instructions provide you with information on caring for yourself after your procedure. Your caregiver may also give you more specific instructions. Your treatment has been planned according to current medical practices, but problems sometimes occur. Call your caregiver if you have any problems or questions after your procedure. HOME CARE INSTRUCTIONS  You may shower the day after the procedure.Remove the bandage (dressing) and gently wash the site with plain soap and water.Gently pat the site dry.   Do not apply powder or lotion to the site.   Do not submerge the affected site in water for 3 to 5 days.   Inspect the site at least twice daily.   Do not flex or bend the affected arm for 24 hours.   No lifting over 5 pounds (2.3 kg) for 5 days after your procedure.   Do not drive home if you are discharged the same day  of the procedure. Have someone else drive you.   You may drive 24 hours after the procedure unless otherwise instructed by your caregiver.  What to expect:  Any bruising will usually fade within 1 to 2 weeks.   Blood that collects in the tissue (hematoma) may be painful to the touch. It should usually decrease in size and tenderness within 1 to 2 weeks.  SEEK IMMEDIATE MEDICAL CARE IF:  You have unusual pain at the radial site.   You have redness, warmth, swelling, or pain at the radial site.   You have drainage (other than a small amount of blood on the dressing).   You have chills.   You have a fever or persistent symptoms for more than 72 hours.   You have a fever and your symptoms suddenly get worse.   Your arm becomes pale, cool, tingly, or numb.   You have heavy bleeding from the site. Hold pressure on the site.

## 2017-04-30 NOTE — Consult Note (Signed)
            [facility] Primary Care Navigator  04/30/2017  Leroy Merritt 04-21-1945 [id number]   Seen patientand wife (Leroy Merritt)at the bedsideto identify possible discharge needs. Patientreports he had "heart racing at 200 beats/ min on his recent visit with cardiologist and having palpitations and light-headedness"that had led to this admission.  PatientendorsesDr. Golden Pop [facility].   Patientshared Psychologist, sport and exercise pharmacy in [place] to obtainmedications without any problem.   Patientstatesmanaginghisownmedications at [employer] out of the conatiners.  Patient reports that he has been driving prior to admission but wife will beprovidingtransportation to hisdoctors'appointments after discharge.  Patient and wife were informed of [employer] transportation benefits as well and both were grateful of the information.   Patientlives withwife at home and she will serve ashisprimary caregiverafter discharge.   Anticipated discharge plan ishomeperpatient.  Patientand wifevoiced understanding to call primary care provider's office whenhereturns home, for a post discharge follow-up appointment within1-2 weeksor sooner if needed.Patient letter (with PCP's contact number) was provided astheirreminder.  Explained topatient and wiferegardingTHN CM services available for health managementat homebut both denied of any current needs or concerns as thistime.  Patient and wifevoiced understandingto seek referral from primary care provider to [facility] care management ifnecessaryand deemed appropriate for anyservices in the future.  [facility] care management information provided for future needs thathemay have.  Patienthadverbally agreedand optedforEMMIcalls tofollowup his recoveryat home.  Referral made for [facility] calls after  discharge.   For additional questions please contact:  Edwena Felty A. Iasia Forcier, BSN, RN-BC [facility] PRIMARY CARE Navigator Cell: [phone]

## 2017-05-07 ENCOUNTER — Ambulatory Visit (INDEPENDENT_AMBULATORY_CARE_PROVIDER_SITE_OTHER): Payer: Medicare HMO | Admitting: *Deleted

## 2017-05-07 DIAGNOSIS — I472 Ventricular tachycardia, unspecified: Secondary | ICD-10-CM

## 2017-05-07 DIAGNOSIS — Z79899 Other long term (current) drug therapy: Secondary | ICD-10-CM

## 2017-05-07 NOTE — Progress Notes (Signed)
1.) Reason for visit: EKG  2.) Name of MD requesting visit: Dr. Hettie Holstein, PA  3.) H&P: Patient recently admitted for ventricular tachycardia. ICD was placed on 04/29/17 by Dr. Lovena Le and he was started on sotalol 160 mg BID. He comes in today for EKG follow up as well as a BMP/ Magnesium level.   4.) ROS related to problem: Patient currently without complaints today. He is aware of no driving x 6 months.   5.) Assessment and plan per MD: EKG reviewed by Dr. Caryl Comes and within normal limits He is sinus rhythm with a heart rate of 60 bpm. The patient is instructed to keep his upcoming wound check appointment on 05/13/17 with the [facility] & his follow up with Dr. Lovena Le on 07/27/17 in [place]- he will let us know if he would like to change that to see Dr. Caryl Comes here in the [place] office.

## 2017-05-07 NOTE — Patient Instructions (Signed)
Medication Instructions: - Your physician recommends that you continue on your current medications as directed. Please refer to the Current Medication list given to you today.  Labwork: - done today  Procedures/Testing: - none ordered  Follow-Up: - as scheduled.   Any Additional Special Instructions Will Be Listed Below (If Applicable).     If you need a refill on your cardiac medications before your next appointment, please call your pharmacy.

## 2017-05-08 LAB — BASIC METABOLIC PANEL
BUN/Creatinine Ratio: 13 (ref 10–24)
BUN: 14 mg/dL (ref 8–27)
CALCIUM: 9.1 mg/dL (ref 8.6–10.2)
CO2: 21 mmol/L (ref 20–29)
CREATININE: 1.11 mg/dL (ref 0.76–1.27)
Chloride: 106 mmol/L (ref 96–106)
GFR calc non Af Amer: 66 mL/min/{1.73_m2} (ref >59)
GFR, EST AFRICAN AMERICAN: 76 mL/min/{1.73_m2} (ref >59)
Glucose: 84 mg/dL (ref 65–99)
Potassium: 4.2 mmol/L (ref 3.5–5.2)
Sodium: 143 mmol/L (ref 134–144)

## 2017-05-08 LAB — MAGNESIUM: Magnesium: 2.4 mg/dL — ABNORMAL HIGH (ref 1.6–2.3)

## 2017-05-13 ENCOUNTER — Ambulatory Visit (INDEPENDENT_AMBULATORY_CARE_PROVIDER_SITE_OTHER): Payer: Medicare HMO | Admitting: *Deleted

## 2017-05-13 DIAGNOSIS — I472 Ventricular tachycardia, unspecified: Secondary | ICD-10-CM

## 2017-05-13 DIAGNOSIS — I420 Dilated cardiomyopathy: Secondary | ICD-10-CM | POA: Diagnosis not present

## 2017-05-13 LAB — CUP PACEART INCLINIC DEVICE CHECK
Brady Statistic RA Percent Paced: 0.07 %
Brady Statistic RV Percent Paced: 0.41 %
HIGH POWER IMPEDANCE MEASURED VALUE: 61.875
Implantable Lead Implant Date: 20190220
Implantable Lead Model: 7122
Lead Channel Impedance Value: 512.5 Ohm
Lead Channel Impedance Value: 550 Ohm
Lead Channel Pacing Threshold Amplitude: 0.5 V
Lead Channel Pacing Threshold Amplitude: 0.5 V
Lead Channel Pacing Threshold Amplitude: 0.75 V
Lead Channel Pacing Threshold Pulse Width: 0.5 ms
Lead Channel Sensing Intrinsic Amplitude: 11.8 mV
Lead Channel Sensing Intrinsic Amplitude: 4.2 mV
Lead Channel Setting Pacing Amplitude: 3.5 V
Lead Channel Setting Pacing Pulse Width: 0.5 ms
Lead Channel Setting Sensing Sensitivity: 0.5 mV
MDC IDC LEAD IMPLANT DT: 20190220
MDC IDC LEAD LOCATION: 753859
MDC IDC LEAD LOCATION: 753860
MDC IDC MSMT BATTERY REMAINING LONGEVITY: 102 mo
MDC IDC MSMT LEADCHNL RA PACING THRESHOLD AMPLITUDE: 0.75 V
MDC IDC MSMT LEADCHNL RA PACING THRESHOLD PULSEWIDTH: 0.5 ms
MDC IDC MSMT LEADCHNL RV PACING THRESHOLD PULSEWIDTH: 0.5 ms
MDC IDC MSMT LEADCHNL RV PACING THRESHOLD PULSEWIDTH: 0.5 ms
MDC IDC PG IMPLANT DT: 20190220
MDC IDC PG SERIAL: 9787528
MDC IDC SESS DTM: 20190306120009
MDC IDC SET LEADCHNL RV PACING AMPLITUDE: 3.5 V

## 2017-05-13 NOTE — Progress Notes (Signed)
Wound check appointment. Steri-strips removed. Wound without redness or edema. Incision edges approximated, wound well healed. Normal device function. Thresholds, sensing, and impedances consistent with implant measurements. Device programmed at 3.5V for extra safety margin until 3 month visit. Histogram distribution appropriate for patient and level of activity. No mode switches or ventricular arrhythmias noted. Patient educated about wound care, arm mobility, lifting restrictions, shock plan and Merlin monitoring. ROV with GT 07/27/17. After 3 month follow-up, patient wishes to follow up with Dr. Caryl Comes in [place].

## 2017-07-07 ENCOUNTER — Encounter: Payer: Self-pay | Admitting: Internal Medicine

## 2017-07-27 ENCOUNTER — Encounter: Payer: Self-pay | Admitting: Internal Medicine

## 2017-07-27 ENCOUNTER — Ambulatory Visit: Payer: Medicare HMO | Admitting: Internal Medicine

## 2017-07-27 VITALS — BP 130/84 | HR 57 | Ht 70.0 in | Wt 176.0 lb

## 2017-07-27 DIAGNOSIS — Z95810 Presence of automatic (implantable) cardiac defibrillator: Secondary | ICD-10-CM

## 2017-07-27 DIAGNOSIS — I472 Ventricular tachycardia, unspecified: Secondary | ICD-10-CM

## 2017-07-27 DIAGNOSIS — I420 Dilated cardiomyopathy: Secondary | ICD-10-CM

## 2017-07-27 LAB — CUP PACEART INCLINIC DEVICE CHECK
HighPow Impedance: 75.375
Implantable Lead Implant Date: 20190220
Implantable Lead Model: 7122
Implantable Pulse Generator Implant Date: 20190220
Lead Channel Impedance Value: 450 Ohm
Lead Channel Pacing Threshold Amplitude: 0.75 V
Lead Channel Pacing Threshold Amplitude: 0.75 V
Lead Channel Pacing Threshold Amplitude: 1 V
Lead Channel Pacing Threshold Pulse Width: 0.5 ms
Lead Channel Pacing Threshold Pulse Width: 0.5 ms
Lead Channel Sensing Intrinsic Amplitude: 2.5 mV
Lead Channel Setting Pacing Amplitude: 2.5 V
Lead Channel Setting Pacing Pulse Width: 0.5 ms
MDC IDC LEAD IMPLANT DT: 20190220
MDC IDC LEAD LOCATION: 753859
MDC IDC LEAD LOCATION: 753860
MDC IDC MSMT BATTERY REMAINING LONGEVITY: 99 mo
MDC IDC MSMT LEADCHNL RA PACING THRESHOLD AMPLITUDE: 1 V
MDC IDC MSMT LEADCHNL RA PACING THRESHOLD PULSEWIDTH: 0.5 ms
MDC IDC MSMT LEADCHNL RA PACING THRESHOLD PULSEWIDTH: 0.5 ms
MDC IDC MSMT LEADCHNL RV IMPEDANCE VALUE: 600 Ohm
MDC IDC MSMT LEADCHNL RV SENSING INTR AMPL: 11.8 mV
MDC IDC SESS DTM: 20190520111429
MDC IDC SET LEADCHNL RA PACING AMPLITUDE: 2 V
MDC IDC SET LEADCHNL RV SENSING SENSITIVITY: 0.5 mV
MDC IDC STAT BRADY RA PERCENT PACED: 0.05 %
MDC IDC STAT BRADY RV PERCENT PACED: 0.08 %
Pulse Gen Serial Number: 9787528

## 2017-07-27 NOTE — Patient Instructions (Addendum)
Medication Instructions:  Your physician recommends that you continue on your current medications as directed. Please refer to the Current Medication list given to you today.  Labwork: None ordered.  Testing/Procedures: None ordered.  Follow-Up: Your physician wants you to follow-up in: 1 year with Dr. Caryl Comes in [place].   You will receive a reminder letter in the mail two months in advance. If you don't receive a letter, please call our office to schedule the follow-up appointment.  Remote monitoring is used to monitor your ICD from home. This monitoring reduces the number of office visits required to check your device to one time per year. It allows [place] to keep an eye on the functioning of your device to ensure it is working properly. You are scheduled for a device check from home on 08/13/2017. You may send your transmission at any time that day. If you have a wireless device, the transmission will be sent automatically. After your physician reviews your transmission, you will receive a postcard with your next transmission date.  Any Other Special Instructions Will Be Listed Below (If Applicable).  If you need a refill on your cardiac medications before your next appointment, please call your pharmacy.

## 2017-07-27 NOTE — Progress Notes (Signed)
HPI Leroy Merritt returns today for followup of his ICD. He had presented about 3 months ago with symptomatic VT and severe LV dysfunction, found to have no obstructive CAD, and no evidence of infiltrative disease on cardiac MR. The patient has done well. 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 He denies any ICD shocks and has had no syncope. He has class 2 CHF at most. No Known Allergies   Current Outpatient Medications  Medication Sig Dispense Refill  . finasteride (PROSCAR) 5 MG tablet Take 1 tablet (5 mg total) by mouth at bedtime. 90 tablet 4  . lisinopril (PRINIVIL,ZESTRIL) 5 MG tablet Take 1 tablet (5 mg total) by mouth daily. 30 tablet 3  . Multiple Vitamins-Minerals (MULTIVITAMIN ADULT PO) Take 1 tablet by mouth every morning.     [name] omeprazole (PRILOSEC) 20 MG capsule Take 1 capsule (20 mg total) by mouth daily. (Patient taking differently: Take 20 mg by mouth every other day. ) 90 capsule 4  . sotalol (BETAPACE) 160 MG tablet Take 1 tablet (160 mg total) by mouth every 12 (twelve) hours. 60 tablet 3  . tamsulosin (FLOMAX) 0.4 MG CAPS capsule Take 1 capsule (0.4 mg total) by mouth daily. (Patient taking differently: Take 0.4 mg by mouth daily after supper. ) 90 capsule 4   No current facility-administered medications for this visit.      Past Medical History:  Diagnosis Date  . 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 BPH (benign prostatic hyperplasia)   . Diverticulosis 2017  . Dysphagia 12/20/2014  . Dysrhythmia    OCCASIONAL SKIP A BEAT/ STRESS TEST OK-DR KOWALSKI  . GERD (gastroesophageal reflux disease)   . 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 Ventricular tachycardia ([place]) 04/28/2017   "in dr's office"    ROS:   All systems reviewed and negative except as noted in the HPI.   Past Surgical History:  Procedure Laterality Date  . CARDIAC CATHETERIZATION  04/28/2017  . CATARACT EXTRACTION W/ INTRAOCULAR LENS  IMPLANT Right 2017  . ESOPHAGOGASTRODUODENOSCOPY (EGD) WITH PROPOFOL N/A 02/19/2015   Procedure: ESOPHAGOGASTRODUODENOSCOPY (EGD) WITH PROPOFOL and esophageal dilation.;  Surgeon: Lucilla Lame, MD;  Location: [facility];  Service: Endoscopy;  Laterality: N/A;  . EYE SURGERY    . ICD IMPLANT N/A 04/29/2017   Procedure: ICD IMPLANT;  Surgeon: Evans Lance, MD;  Location: [facility];  Service: Cardiovascular;  Laterality: N/A;  . LEFT HEART CATH AND CORONARY ANGIOGRAPHY N/A 04/28/2017   Procedure: LEFT HEART CATH AND CORONARY ANGIOGRAPHY;  Surgeon: Martinique, Peter M, MD;  Location: [facility];  Service: Cardiovascular;  Laterality: N/A;  . PROSTATE BIOPSY  ~ 2012  . RETINAL DETACHMENT SURGERY Right 1990s   "torn retina"     Family History  Problem Relation Age of Onset  . Arthritis Mother   . Diabetes Father   . Hypertension Father   . Heart disease Brother   . Hyperlipidemia Brother   . Hypertension Brother   . Lung cancer Daughter      Social History   Socioeconomic History  . Marital status: Married    Spouse name: Not on file  . Number of children: Not on file  . Years of education: college  . Highest education level: Not on file  Occupational History  . Not on file  Social Needs  . Financial resource strain: Not hard at all  .  Food insecurity:    Worry: Never true    Inability: Never true  . Transportation needs:    Medical: No    Non-medical: No  Tobacco Use  . Smoking status: Never Smoker  . Smokeless tobacco: Never Used  Substance and Sexual Activity  . Alcohol use: Yes    Alcohol/week: 0.6 oz    Types: 1 Glasses of wine per week  . Drug use: No  . Sexual activity: Not Currently  Lifestyle  . Physical activity:    Days per week: 1 day    Minutes per session: 30 min  . Stress: Not at all  Relationships  . Social connections:    Talks on phone: More than three times a week    Gets together: More than three times a week    Attends  religious service: More than 4 times per year    Active member of club or organization: No    Attends meetings of clubs or organizations: Never    Relationship status: Married  . Intimate partner violence:    Fear of current or ex partner: No    Emotionally abused: No    Physically abused: No    Forced sexual activity: No  Other Topics Concern  . Not on file  Social History Narrative  . Not on file     BP 130/84   Pulse (!) 57   Ht 5\' 10"  (1.778 m)   Wt 176 lb (79.8 kg)   SpO2 97%   BMI 25.25 kg/m   Physical Exam:  Well appearing 72 yo man, NAD HEENT: Unremarkable Neck:  6 cmJVD, no thyromegally Lymphatics:  No adenopathy Back:  No CVA tenderness Lungs:  Clear with no wheezes CV: regular rate rhythm, no murmurs, no rubs, no clicks Abd:  soft, positive bowel sounds, no organomegally, no rebound, no guarding Ext:  2 plus pulses, no edema, no cyanosis, no clubbing Skin:  No rashes no nodules Neuro:  CN II through XII intact, motor grossly intact  EKG - NSR  DEVICE  Normal device function.  See PaceArt for details.   Assess/Plan: 1. VT - he has not had any additional VT. He will undergo watchful waiting. 2. ICD - his St. Jude device is interogated and is working normally.  3. Chronic systolic heart failure - his symptoms are really class 2A. He will continue his current meds. I dont think he would benefit from entresto.  Mikle Bosworth.D.

## 2017-07-28 ENCOUNTER — Other Ambulatory Visit: Payer: Self-pay | Admitting: Physician Assistant

## 2017-08-13 ENCOUNTER — Ambulatory Visit (INDEPENDENT_AMBULATORY_CARE_PROVIDER_SITE_OTHER): Payer: Medicare HMO | Admitting: *Deleted

## 2017-08-13 DIAGNOSIS — I472 Ventricular tachycardia, unspecified: Secondary | ICD-10-CM

## 2017-08-13 DIAGNOSIS — R002 Palpitations: Secondary | ICD-10-CM

## 2017-08-13 NOTE — Progress Notes (Signed)
Remote ICD transmission.   

## 2017-08-27 ENCOUNTER — Telehealth: Payer: Self-pay | Admitting: Internal Medicine

## 2017-08-27 NOTE — Telephone Encounter (Signed)
°*  STAT* If patient is at the pharmacy, call can be transferred to refill team.   1. Which medications need to be refilled? (please list name of each medication and dose if known)      Sotalol 160 mg po q 12 hrs 2. Which pharmacy/location (including street and city if local pharmacy) is medication to be sent to?     [facility] [address] [place]  3. Do they need a 30 day or 90 day supply? 1 week waiting on mail order

## 2017-08-27 NOTE — Telephone Encounter (Signed)
Please review for refill, Thanks !  

## 2017-08-27 NOTE — Telephone Encounter (Signed)
Please advise if Dr. Rockey Situ would like to refill. Patient was prescribed this in the hospital Patient does not have a follow up appointment with him.

## 2017-08-27 NOTE — Telephone Encounter (Signed)
This is Dr. Donivan Scull pt. This medication was prescribed to the pt in the hospital. The pt also sees Dr. Caryl Comes in [place]. Please address

## 2017-08-28 MED ORDER — SOTALOL HCL 160 MG PO TABS: 160.0000 mg | ORAL_TABLET | Freq: Two times a day (BID) | ORAL | 0 refills | Status: DC

## 2017-08-28 NOTE — Telephone Encounter (Signed)
Patient started on sotalol as an inpatient by Dr. Lovena Le.  He did follow up with Dr. Lovena Le 07/27/17 and was advised to follow up with Dr. Caryl Comes in [place]. Recall put in for Dr. Caryl Comes as there was none entered.  Will refill sotalol 160  Mg BID as requested to local pharmacy for a 1 week supply.  Order placed under Dr. Lovena Le until the patient is seen by Dr. Caryl Comes.   I attempted to call the patient to let him know this was sent in, but his voice mail was unidentified and I did not leave a detailed message- no DPR, I just asked that he call the office back.

## 2017-09-12 ENCOUNTER — Other Ambulatory Visit: Payer: Self-pay | Admitting: Physician Assistant

## 2017-10-05 LAB — CUP PACEART REMOTE DEVICE CHECK
Battery Voltage: 3.19 V
Brady Statistic AP VP Percent: 1 %
Brady Statistic AS VP Percent: 1 %
Brady Statistic RA Percent Paced: 1 %
HighPow Impedance: 75 Ohm
HighPow Impedance: 75 Ohm
Implantable Lead Implant Date: 20190220
Implantable Lead Location: 753859
Implantable Lead Model: 7122
Lead Channel Impedance Value: 460 Ohm
Lead Channel Pacing Threshold Amplitude: 0.75 V
Lead Channel Sensing Intrinsic Amplitude: 11.8 mV
Lead Channel Setting Pacing Amplitude: 2 V
Lead Channel Setting Pacing Amplitude: 2.5 V
Lead Channel Setting Pacing Pulse Width: 0.5 ms
MDC IDC LEAD IMPLANT DT: 20190220
MDC IDC LEAD LOCATION: 753860
MDC IDC MSMT BATTERY REMAINING LONGEVITY: 94 mo
MDC IDC MSMT BATTERY REMAINING PERCENTAGE: 93 %
MDC IDC MSMT LEADCHNL RA PACING THRESHOLD AMPLITUDE: 1 V
MDC IDC MSMT LEADCHNL RA PACING THRESHOLD PULSEWIDTH: 0.5 ms
MDC IDC MSMT LEADCHNL RA SENSING INTR AMPL: 3.3 mV
MDC IDC MSMT LEADCHNL RV IMPEDANCE VALUE: 530 Ohm
MDC IDC MSMT LEADCHNL RV PACING THRESHOLD PULSEWIDTH: 0.5 ms
MDC IDC PG IMPLANT DT: 20190220
MDC IDC PG SERIAL: 9787528
MDC IDC SESS DTM: 20190606060030
MDC IDC SET LEADCHNL RV SENSING SENSITIVITY: 0.5 mV
MDC IDC STAT BRADY AP VS PERCENT: 1 %
MDC IDC STAT BRADY AS VS PERCENT: 99 %
MDC IDC STAT BRADY RV PERCENT PACED: 1 %

## 2017-10-30 DIAGNOSIS — H2512 Age-related nuclear cataract, left eye: Secondary | ICD-10-CM | POA: Diagnosis not present

## 2017-11-12 ENCOUNTER — Ambulatory Visit (INDEPENDENT_AMBULATORY_CARE_PROVIDER_SITE_OTHER): Payer: Medicare HMO | Admitting: *Deleted

## 2017-11-12 DIAGNOSIS — I420 Dilated cardiomyopathy: Secondary | ICD-10-CM

## 2017-11-12 NOTE — Progress Notes (Signed)
Remote ICD transmission.   

## 2017-12-08 LAB — CUP PACEART REMOTE DEVICE CHECK
Battery Remaining Percentage: 90 %
Battery Voltage: 3.16 V
Brady Statistic AP VP Percent: 1 %
Brady Statistic RA Percent Paced: 1 %
Brady Statistic RV Percent Paced: 1 %
HighPow Impedance: 80 Ohm
HighPow Impedance: 80 Ohm
Implantable Lead Implant Date: 20190220
Implantable Lead Location: 753859
Implantable Lead Model: 7122
Implantable Pulse Generator Implant Date: 20190220
Lead Channel Impedance Value: 460 Ohm
Lead Channel Impedance Value: 540 Ohm
Lead Channel Pacing Threshold Amplitude: 1 V
Lead Channel Pacing Threshold Pulse Width: 0.5 ms
Lead Channel Sensing Intrinsic Amplitude: 11.8 mV
Lead Channel Setting Pacing Amplitude: 2.5 V
MDC IDC LEAD IMPLANT DT: 20190220
MDC IDC LEAD LOCATION: 753860
MDC IDC MSMT BATTERY REMAINING LONGEVITY: 92 mo
MDC IDC MSMT LEADCHNL RA PACING THRESHOLD PULSEWIDTH: 0.5 ms
MDC IDC MSMT LEADCHNL RA SENSING INTR AMPL: 2.6 mV
MDC IDC MSMT LEADCHNL RV PACING THRESHOLD AMPLITUDE: 0.75 V
MDC IDC PG SERIAL: 9787528
MDC IDC SESS DTM: 20190905060018
MDC IDC SET LEADCHNL RA PACING AMPLITUDE: 2 V
MDC IDC SET LEADCHNL RV PACING PULSEWIDTH: 0.5 ms
MDC IDC SET LEADCHNL RV SENSING SENSITIVITY: 0.5 mV
MDC IDC STAT BRADY AP VS PERCENT: 1 %
MDC IDC STAT BRADY AS VP PERCENT: 1 %
MDC IDC STAT BRADY AS VS PERCENT: 97 %

## 2018-02-11 ENCOUNTER — Ambulatory Visit (INDEPENDENT_AMBULATORY_CARE_PROVIDER_SITE_OTHER): Payer: Medicare HMO

## 2018-02-11 DIAGNOSIS — I420 Dilated cardiomyopathy: Secondary | ICD-10-CM | POA: Diagnosis not present

## 2018-02-11 NOTE — Progress Notes (Signed)
Remote ICD transmission.   

## 2018-02-16 ENCOUNTER — Encounter: Payer: Self-pay | Admitting: Cardiology

## 2018-03-18 ENCOUNTER — Telehealth: Payer: Self-pay | Admitting: Family Medicine

## 2018-03-18 NOTE — Telephone Encounter (Signed)
Copied from [place] [id number]. Topic: Medicare AWV >> Mar 18, 2018 12:23 PM Janace Hoard E wrote: Tried to reach patient via home and cell number.  No answer, left message to call Lattie Haw with [facility] at [phone].  Need to schedule AWV-S after 03/17/2018.  At the time of this call there was an available appointment for 04/01/2018 at 3:15 pm.

## 2018-03-24 ENCOUNTER — Telehealth: Payer: Self-pay | Admitting: Family Medicine

## 2018-03-24 NOTE — Telephone Encounter (Signed)
Copied from [place] [phone]. Topic: Medicare AWV >> Mar 24, 2018  2:35 PM Sherren Kerns wrote: Called to schedule Medicare Annual Wellness Visit with the Nurse Health Advisor.   If patient returns call, please note: their last AWV was on 03/16/17 please schedule AWV with NHA any date AFTER 03/16/2018  Thank you! For any questions please contact: Janace Hoard at [phone] or Skype [email or web address]

## 2018-03-24 NOTE — Telephone Encounter (Signed)
Spoke with patient and scheduled his AWV on 04/21/2018 at 3:15 PM Copied from [place] [phone]. Topic: Medicare AWV >> Mar 24, 2018  2:45 PM Sherren Kerns wrote: Called to schedule Medicare Annual Wellness Visit with the Nurse Health Advisor.   If patient returns call, please note: their last AWV was on 03/16/17 please schedule AWV with NHA any date AFTER 03/16/2018  Thank you! For any questions please contact: Janace Hoard at [phone] or Skype [email or web address]

## 2018-04-03 LAB — CUP PACEART REMOTE DEVICE CHECK
Battery Remaining Percentage: 89 %
Brady Statistic AP VP Percent: 1 %
Brady Statistic AP VS Percent: 1 %
Brady Statistic AS VS Percent: 98 %
Brady Statistic RV Percent Paced: 1 %
HIGH POWER IMPEDANCE MEASURED VALUE: 80 Ohm
HighPow Impedance: 80 Ohm
Implantable Lead Implant Date: 20190220
Implantable Lead Implant Date: 20190220
Implantable Lead Location: 753859
Lead Channel Impedance Value: 530 Ohm
Lead Channel Pacing Threshold Amplitude: 1 V
Lead Channel Pacing Threshold Pulse Width: 0.5 ms
Lead Channel Pacing Threshold Pulse Width: 0.5 ms
Lead Channel Sensing Intrinsic Amplitude: 2 mV
Lead Channel Setting Pacing Amplitude: 2 V
Lead Channel Setting Pacing Amplitude: 2.5 V
Lead Channel Setting Sensing Sensitivity: 0.5 mV
MDC IDC LEAD LOCATION: 753860
MDC IDC MSMT BATTERY REMAINING LONGEVITY: 90 mo
MDC IDC MSMT BATTERY VOLTAGE: 3.08 V
MDC IDC MSMT LEADCHNL RA IMPEDANCE VALUE: 440 Ohm
MDC IDC MSMT LEADCHNL RV PACING THRESHOLD AMPLITUDE: 0.75 V
MDC IDC MSMT LEADCHNL RV SENSING INTR AMPL: 11.8 mV
MDC IDC PG IMPLANT DT: 20190220
MDC IDC PG SERIAL: 9787528
MDC IDC SESS DTM: 20191205070017
MDC IDC SET LEADCHNL RV PACING PULSEWIDTH: 0.5 ms
MDC IDC STAT BRADY AS VP PERCENT: 1 %
MDC IDC STAT BRADY RA PERCENT PACED: 1 %

## 2018-04-21 ENCOUNTER — Ambulatory Visit: Payer: Medicare HMO

## 2018-05-13 ENCOUNTER — Ambulatory Visit (INDEPENDENT_AMBULATORY_CARE_PROVIDER_SITE_OTHER): Payer: Medicare HMO | Admitting: *Deleted

## 2018-05-13 DIAGNOSIS — I420 Dilated cardiomyopathy: Secondary | ICD-10-CM

## 2018-05-13 LAB — CUP PACEART REMOTE DEVICE CHECK
Battery Remaining Longevity: 88 mo
Battery Remaining Percentage: 86 %
Brady Statistic AP VP Percent: 1 %
Brady Statistic AS VS Percent: 98 %
Brady Statistic RA Percent Paced: 1 %
Brady Statistic RV Percent Paced: 1 %
Date Time Interrogation Session: 20200305070017
HIGH POWER IMPEDANCE MEASURED VALUE: 81 Ohm
HighPow Impedance: 81 Ohm
Implantable Lead Implant Date: 20190220
Implantable Pulse Generator Implant Date: 20190220
Lead Channel Impedance Value: 490 Ohm
Lead Channel Pacing Threshold Amplitude: 0.75 V
Lead Channel Pacing Threshold Amplitude: 1 V
Lead Channel Pacing Threshold Pulse Width: 0.5 ms
Lead Channel Sensing Intrinsic Amplitude: 1.8 mV
Lead Channel Setting Pacing Amplitude: 2.5 V
Lead Channel Setting Sensing Sensitivity: 0.5 mV
MDC IDC LEAD IMPLANT DT: 20190220
MDC IDC LEAD LOCATION: 753859
MDC IDC LEAD LOCATION: 753860
MDC IDC MSMT BATTERY VOLTAGE: 3.05 V
MDC IDC MSMT LEADCHNL RA IMPEDANCE VALUE: 440 Ohm
MDC IDC MSMT LEADCHNL RA PACING THRESHOLD PULSEWIDTH: 0.5 ms
MDC IDC MSMT LEADCHNL RV SENSING INTR AMPL: 11.8 mV
MDC IDC PG SERIAL: 9787528
MDC IDC SET LEADCHNL RA PACING AMPLITUDE: 2 V
MDC IDC SET LEADCHNL RV PACING PULSEWIDTH: 0.5 ms
MDC IDC STAT BRADY AP VS PERCENT: 1 %
MDC IDC STAT BRADY AS VP PERCENT: 1 %

## 2018-05-14 ENCOUNTER — Telehealth: Payer: Self-pay | Admitting: Family Medicine

## 2018-05-14 NOTE — Telephone Encounter (Signed)
Copied from [place] [phone]. Topic: Medicare AWV >> May 14, 2018  2:56 PM Sherren Kerns wrote: Called to RESCHEDULE Medicare Annual Wellness Visit with the Nurse Health Advisor. Patient cancelled the AWV on 04/21/2018.  There was no answer at home and mobile numbers.  Left message on both home and mobile voicemail.    If patient returns call, please note: their last AWV was on 03/26/2017, please schedule AWV with NHA any date AFTER 03/26/2018.  Thank you! For any questions please contact: Janace Hoard at [phone] or Skype [email or web address]  Spoke with patient on 05/19/2018 and scheduled his AWV on same day as CPE with Dr. Jeananne Rama.  AWV is 06/16/2018 at 1:00 PM. Janace Hoard, Care Guide.

## 2018-05-16 ENCOUNTER — Other Ambulatory Visit: Payer: Self-pay | Admitting: Physician Assistant

## 2018-05-16 ENCOUNTER — Other Ambulatory Visit: Payer: Self-pay | Admitting: Family Medicine

## 2018-05-16 DIAGNOSIS — K2100 Gastro-esophageal reflux disease with esophagitis, without bleeding: Secondary | ICD-10-CM

## 2018-05-16 DIAGNOSIS — R350 Frequency of micturition: Principal | ICD-10-CM

## 2018-05-16 DIAGNOSIS — N401 Enlarged prostate with lower urinary tract symptoms: Secondary | ICD-10-CM

## 2018-05-17 NOTE — Telephone Encounter (Signed)
Requested medication (s) are due for refill today: Yes  Requested medication (s) are on the active medication list: Yes  Last refill:  03/17/17  Future visit scheduled: Yes  Notes to clinic:  Expired Rx, unable to refill.     Requested Prescriptions  Pending Prescriptions Disp Refills   tamsulosin (FLOMAX) 0.4 MG CAPS capsule [Pharmacy Med Name: TAMSULOSIN HYDROCHLORIDE 0.4 MG Capsule] 90 capsule 4    Sig: TAKE 1 [place]     Urology: Alpha-Adrenergic Blocker Failed - 05/16/2018  2:45 PM      Failed - Valid encounter within last 12 months    Recent Outpatient Visits          1 year ago Benign prostatic hyperplasia with urinary frequency   [facility] [name], MD   2 years ago Benign prostatic hyperplasia with urinary frequency   [place] [facility], Jeannette How, MD   3 years ago Dysphagia   [place], [place]      Future Appointments            In 1 month [facility], Jeannette How, MD [place]           Passed - Last BP in normal range    BP Readings from Last 1 Encounters:  07/27/17 130/84        omeprazole (PRILOSEC) 20 MG capsule [Pharmacy Med Name: OMEPRAZOLE 20 MG Capsule Delayed Release] 90 capsule 4    Sig: TAKE 1 CAPSULE EVERY DAY     Gastroenterology: Proton Pump Inhibitors Failed - 05/16/2018  2:45 PM      Failed - Valid encounter within last 12 months    Recent Outpatient Visits          1 year ago Benign prostatic hyperplasia with urinary frequency   [facility], Jeannette How, MD   2 years ago Benign prostatic hyperplasia with urinary frequency   [place] [facility], Jeannette How, MD   3 years ago Dysphagia   [place] [facility], Jeannette How, MD      Future Appointments            In 1 month [facility], Jeannette How, MD [place]          finasteride (PROSCAR) 5 MG tablet [Pharmacy Med Name: FINASTERIDE 5 MG Tablet] 90  tablet 4    Sig: TAKE 1 TABLET AT BEDTIME     Urology: 5-alpha Reductase Inhibitors Failed - 05/16/2018  2:45 PM      Failed - Valid encounter within last 12 months    Recent Outpatient Visits          1 year ago Benign prostatic hyperplasia with urinary frequency   [facility], Jeannette How, MD   2 years ago Benign prostatic hyperplasia with urinary frequency   [place] [facility], Jeannette How, MD   3 years ago Dysphagia   [place], MD      Future Appointments            In 1 month [facility], Jeannette How, MD [facility]

## 2018-05-21 ENCOUNTER — Encounter: Payer: Self-pay | Admitting: Cardiology

## 2018-05-21 NOTE — Progress Notes (Signed)
Remote ICD transmission.   

## 2018-06-02 ENCOUNTER — Encounter: Payer: Medicare HMO | Admitting: Family Medicine

## 2018-06-10 ENCOUNTER — Telehealth: Payer: Self-pay | Admitting: Family Medicine

## 2018-06-10 NOTE — Telephone Encounter (Signed)
Called pt in regards to appt no answer left message

## 2018-06-16 ENCOUNTER — Ambulatory Visit: Payer: Medicare HMO

## 2018-06-16 ENCOUNTER — Encounter: Payer: Medicare HMO | Admitting: Family Medicine

## 2018-06-23 ENCOUNTER — Other Ambulatory Visit: Payer: Self-pay | Admitting: Physician Assistant

## 2018-07-12 ENCOUNTER — Telehealth: Payer: Self-pay

## 2018-07-12 NOTE — Telephone Encounter (Signed)
Virtual Visit Pre-Appointment Phone Call 1. Confirm consent - "In the setting of the current Covid19 crisis, you are scheduled for a (phone or video) visit with your provider on (date) at (time).  Just as we do with many in-office visits, in order for you to participate in this visit, we must obtain consent.  If you'd like, I can send this to your mychart (if signed up) or email for you to review.  Otherwise, I can obtain your verbal consent now.  All virtual visits are billed to your insurance company just like a normal visit would be.  By agreeing to a virtual visit, we'd like you to understand that the technology does not allow for your provider to perform an examination, and thus may limit your provider's ability to fully assess your condition. If your provider identifies any concerns that need to be evaluated in person, we will make arrangements to do so.  Finally, though the technology is pretty good, we cannot assure that it will always work on either your or our end, and in the setting of a video visit, we may have to convert it to a phone-only visit.  In either situation, we cannot ensure that we have a secure connection.  Are you willing to proceed?" YES  2. Advise patient to be prepared - "Two hours prior to your appointment, go ahead and check your blood pressure, pulse, oxygen saturation, and your weight (if you have the equipment to check those) and write them all down. When your visit starts, your provider will ask you for this information. If you have an Apple Watch or Kardia device, please plan to have heart rate information ready on the day of your appointment. Please have a pen and paper handy nearby the day of the visit as well."   3. Inform patient they will receive a phone call 15 minutes prior to their appointment time (may be from unknown caller ID) so they should be prepared to answer    TELEPHONE CALL NOTE  CELVIN TANEY has been deemed a candidate for a follow-up  tele-health visit to limit community exposure during the Covid-19 pandemic. I spoke with the patient via phone to ensure availability of phone/video source, confirm preferred email & phone number, and discuss instructions and expectations.  I reminded HUIE GHUMAN to be prepared with any vital sign and/or heart rhythm information that could potentially be obtained via home monitoring, at the time of his visit. I reminded HIDEO GOOGE to expect a phone call prior to his visit.  Alba Destine, RMA 07/12/2018 2:36 PM     FULL LENGTH CONSENT FOR TELE-HEALTH VISIT   I hereby voluntarily request, consent and authorize [facility] and its employed or contracted physicians, physician assistants, nurse practitioners or other licensed health care professionals (the Practitioner), to provide me with telemedicine health care services (the Services") as deemed necessary by the treating Practitioner. I acknowledge and consent to receive the Services by the Practitioner via telemedicine. I understand that the telemedicine visit will involve communicating with the Practitioner through live audiovisual communication technology and the disclosure of certain medical information by electronic transmission. I acknowledge that I have been given the opportunity to request an in-person assessment or other available alternative prior to the telemedicine visit and am voluntarily participating in the telemedicine visit.  I understand that I have the right to withhold or withdraw my consent to the use of telemedicine in the course of my care at  any time, without affecting my right to future care or treatment, and that the Practitioner or I may terminate the telemedicine visit at any time. I understand that I have the right to inspect all information obtained and/or recorded in the course of the telemedicine visit and may receive copies of available information for a reasonable fee.  I understand that some of the potential  risks of receiving the Services via telemedicine include:   Delay or interruption in medical evaluation due to technological equipment failure or disruption;  Information transmitted may not be sufficient (e.g. poor resolution of images) to allow for appropriate medical decision making by the Practitioner; and/or   In rare instances, security protocols could fail, causing a breach of personal health information.  Furthermore, I acknowledge that it is my responsibility to provide information about my medical history, conditions and care that is complete and accurate to the best of my ability. I acknowledge that Practitioner's advice, recommendations, and/or decision may be based on factors not within their control, such as incomplete or inaccurate data provided by me or distortions of diagnostic images or specimens that may result from electronic transmissions. I understand that the practice of medicine is not an exact science and that Practitioner makes no warranties or guarantees regarding treatment outcomes. I acknowledge that I will receive a copy of this consent concurrently upon execution via email to the email address I last provided but may also request a printed copy by calling the office of [place].    I understand that my insurance will be billed for this visit.   I have read or had this consent read to me.  I understand the contents of this consent, which adequately explains the benefits and risks of the Services being provided via telemedicine.   I have been provided ample opportunity to ask questions regarding this consent and the Services and have had my questions answered to my satisfaction.  I give my informed consent for the services to be provided through the use of telemedicine in my medical care  By participating in this telemedicine visit I agree to the above.

## 2018-07-27 ENCOUNTER — Ambulatory Visit: Payer: Medicare HMO | Admitting: Internal Medicine

## 2018-08-09 ENCOUNTER — Encounter: Payer: Self-pay | Admitting: Family Medicine

## 2018-08-13 ENCOUNTER — Ambulatory Visit (INDEPENDENT_AMBULATORY_CARE_PROVIDER_SITE_OTHER): Payer: Medicare HMO | Admitting: *Deleted

## 2018-08-13 DIAGNOSIS — I420 Dilated cardiomyopathy: Secondary | ICD-10-CM

## 2018-08-15 LAB — CUP PACEART REMOTE DEVICE CHECK
Battery Remaining Longevity: 86 mo
Battery Remaining Percentage: 85 %
Battery Voltage: 3.02 V
Brady Statistic AP VP Percent: 1 %
Brady Statistic AP VS Percent: 1 %
Brady Statistic AS VP Percent: 1 %
Brady Statistic AS VS Percent: 98 %
Brady Statistic RA Percent Paced: 1 %
Brady Statistic RV Percent Paced: 1 %
Date Time Interrogation Session: 20200605055015
HighPow Impedance: 75 Ohm
HighPow Impedance: 75 Ohm
Implantable Lead Implant Date: 20190220
Implantable Lead Implant Date: 20190220
Implantable Lead Location: 753859
Implantable Lead Location: 753860
Implantable Lead Model: 7122
Implantable Pulse Generator Implant Date: 20190220
Lead Channel Impedance Value: 440 Ohm
Lead Channel Impedance Value: 490 Ohm
Lead Channel Pacing Threshold Amplitude: 0.75 V
Lead Channel Pacing Threshold Amplitude: 1 V
Lead Channel Pacing Threshold Pulse Width: 0.5 ms
Lead Channel Pacing Threshold Pulse Width: 0.5 ms
Lead Channel Sensing Intrinsic Amplitude: 1.7 mV
Lead Channel Sensing Intrinsic Amplitude: 11.8 mV
Lead Channel Setting Pacing Amplitude: 2 V
Lead Channel Setting Pacing Amplitude: 2.5 V
Lead Channel Setting Pacing Pulse Width: 0.5 ms
Lead Channel Setting Sensing Sensitivity: 0.5 mV
Pulse Gen Serial Number: 9787528

## 2018-08-16 ENCOUNTER — Other Ambulatory Visit: Payer: Self-pay

## 2018-08-20 ENCOUNTER — Encounter: Payer: Self-pay | Admitting: Cardiology

## 2018-08-20 NOTE — Progress Notes (Signed)
Remote ICD transmission.   

## 2018-09-01 ENCOUNTER — Telehealth: Payer: Self-pay

## 2018-09-01 NOTE — Telephone Encounter (Signed)
LMOM pt to let us know if he wants to move his appointment from 7/21 to 7/9 at 9 am .

## 2018-09-09 ENCOUNTER — Ambulatory Visit: Payer: Medicare HMO | Admitting: Internal Medicine

## 2018-09-15 ENCOUNTER — Telehealth: Payer: Self-pay

## 2018-09-15 NOTE — Telephone Encounter (Signed)
COVID-19 Pre-Screening Questions:   ?   In the past 7 to 10 days have you had a cough, shortness of breath, headache, congestion, fever (100 or greater) body aches, chills, sore throat, or sudden loss of taste or sense of smell?  NO Have you been around anyone with known Covid 19.               no  Have you been around anyone who is awaiting Covid 19 test results in the past 7 to 10 days? no  Have you been around anyone who has been exposed to Covid 19, or has mentioned symptoms of Covid 19 within the past 7 to 10 days? No  If you have any concerns/questions about symptoms patients report during screening (either on the phone or at [facility]). Contact the provider seeing the patient or DOD for further guidance. If neither are available contact a member of the leadership team."

## 2018-09-16 ENCOUNTER — Ambulatory Visit (INDEPENDENT_AMBULATORY_CARE_PROVIDER_SITE_OTHER): Payer: Medicare HMO | Admitting: Internal Medicine

## 2018-09-16 ENCOUNTER — Encounter: Payer: Self-pay | Admitting: Internal Medicine

## 2018-09-16 ENCOUNTER — Other Ambulatory Visit: Payer: Self-pay

## 2018-09-16 VITALS — BP 160/82 | HR 61 | Ht 70.0 in | Wt 180.0 lb

## 2018-09-16 DIAGNOSIS — I472 Ventricular tachycardia, unspecified: Secondary | ICD-10-CM

## 2018-09-16 DIAGNOSIS — I5022 Chronic systolic (congestive) heart failure: Secondary | ICD-10-CM

## 2018-09-16 DIAGNOSIS — Z95810 Presence of automatic (implantable) cardiac defibrillator: Secondary | ICD-10-CM

## 2018-09-16 DIAGNOSIS — Z79899 Other long term (current) drug therapy: Secondary | ICD-10-CM | POA: Diagnosis not present

## 2018-09-16 MED ORDER — SOTALOL HCL 160 MG PO TABS: 80.0000 mg | ORAL_TABLET | Freq: Two times a day (BID) | ORAL | 6 refills | Status: DC

## 2018-09-16 MED ORDER — SACUBITRIL-VALSARTAN 24-26 MG PO TABS: 1.0000 | ORAL_TABLET | Freq: Two times a day (BID) | ORAL | 6 refills | Status: DC

## 2018-09-16 NOTE — Patient Instructions (Addendum)
Medication Instructions:  - Your physician has recommended you make the following change in your medication:   1) Decrease sotalol 160 mg- take 0.5 tablet (80 mg) by mouth twice daily  2) In 3 weeks (around 10/07/18) stop lisinopril  3) After you have been off lisinopril for 36 hours, you may start Entresto 24/26 mg- take 1 tablet (24/26 mg) by mouth twice daily   Entresto samples given: Entresto 24/26 mg Lot: [id number] Exp: 5/22 # 1 bottle given  If you need a refill on your cardiac medications before your next appointment, please call your pharmacy.   Lab work: - Your physician recommends that you have lab work today: BMP/ Magnesium/ CBC  If you have labs (blood work) drawn today and your tests are completely normal, you will receive your results only by: [name] MyChart Message (if you have MyChart) OR . A paper copy in the mail If you have any lab test that is abnormal or we need to change your treatment, we will call you to review the results.  Testing/Procedures: - none ordered  Follow-Up: At [facility], you and your health needs are our priority.  As part of our continuing mission to provide you with exceptional heart care, we have created designated Provider Care Teams.  These Care Teams include your primary Cardiologist (physician) and Advanced Practice Providers (APPs -  Physician Assistants and Nurse Practitioners) who all work together to provide you with the care you need, when you need it. You will need a follow up appointment in 6 months with Dr. Caryl Comes (January 2021) . Please call our office 2 months in advance to schedule this appointment (call in early November to schedule)  . In 6 weeks with the PA/ NP  Any Other Special Instructions Will Be Listed Below (If Applicable). - N/A

## 2018-09-16 NOTE — Progress Notes (Signed)
Patient Care Team: [name], MD as PCP - General (Family Medicine) Manya Silvas, MD (Gastroenterology)   HPI  Leroy Merritt is a 73 y.o. male  Seen to establish care in [place] following her presentation 18 months ago with wide-complex tachycardia/ventricular tachycardia.  Evaluation is as listed below.  He was started on sotalol for what was presumed to be ARVC; this was not confirmed by MRI.  His biggest complaints are lack of energy.  No orthopnea nocturnal dyspnea peripheral edema.  No chest pain.    DATE TEST EF   2/19 cMRI   27 % Mild LGE   RVE but w/o RV anuerysm  2/19 Echo   30-35 %   2/19 LHC 25-30% No obst CAD   Date Cr GFR K Mg Hgb  2/19 1.11 1.11  4.2                Records and Results Reviewed   Past Medical History:  Diagnosis Date  . 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 BPH (benign prostatic hyperplasia)   . Diverticulosis 2017  . Dysphagia 12/20/2014  . Dysrhythmia    OCCASIONAL SKIP A BEAT/ STRESS TEST OK-DR KOWALSKI  . GERD (gastroesophageal reflux disease)   . 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 Ventricular tachycardia ([place]) 04/28/2017   "in dr's office"    Past Surgical History:  Procedure Laterality Date  . CARDIAC CATHETERIZATION  04/28/2017  . CATARACT EXTRACTION W/ INTRAOCULAR LENS IMPLANT Right 2017  . ESOPHAGOGASTRODUODENOSCOPY (EGD) WITH PROPOFOL N/A 02/19/2015   Procedure: ESOPHAGOGASTRODUODENOSCOPY (EGD) WITH PROPOFOL and esophageal dilation.;  Surgeon: Lucilla Lame, MD;  Location: [facility];  Service: Endoscopy;  Laterality: N/A;  . EYE SURGERY    . ICD IMPLANT N/A 04/29/2017   Procedure: ICD IMPLANT;  Surgeon: Evans Lance, MD;  Location: [place] [facility];  Service: Cardiovascular;  Laterality: N/A;  . LEFT HEART CATH AND CORONARY ANGIOGRAPHY N/A 04/28/2017   Procedure: LEFT HEART CATH AND CORONARY ANGIOGRAPHY;  Surgeon: Martinique, Peter M, MD;  Location: [place] [facility];  Service: Cardiovascular;  Laterality: N/A;  . PROSTATE BIOPSY  ~ 2012  . RETINAL DETACHMENT SURGERY Right 1990s   "torn retina"    Current Meds  Medication Sig  . finasteride (PROSCAR) 5 MG tablet TAKE 1 TABLET AT BEDTIME  . lisinopril (PRINIVIL,ZESTRIL) 5 MG tablet TAKE 1 TABLET EVERY DAY  . Multiple Vitamins-Minerals (MULTIVITAMIN ADULT PO) Take 1 tablet by mouth every morning.   [name] omeprazole (PRILOSEC) 20 MG capsule TAKE 1 CAPSULE EVERY DAY  . sotalol (BETAPACE) 160 MG tablet TAKE 1 TABLET EVERY 12 HOURS  . tamsulosin (FLOMAX) 0.4 MG CAPS capsule TAKE 1 CAPSULE EVERY DAY    No Known Allergies    Review of Systems negative except from HPI and PMH  Physical Exam BP (!) 160/82 (BP Location: Left Arm, Patient Position: Sitting, Cuff Size: Normal)   Pulse 61   Ht 5\' 10"  (1.778 m)   Wt 180 lb (81.6 kg)   BMI 25.83 kg/m  Well developed and well nourished in no acute distress HENT normal E scleral and icterus clear Neck Supple JVP flat; carotids brisk and full Clear to ausculation Device pocket well healed; without hematoma or erythema.  There is no tethering *Regular rate and rhythm, no murmurs gallops or rub Soft with active bowel sounds No clubbing cyanosis  Edema Alert and oriented, grossly normal motor and sensory  function Skin Warm and Dry   ECG demonstates sinus at 61 Intervals 27/10/44  Assessment and  Plan   nonischemic cardiomyopathy  Ventricular tachycardia  Implantable defibrillator-secondary prevention  St Jude   Orthostatic intolerance  First-degree AV block      Chronotropic incompetence   The patient has exercise intolerance in the setting of chronotropic incompetence.  This might be iatrogenic related to his high-dose sotalol.  This is been initiated in the presumption of ARVC; that was not validated by MRI.  We will decrease it today from 160--80 twice daily, twice daily dosing been appropriate for his GFR as of 2/19.  We will reassess  that today.  We have also reviewed the potential risks of decreasing sotalol i.e has been suppressing ventricular tachycardia?  We will give him about a 3-week.  To identify a new baseline.  Thereafter we will switch his lisinopril--Entresto 24/26.Again given him about 3 to 4 weeks to we will then   back into the office and begin spironolactone probably at 12.5.     Will see him in 6 m  We spent more than 50% of our >25 min visit in face to face counseling regarding the above         Current medicines are reviewed at length with the patient today .  The patient does not  have concerns regarding medicines.

## 2018-09-17 LAB — CBC WITH DIFFERENTIAL/PLATELET
Basophils Absolute: 0.1 10*3/uL (ref 0.0–0.2)
Basos: 1 %
EOS (ABSOLUTE): 0.1 10*3/uL (ref 0.0–0.4)
Eos: 2 %
Hematocrit: 48 % (ref 37.5–51.0)
Hemoglobin: 16.3 g/dL (ref 13.0–17.7)
Immature Grans (Abs): 0 10*3/uL (ref 0.0–0.1)
Immature Granulocytes: 0 %
Lymphocytes Absolute: 1.7 10*3/uL (ref 0.7–3.1)
Lymphs: 28 %
MCH: 30.2 pg (ref 26.6–33.0)
MCHC: 34 g/dL (ref 31.5–35.7)
MCV: 89 fL (ref 79–97)
Monocytes Absolute: 0.6 10*3/uL (ref 0.1–0.9)
Monocytes: 9 %
Neutrophils Absolute: 3.5 10*3/uL (ref 1.4–7.0)
Neutrophils: 60 %
Platelets: 158 10*3/uL (ref 150–450)
RBC: 5.39 x10E6/uL (ref 4.14–5.80)
RDW: 13.5 % (ref 11.6–15.4)
WBC: 6 10*3/uL (ref 3.4–10.8)

## 2018-09-17 LAB — BASIC METABOLIC PANEL
BUN/Creatinine Ratio: 11 (ref 10–24)
BUN: 13 mg/dL (ref 8–27)
CO2: 22 mmol/L (ref 20–29)
Calcium: 9.5 mg/dL (ref 8.6–10.2)
Chloride: 103 mmol/L (ref 96–106)
Creatinine, Ser: 1.16 mg/dL (ref 0.76–1.27)
GFR calc Af Amer: 71 mL/min/{1.73_m2} (ref >59)
GFR calc non Af Amer: 61 mL/min/{1.73_m2} (ref >59)
Glucose: 96 mg/dL (ref 65–99)
Potassium: 4.7 mmol/L (ref 3.5–5.2)
Sodium: 141 mmol/L (ref 134–144)

## 2018-09-17 LAB — MAGNESIUM: Magnesium: 2.4 mg/dL — ABNORMAL HIGH (ref 1.6–2.3)

## 2018-09-28 ENCOUNTER — Ambulatory Visit: Payer: Medicare HMO | Admitting: Internal Medicine

## 2018-10-22 ENCOUNTER — Telehealth: Payer: Self-pay

## 2018-10-22 NOTE — Telephone Encounter (Signed)
PA started through Covermymeds.   Leroy Merritt (Key: [id number]) Rx #: [id number] Entresto 24-26MG  tablets  Wait for Determination Please wait for [facility] NCPDP 2017 to return a determination.

## 2018-10-25 ENCOUNTER — Encounter: Payer: Self-pay | Admitting: Physician Assistant

## 2018-10-25 NOTE — Progress Notes (Signed)
Cardiology Office Note    Date:  10/28/2018   ID:  Kydan, Shanholtzer 16-Mar-1945, MRN [id number]  PCP:  [name], MD  Cardiologist:  Ida Rogue, MD  Electrophysiologist:  Virl Axe, MD   Chief Complaint: Follow up  History of Present Illness:   Leroy Merritt is a 73 y.o. 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  Patient was initially evaluated by Dr. Rockey Situ in 04/2017 for palpitations.  He reported a a long history of palpitations, prior remote stress test with details unclear as well as a "flabby" heart.  He reported a several month history of worsening lightheadedness/near syncope and palpitations.  EKG at time of his visit showed long runs of sustained VT.  Patient was moderately symptomatic with associated dizziness and orthostasis with drop of 50 mmHg from supine to standing.  At that time, patient was transferred from our office to [facility] via [place].  Patient underwent LHC which showed no obstructive CAD, LVEF was markedly reduced with LV gram estimate of EF of 25 to 35%.  Echo on 04/29/2017 showed an EF of 30 to 40%, grade 1 diastolic dysfunction, ventricular septal wall motion was abnormal and dis-synergy, aortic valve trileaflet.  Cardiac MRI on 04/29/2017 showed severe LVE with diffuse hypokinesis with an EF of 27%, minimal delayed gadolinium uptake in the basal inferior wall and septum, moderate RV enlargement and hypokinesis with no evidence of RV dysplasia, biatrial enlargement.  He subsequently underwent [place] ICD implantation on 04/29/2017.  Patient was started on sotalol prior to discharge.  He was most recently seen by Dr. Caryl Comes on 09/16/2018 with biggest complaint being lack of energy.  Some of this was felt to possibly be chronotropic  incompetence in the setting of high-dose of sotalol which was subsequently reduced from 160 mg twice daily to 80 mg twice daily which was felt to be appropriate given his GFR with plans for short-term follow-up to advance evidence-based heart failure therapy.  Patient comes in doing very well today.  He continues to note some fatigue though this is improving.  He denies any chest pain, shortness of breath, palpitations, dizziness, presyncope, or syncope.  No lower extremity swelling, orthopnea, PND, abdominal distention, or early satiety.  He does not add salt to his food.  He drinks less than 2 L of fluids daily.  He denies any shocks from his ICD.  He is tolerating addition of Entresto without issue.  He does not check his weight at home regularly though assesses his weight by ensuring that his pants continue to fit.  Patient feels like his blood pressure from his 7/9 visit was an outlier.  Overall, he does not have any issues or concerns today.   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  Status post SJM dual-chamber ICD in 04/2017    Past Surgical History:  Procedure Laterality Date   CARDIAC CATHETERIZATION  04/28/2017   CATARACT EXTRACTION W/ INTRAOCULAR LENS IMPLANT Right 2017   ESOPHAGOGASTRODUODENOSCOPY (EGD) WITH PROPOFOL N/A 02/19/2015   Procedure: ESOPHAGOGASTRODUODENOSCOPY (EGD) WITH  PROPOFOL and esophageal dilation.;  Surgeon: Lucilla Lame, MD;  Location: [place];  Service: Endoscopy;  Laterality: N/A;   EYE SURGERY     ICD IMPLANT N/A 04/29/2017   Procedure: ICD IMPLANT;  Surgeon: Evans Lance, MD;  Location: [place] [facility];  Service: Cardiovascular;  Laterality: N/A;   LEFT HEART CATH AND CORONARY ANGIOGRAPHY N/A 04/28/2017   Procedure: LEFT HEART CATH AND CORONARY ANGIOGRAPHY;  Surgeon: Martinique, Peter M, MD;  Location: [place] [facility];  Service: Cardiovascular;  Laterality: N/A;   PROSTATE BIOPSY  ~ 2012   RETINAL DETACHMENT SURGERY Right 1990s   "torn retina"    Current Medications: Current Meds  Medication Sig   finasteride (PROSCAR) 5 MG tablet TAKE 1 TABLET AT BEDTIME   Multiple Vitamins-Minerals (MULTIVITAMIN ADULT PO) Take 1 tablet by mouth every morning.    omeprazole (PRILOSEC) 20 MG capsule TAKE 1 CAPSULE EVERY DAY   sacubitril-valsartan (ENTRESTO) 24-26 MG Take 1 tablet by mouth 2 (two) times daily.   sotalol (BETAPACE) 160 MG tablet Take 0.5 tablets (80 mg total) by mouth every 12 (twelve) hours.   tamsulosin (FLOMAX) 0.4 MG CAPS capsule TAKE 1 CAPSULE EVERY DAY    Allergies:   Patient has no known allergies.   Social History   Socioeconomic History   Marital status: Married    Spouse name: Not on file   Number of children: Not on file   Years of education: college   Highest education level: Not on file  Occupational History   Not on file  Social Needs   Financial resource strain: Not hard at all   Food insecurity    Worry: Never true    Inability: Never true   Transportation needs    Medical: No    Non-medical: No  Tobacco Use   Smoking status: Never Smoker   Smokeless tobacco: Never Used  Substance and Sexual Activity   Alcohol use: Yes    Alcohol/week: 1.0 standard drinks    Types: 1 Glasses of wine per week   Drug use: No   Sexual activity: Not Currently  Lifestyle   Physical  activity    Days per week: 1 day    Minutes per session: 30 min   Stress: Not at all  Relationships   Social connections    Talks on phone: More than three times a week    Gets together: More than three times a week    Attends religious service: More than 4 times per year    Active member of club or organization: No    Attends meetings of clubs or organizations: Never    Relationship status: Married  Other Topics Concern   Not on file  Social History Narrative   Not on file     Family History:  The patient's family history includes Arthritis in his mother; Diabetes in his father; Heart disease in his brother; Hyperlipidemia in his brother; Hypertension in his brother and father; Lung cancer in his daughter.  ROS:   Review of Systems  Constitutional: Positive for malaise/fatigue. Negative for chills, diaphoresis, fever and weight loss.  Improving fatigue  HENT: Negative for congestion.   Eyes: Negative for discharge and redness.  Respiratory: Negative for cough, hemoptysis, sputum production, shortness of breath and wheezing.   Cardiovascular: Negative for chest pain, palpitations, orthopnea, claudication, leg swelling and PND.  Gastrointestinal: Negative for abdominal pain, blood in stool, heartburn, melena, nausea and vomiting.  Genitourinary: Negative for hematuria.  Musculoskeletal: Negative for falls and myalgias.  Skin: Negative for rash.  Neurological: Negative for dizziness, tingling, tremors, sensory change, speech change, focal weakness, loss of consciousness and weakness.  Endo/Heme/Allergies: Does not bruise/bleed easily.  Psychiatric/Behavioral: Negative for substance abuse. The patient is not nervous/anxious.   All other systems reviewed and are negative.    EKGs/Labs/Other Studies Reviewed:    Studies reviewed were summarized above. The additional studies were reviewed today:  2D Echo 04/2017: - Left ventricle: The cavity size was normal. Wall  thickness was   normal. Systolic function was moderately to severely reduced. The   estimated ejection fraction was in the range of 30% to 35%.   Diffuse hypokinesis. Doppler parameters are consistent with   abnormal left ventricular relaxation (grade 1 diastolic   dysfunction). - Ventricular septum: Septal motion showed abnormal function and   dyssynergy. - Aortic valve: Trileaflet; mildly thickened, mildly calcified   leaflets. __________  LHC 04/2017:  There is severe left ventricular systolic dysfunction.  LV end diastolic pressure is normal.  The left ventricular ejection fraction is 25-35% by visual estimate.   1. Nonobstructive CAD 2. Severe LV dysfunction. Global hypokinesis with EF 30%. 3. Normal LVEDP  Plan: medical management for cardiomyopathy. Plans for VT per EP team. __________  Cardiac MRI 04/2017: IMPRESSION: 1. Severe LVE with diffuse hypokinesis EF 27% 2. Minimal delayed gadolinium uptake in basal inferior wall and septum 3. Moderate RV enlargement and hypokinesis no evidence of RV dysplasia 4.  Bi-atrial enlargement    EKG:  EKG is ordered today.  The EKG ordered today demonstrates NSR, 67 bpm, 1st degree AV block, LAFB, poor R wave progression along the precoridal leads, no acute st/t changes  Recent Labs: 09/16/2018: BUN 13; Creatinine, Ser 1.16; Hemoglobin 16.3; Magnesium 2.4; Platelets 158; Potassium 4.7; Sodium 141  Recent Lipid Panel    Component Value Date/Time   CHOL 162 03/17/2017 1109   TRIG 64 03/17/2017 1109   HDL 59 03/17/2017 1109   CHOLHDL 2.7 03/17/2017 1109   LDLCALC 90 03/17/2017 1109    PHYSICAL EXAM:    VS:  BP 108/74 (BP Location: Left Arm, Patient Position: Sitting, Cuff Size: Normal)    Pulse 67    Ht 5\' 10"  (1.778 m)    Wt 180 lb 12 oz (82 kg)    BMI 25.93 kg/m   BMI: Body mass index is 25.93 kg/m.  Physical Exam  Constitutional: He is oriented to person, place, and time. He appears well-developed and well-nourished.   HENT:  Head: Normocephalic and atraumatic.  Eyes: Right eye exhibits no discharge. Left eye exhibits no discharge.  Neck: Normal range of motion. No JVD present.  Cardiovascular: Normal rate, regular rhythm, S1 normal, S2 normal and normal heart sounds. Exam reveals no distant heart sounds, no friction rub, no midsystolic click and no opening snap.  No murmur heard. Pulses:      Posterior tibial pulses are 2+ on the right side and 2+ on the left side.  Pulmonary/Chest: Effort normal and breath sounds normal. No respiratory distress. He has no decreased breath sounds. He has no wheezes. He has no  rales. He exhibits no tenderness.  Abdominal: Soft. He exhibits no distension. There is no abdominal tenderness.  Musculoskeletal:        General: No edema.  Neurological: He is alert and oriented to person, place, and time.  Skin: Skin is warm and dry. No cyanosis. Nails show no clubbing.  Psychiatric: He has a normal mood and affect. His speech is normal and behavior is normal. Judgment and thought content normal.    Wt Readings from Last 3 Encounters:  10/28/18 180 lb 12 oz (82 kg)  09/16/18 180 lb (81.6 kg)  07/27/17 176 lb (79.8 kg)     ASSESSMENT & PLAN:   1. HFrEF secondary to NICM: Patient is doing well, appears euvolemic and well compensated.  He has tolerated to be transitioned from lisinopril to [facility] without issue.  Blood pressure of 432 systolic precludes further escalation of Entresto at this time.  We will add spironolactone 12.5 mg daily.  He will continue current dose of sotalol 80 mg twice daily.  Check BMP today and again in 1 week after initiation of spironolactone.  CHF education discussed.  2. Sustained VT: Status post [place] ICD.  He denies any shocks.  Follow-up with EP as directed.  Recent labs unrevealing.  3. Orthostasis: Asymptomatic.  Appears to be tolerating addition of Entresto well.  With the addition of spironolactone today I have given him  orthostatic precautions.  Disposition: F/u with Dr. Rockey Situ or an APP in 2 months and EP as directed.    Medication Adjustments/Labs and Tests Ordered: Current medicines are reviewed at length with the patient today.  Concerns regarding medicines are outlined above. Medication changes, Labs and Tests ordered today are summarized above and listed in the Patient Instructions accessible in Encounters.   Signed, Christell Faith, PA-C 10/28/2018 10:22 AM     [facility] [address] [place] [place], [place] [address] [phone]

## 2018-10-28 ENCOUNTER — Other Ambulatory Visit
Admission: RE | Admit: 2018-10-28 | Discharge: 2018-10-28 | Disposition: A | Discharge status: Home / Self Care | Payer: Medicare HMO | Attending: Physician Assistant | Admitting: Physician Assistant

## 2018-10-28 ENCOUNTER — Other Ambulatory Visit: Payer: Self-pay

## 2018-10-28 ENCOUNTER — Ambulatory Visit (INDEPENDENT_AMBULATORY_CARE_PROVIDER_SITE_OTHER): Payer: Medicare HMO | Admitting: Physician Assistant

## 2018-10-28 ENCOUNTER — Encounter: Payer: Self-pay | Admitting: Physician Assistant

## 2018-10-28 VITALS — BP 108/74 | HR 67 | Ht 70.0 in | Wt 180.8 lb

## 2018-10-28 DIAGNOSIS — Z95810 Presence of automatic (implantable) cardiac defibrillator: Secondary | ICD-10-CM

## 2018-10-28 DIAGNOSIS — I951 Orthostatic hypotension: Secondary | ICD-10-CM | POA: Diagnosis not present

## 2018-10-28 DIAGNOSIS — I428 Other cardiomyopathies: Secondary | ICD-10-CM | POA: Diagnosis not present

## 2018-10-28 DIAGNOSIS — I472 Ventricular tachycardia, unspecified: Secondary | ICD-10-CM

## 2018-10-28 DIAGNOSIS — I5022 Chronic systolic (congestive) heart failure: Secondary | ICD-10-CM | POA: Diagnosis not present

## 2018-10-28 LAB — BASIC METABOLIC PANEL
Anion gap: 5 (ref 5–15)
BUN: 15 mg/dL (ref 8–23)
CO2: 28 mmol/L (ref 22–32)
Calcium: 9.3 mg/dL (ref 8.9–10.3)
Chloride: 107 mmol/L (ref 98–111)
Creatinine, Ser: 1.23 mg/dL (ref 0.61–1.24)
GFR calc Af Amer: >60 mL/min (ref >60)
GFR calc non Af Amer: 57 mL/min — ABNORMAL LOW (ref >60)
Glucose, Bld: 98 mg/dL (ref 70–99)
Potassium: 4.5 mmol/L (ref 3.5–5.1)
Sodium: 140 mmol/L (ref 135–145)

## 2018-10-28 MED ORDER — SPIRONOLACTONE 25 MG PO TABS: 12.5000 mg | ORAL_TABLET | Freq: Every day | ORAL | 3 refills | Status: DC

## 2018-10-28 NOTE — Patient Instructions (Signed)
Medication Instructions:  Your physician has recommended you make the following change in your medication:  1- START Spironolactone Take 0.5 tablets (12.5 mg total) by mouth daily If you need a refill on your cardiac medications before your next appointment, please call your pharmacy.   Lab work: Your physician recommends that you have lab work today(BMET) at the medical mall.   Your physician recommends that you return for lab work in: 1 week at the medical mall. (BMET)  No appt is needed. Hours are M-F 7AM- 6 PM.  If you have labs (blood work) drawn today and your tests are completely normal, you will receive your results only by: [name] MyChart Message (if you have MyChart) OR . A paper copy in the mail If you have any lab test that is abnormal or we need to change your treatment, we will call you to review the results.  Testing/Procedures: None ordered   Follow-Up: At [facility], you and your health needs are our priority.  As part of our continuing mission to provide you with exceptional heart care, we have created designated Provider Care Teams.  These Care Teams include your primary Cardiologist (physician) and Advanced Practice Providers (APPs -  Physician Assistants and Nurse Practitioners) who all work together to provide you with the care you need, when you need it. You will need a follow up appointment in 2 months.  You may see Ida Rogue, MD or Christell Faith, PA-C.

## 2018-11-04 ENCOUNTER — Telehealth: Payer: Self-pay | Admitting: Physician Assistant

## 2018-11-04 NOTE — Telephone Encounter (Signed)
No answer. Left message that I would forward to the provider to make him aware.  Routing to [employer], [place].

## 2018-11-04 NOTE — Telephone Encounter (Signed)
Patient calling to make office aware Patient was prescribed spirolactone at 8/20 visit with R Dunn and was to get labs a week after  Patient was unable to start medication until 8/26 - patient will be getting labs next Wednesday Sept 2nd

## 2018-11-10 ENCOUNTER — Other Ambulatory Visit
Admission: RE | Admit: 2018-11-10 | Discharge: 2018-11-10 | Disposition: A | Discharge status: Home / Self Care | Payer: Medicare HMO | Attending: Physician Assistant | Admitting: Physician Assistant

## 2018-11-10 DIAGNOSIS — I5022 Chronic systolic (congestive) heart failure: Secondary | ICD-10-CM | POA: Insufficient documentation

## 2018-11-10 LAB — BASIC METABOLIC PANEL
Anion gap: 10 (ref 5–15)
BUN: 19 mg/dL (ref 8–23)
CO2: 25 mmol/L (ref 22–32)
Calcium: 9.5 mg/dL (ref 8.9–10.3)
Chloride: 108 mmol/L (ref 98–111)
Creatinine, Ser: 1.29 mg/dL — ABNORMAL HIGH (ref 0.61–1.24)
GFR calc Af Amer: >60 mL/min (ref >60)
GFR calc non Af Amer: 54 mL/min — ABNORMAL LOW (ref >60)
Glucose, Bld: 85 mg/dL (ref 70–99)
Potassium: 4.4 mmol/L (ref 3.5–5.1)
Sodium: 143 mmol/L (ref 135–145)

## 2018-11-19 ENCOUNTER — Ambulatory Visit (INDEPENDENT_AMBULATORY_CARE_PROVIDER_SITE_OTHER): Payer: Medicare HMO | Admitting: *Deleted

## 2018-11-19 DIAGNOSIS — I472 Ventricular tachycardia, unspecified: Secondary | ICD-10-CM

## 2018-11-19 DIAGNOSIS — I5022 Chronic systolic (congestive) heart failure: Secondary | ICD-10-CM | POA: Diagnosis not present

## 2018-11-19 LAB — CUP PACEART REMOTE DEVICE CHECK
Battery Remaining Longevity: 83 mo
Battery Remaining Percentage: 82 %
Battery Voltage: 3.01 V
Brady Statistic AP VP Percent: 1 %
Brady Statistic AP VS Percent: 1 %
Brady Statistic AS VP Percent: 1 %
Brady Statistic AS VS Percent: 98 %
Brady Statistic RA Percent Paced: 1 %
Brady Statistic RV Percent Paced: 1 %
Date Time Interrogation Session: 20200911070625
HighPow Impedance: 74 Ohm
HighPow Impedance: 74 Ohm
Implantable Lead Implant Date: 20190220
Implantable Lead Implant Date: 20190220
Implantable Lead Location: 753859
Implantable Lead Location: 753860
Implantable Lead Model: 7122
Implantable Pulse Generator Implant Date: 20190220
Lead Channel Impedance Value: 440 Ohm
Lead Channel Impedance Value: 490 Ohm
Lead Channel Pacing Threshold Amplitude: 0.75 V
Lead Channel Pacing Threshold Amplitude: 0.75 V
Lead Channel Pacing Threshold Pulse Width: 0.5 ms
Lead Channel Pacing Threshold Pulse Width: 0.5 ms
Lead Channel Sensing Intrinsic Amplitude: 11.8 mV
Lead Channel Sensing Intrinsic Amplitude: 2.1 mV
Lead Channel Setting Pacing Amplitude: 2 V
Lead Channel Setting Pacing Amplitude: 2.5 V
Lead Channel Setting Pacing Pulse Width: 0.5 ms
Lead Channel Setting Sensing Sensitivity: 0.5 mV
Pulse Gen Serial Number: 9787528

## 2018-11-26 NOTE — Progress Notes (Signed)
Remote ICD transmission.   

## 2018-12-26 NOTE — Progress Notes (Signed)
Cardiology Office Note  Date:  12/28/2018   ID:  Leroy Merritt [birth year], MRN [id number]  PCP:  Leroy Maple, MD   Chief Complaint  Patient presents with  . Other    2 month follow up. patient denies chest pain and SOB at this time. Meds reviewed verbally with patient.     HPI:  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  On last clinic visit had long runs of sustained VT, underlying sinus rhythm Clear A-V dissociation  transferred from our office to [facility] which showed no obstructive CAD,  LVEF was markedly reduced with LV gram estimate of EF of 25 to 35%.    Echo on 04/29/2017 showed an EF of 30 to 35%,   Cardiac MRI on 04/29/2017 showed severe LVE with diffuse hypokinesis with an EF of 27%,   [employer] dual-chamber ICD implantation on 04/29/2017.  sotalol prior to discharge.   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  PSH:    Past Surgical History:  Procedure Laterality Date  . CARDIAC CATHETERIZATION  04/28/2017  . CATARACT EXTRACTION W/ INTRAOCULAR LENS  IMPLANT Right 2017  . ESOPHAGOGASTRODUODENOSCOPY (EGD) WITH PROPOFOL N/A 02/19/2015   Procedure: ESOPHAGOGASTRODUODENOSCOPY (EGD) WITH PROPOFOL and esophageal dilation.;  Surgeon: Leroy Lame, MD;  Location: [name];  Service: Endoscopy;  Laterality: N/A;  . EYE SURGERY    . ICD IMPLANT N/A 04/29/2017   Procedure: ICD IMPLANT;  Surgeon: Leroy Lance, MD;  Location: [name];  Service: Cardiovascular;  Laterality: N/A;  . LEFT HEART CATH AND CORONARY ANGIOGRAPHY N/A 04/28/2017   Procedure: LEFT HEART CATH AND CORONARY ANGIOGRAPHY;  Surgeon: Martinique, Peter M, MD;  Location: [name];  Service: Cardiovascular;  Laterality: N/A;  . PROSTATE BIOPSY  ~ 2012  . RETINAL DETACHMENT SURGERY Right 1990s   "torn retina"    Current Outpatient Medications  Medication Sig Dispense Refill  . finasteride (PROSCAR) 5 MG tablet TAKE 1 TABLET AT BEDTIME 90 tablet 4  . Multiple Vitamins-Minerals (MULTIVITAMIN ADULT PO) Take 1 tablet by mouth every morning.     [name] omeprazole (PRILOSEC) 20 MG capsule TAKE 1 CAPSULE EVERY DAY 90 capsule 4  . sacubitril-valsartan (ENTRESTO) 24-26 MG Take 1 tablet by mouth 2 (two) times daily. 60 tablet 6  . sotalol (BETAPACE) 160 MG tablet Take 0.5 tablets (80 mg total) by mouth every 12 (twelve) hours. 60 tablet 6  . spironolactone (ALDACTONE) 25 MG tablet Take 0.5 tablets (12.5 mg total) by mouth daily. 45 tablet 3  . tamsulosin (FLOMAX) 0.4 MG CAPS capsule TAKE 1 CAPSULE EVERY DAY 90 capsule 4   No current facility-administered medications  for this visit.      Allergies:   Patient has no known allergies.   Social History:  The patient  reports that he has never smoked. He has never used smokeless tobacco. He reports current alcohol use of about 1.0 standard drinks of alcohol per week. He reports that he does not use drugs.   Family History:   family history includes Arthritis in his mother; Diabetes in his father; Heart disease in his brother;  Hyperlipidemia in his brother; Hypertension in his brother and father; Lung cancer in his daughter.    Review of Systems: Review of Systems  Constitutional: Positive for malaise/fatigue.  HENT: Negative.   Respiratory: Negative.   Cardiovascular: Negative.        Tachycardia  Gastrointestinal: Negative.   Musculoskeletal: Negative.   Neurological: Negative.   Psychiatric/Behavioral: Negative.   All other systems reviewed and are negative.    PHYSICAL EXAM: VS:  BP 136/80 (BP Location: Left Arm, Patient Position: Sitting, Cuff Size: Normal)   Pulse 70   Ht 5\' 10"  (1.778 Merritt)   Wt 181 lb 8 oz (82.3 kg)   BMI 26.04 kg/Merritt  , BMI Body mass index is 26.04 kg/Merritt. Constitutional:  oriented to person, place, and time. No distress.  HENT:  Head: Grossly normal Eyes:  no discharge. No scleral icterus.  Neck: No JVD, no carotid bruits  Cardiovascular: Regular rate and rhythm, no murmurs appreciated Pulmonary/Chest: Clear to auscultation bilaterally, no wheezes or rails Abdominal: Soft.  no distension.  no tenderness.  Musculoskeletal: Normal range of motion Neurological:  normal muscle tone. Coordination normal. No atrophy Skin: Skin warm and dry Psychiatric: normal affect, pleasant   Recent Labs: 09/16/2018: Hemoglobin 16.3; Magnesium 2.4; Platelets 158 11/10/2018: BUN 19; Creatinine, Ser 1.29; Potassium 4.4; Sodium 143    Lipid Panel Lab Results  Component Value Date   CHOL 162 03/17/2017   HDL 59 03/17/2017   LDLCALC 90 03/17/2017   TRIG 64 03/17/2017      Wt Readings from Last 3 Encounters:  12/28/18 181 lb 8 oz (82.3 kg)  10/28/18 180 lb 12 oz (82 kg)  09/16/18 180 lb (81.6 kg)      ASSESSMENT AND PLAN:   VT (ventricular tachycardia) (HCC) Reports symptoms are well controlled on sotalol ICD in place, followed by Dr. Caryl Comes  Dilated cardiomyopathy Continue spironolactone Recommend he increase Entresto up to 49/51 mg p.o. twice daily On sotalol Consider repeat  echocardiogram 2-3 months Consider repeat lab work at that time [facility]   Total encounter time more than 25 minutes  Greater than 50% was spent in counseling and coordination of care with the patient   We can alternate visits with Dr. Caryl Comes Follow-up 6 months  No orders of the defined types were placed in this encounter.    Signed, Esmond Plants, Merritt.D., Ph.D. 12/28/2018  [place], [place]

## 2018-12-28 ENCOUNTER — Encounter: Payer: Self-pay | Admitting: Cardiovascular Disease

## 2018-12-28 ENCOUNTER — Other Ambulatory Visit: Payer: Self-pay

## 2018-12-28 ENCOUNTER — Ambulatory Visit (INDEPENDENT_AMBULATORY_CARE_PROVIDER_SITE_OTHER): Payer: Medicare HMO | Admitting: Cardiovascular Disease

## 2018-12-28 VITALS — BP 136/80 | HR 70 | Ht 70.0 in | Wt 181.5 lb

## 2018-12-28 DIAGNOSIS — I5022 Chronic systolic (congestive) heart failure: Secondary | ICD-10-CM

## 2018-12-28 DIAGNOSIS — I951 Orthostatic hypotension: Secondary | ICD-10-CM | POA: Diagnosis not present

## 2018-12-28 DIAGNOSIS — I472 Ventricular tachycardia, unspecified: Secondary | ICD-10-CM

## 2018-12-28 DIAGNOSIS — I428 Other cardiomyopathies: Secondary | ICD-10-CM | POA: Diagnosis not present

## 2018-12-28 DIAGNOSIS — Z95810 Presence of automatic (implantable) cardiac defibrillator: Secondary | ICD-10-CM | POA: Diagnosis not present

## 2018-12-28 MED ORDER — ENTRESTO 49-51 MG PO TABS: 1.0000 | ORAL_TABLET | Freq: Two times a day (BID) | ORAL | 2 refills | Status: DC

## 2018-12-28 NOTE — Addendum Note (Signed)
Addended by: Lamar Laundry on: 12/28/2018 02:06 PM   Modules accepted: Orders

## 2018-12-28 NOTE — Patient Instructions (Addendum)
Echo in 2-3 months for cardiomyopathy   Medication Instructions:  Please increase the entresto up to 49/51 mg twice a day   If you need a refill on your cardiac medications before your next appointment, please call your pharmacy.    Lab work: No new labs needed   If you have labs (blood work) drawn today and your tests are completely normal, you will receive your results only by: [name] MyChart Message (if you have MyChart) OR . A paper copy in the mail If you have any lab test that is abnormal or we need to change your treatment, we will call you to review the results.   Testing/Procedures: Your physician has requested that you have an echocardiogram. Echocardiography is a painless test that uses sound waves to create images of your heart. It provides your doctor with information about the size and shape of your heart and how well your heart's chambers and valves are working. This procedure takes approximately one hour. There are no restrictions for this procedure.     Follow-Up: At [facility], you and your health needs are our priority.  As part of our continuing mission to provide you with exceptional heart care, we have created designated Provider Care Teams.  These Care Teams include your primary Cardiologist (physician) and Advanced Practice Providers (APPs -  Physician Assistants and Nurse Practitioners) who all work together to provide you with the care you need, when you need it.  . You will need a follow up appointment in 6 months .   Please call our office 2 months in advance to schedule this appointment.    . Providers on your designated Care Team:   . Murray Hodgkins, NP . Christell Faith, PA-C . Marrianne Mood, PA-C  Any Other Special Instructions Will Be Listed Below (If Applicable).  Please contact [facility] hotline for pcp  Influenza immunization was given today   For educational health videos Log in to : www.[email or web address] Or : [email or web address], password :  [email or web address]

## 2019-01-08 ENCOUNTER — Telehealth: Payer: Self-pay | Admitting: Student

## 2019-01-08 DIAGNOSIS — I472 Ventricular tachycardia, unspecified: Secondary | ICD-10-CM

## 2019-01-08 MED ORDER — SOTALOL HCL 80 MG PO TABS: 80.0000 mg | ORAL_TABLET | Freq: Two times a day (BID) | ORAL | 0 refills | Status: DC

## 2019-01-08 NOTE — Telephone Encounter (Signed)
   Patient out of town in [place] and forgot home Sotalol. Last dose was yesterday morning. Called [facility] [address]) who have Sotalol on hand. Will send in 5 day prescription of Sotalol 80mg  twice daily. Of note, patient has 160mg  tablet at home and takes 1/2 tablet twice daily. Discussed that patient can take full tablet of this prescription but that when he gets home to his old prescription he needs to only take 1/2 tablet (for 80mg  twice daily). Patient voiced understanding and thanked me for the call.   Darreld Mclean, PA-C 01/08/2019 10:12 AM

## 2019-01-24 ENCOUNTER — Other Ambulatory Visit: Payer: Self-pay | Admitting: Internal Medicine

## 2019-01-24 DIAGNOSIS — I472 Ventricular tachycardia, unspecified: Secondary | ICD-10-CM

## 2019-01-24 MED ORDER — SOTALOL HCL 80 MG PO TABS: 80.0000 mg | ORAL_TABLET | Freq: Two times a day (BID) | ORAL | 1 refills | Status: DC

## 2019-01-24 NOTE — Telephone Encounter (Signed)
Requested Prescriptions   Signed Prescriptions Disp Refills  . sotalol (BETAPACE) 80 MG tablet 180 tablet 1    Sig: Take 1 tablet (80 mg total) by mouth 2 (two) times daily.    Authorizing Provider: [name]    Ordering User: Britt Bottom

## 2019-01-24 NOTE — Telephone Encounter (Signed)
°*  STAT* If patient is at the pharmacy, call can be transferred to refill team.   1. Which medications need to be refilled? (please list name of each medication and dose if known) sotalol 80 MG   2. Which pharmacy/location (including street and city if local pharmacy) is medication to be sent to? [facility] pharmacy   3. Do they need a 30 day or 90 day supply? 90 day

## 2019-01-31 DIAGNOSIS — M1712 Unilateral primary osteoarthritis, left knee: Secondary | ICD-10-CM | POA: Diagnosis not present

## 2019-02-18 ENCOUNTER — Ambulatory Visit (INDEPENDENT_AMBULATORY_CARE_PROVIDER_SITE_OTHER): Payer: Medicare HMO | Admitting: *Deleted

## 2019-02-18 DIAGNOSIS — I5022 Chronic systolic (congestive) heart failure: Secondary | ICD-10-CM | POA: Diagnosis not present

## 2019-02-18 LAB — CUP PACEART REMOTE DEVICE CHECK
Battery Remaining Longevity: 80 mo
Battery Remaining Percentage: 80 %
Battery Voltage: 2.99 V
Brady Statistic AP VP Percent: 1 %
Brady Statistic AP VS Percent: 1 %
Brady Statistic AS VP Percent: 1 %
Brady Statistic AS VS Percent: 97 %
Brady Statistic RA Percent Paced: 1 %
Brady Statistic RV Percent Paced: 1 %
Date Time Interrogation Session: 20201211040023
HighPow Impedance: 74 Ohm
HighPow Impedance: 74 Ohm
Implantable Lead Implant Date: 20190220
Implantable Lead Implant Date: 20190220
Implantable Lead Location: 753859
Implantable Lead Location: 753860
Implantable Lead Model: 7122
Implantable Pulse Generator Implant Date: 20190220
Lead Channel Impedance Value: 350 Ohm
Lead Channel Impedance Value: 490 Ohm
Lead Channel Pacing Threshold Amplitude: 0.75 V
Lead Channel Pacing Threshold Amplitude: 0.75 V
Lead Channel Pacing Threshold Pulse Width: 0.5 ms
Lead Channel Pacing Threshold Pulse Width: 0.5 ms
Lead Channel Sensing Intrinsic Amplitude: 1.7 mV
Lead Channel Sensing Intrinsic Amplitude: 11.8 mV
Lead Channel Setting Pacing Amplitude: 2 V
Lead Channel Setting Pacing Amplitude: 2.5 V
Lead Channel Setting Pacing Pulse Width: 0.5 ms
Lead Channel Setting Sensing Sensitivity: 0.5 mV
Pulse Gen Serial Number: 9787528

## 2019-03-03 DIAGNOSIS — M1712 Unilateral primary osteoarthritis, left knee: Secondary | ICD-10-CM | POA: Diagnosis not present

## 2019-03-22 ENCOUNTER — Other Ambulatory Visit: Payer: Medicare HMO

## 2019-03-22 ENCOUNTER — Ambulatory Visit (INDEPENDENT_AMBULATORY_CARE_PROVIDER_SITE_OTHER): Payer: Medicare HMO

## 2019-03-22 ENCOUNTER — Ambulatory Visit (INDEPENDENT_AMBULATORY_CARE_PROVIDER_SITE_OTHER): Payer: Medicare HMO | Admitting: Internal Medicine

## 2019-03-22 ENCOUNTER — Encounter: Payer: Self-pay | Admitting: Internal Medicine

## 2019-03-22 ENCOUNTER — Other Ambulatory Visit: Payer: Self-pay

## 2019-03-22 VITALS — BP 114/74 | HR 77 | Ht 70.0 in | Wt 180.0 lb

## 2019-03-22 DIAGNOSIS — I77810 Thoracic aortic ectasia: Secondary | ICD-10-CM

## 2019-03-22 DIAGNOSIS — I472 Ventricular tachycardia, unspecified: Secondary | ICD-10-CM

## 2019-03-22 DIAGNOSIS — I428 Other cardiomyopathies: Secondary | ICD-10-CM | POA: Diagnosis not present

## 2019-03-22 DIAGNOSIS — I5022 Chronic systolic (congestive) heart failure: Secondary | ICD-10-CM | POA: Diagnosis not present

## 2019-03-22 DIAGNOSIS — Z79899 Other long term (current) drug therapy: Secondary | ICD-10-CM

## 2019-03-22 DIAGNOSIS — Z95810 Presence of automatic (implantable) cardiac defibrillator: Secondary | ICD-10-CM | POA: Diagnosis not present

## 2019-03-22 HISTORY — DX: Thoracic aortic ectasia: I77.810

## 2019-03-22 NOTE — Progress Notes (Signed)
Patient Care Team: [name], MD as PCP - General (Family Medicine) [name], MD as PCP - Cardiology (Cardiology) Deboraha Sprang, MD as PCP - Electrophysiology (Cardiology) Manya Silvas, MD (Inactive) (Gastroenterology)   HPI  Leroy Merritt is a 74 y.o. 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  Sotalol dose was decreased from 160--80; also started on transition to [facility] and addition of spironolactone--tolerating well   Less fatigue  The patient denies chest pain, shortness of breath, nocturnal dyspnea, orthopnea or peripheral edema.  There have been no palpitations, lightheadedness or syncope.      DATE TEST EF   2/19 cMRI   27 % Mild LGE   RVE but w/o RV anuerysm  2/19 Echo   30-35 %   2/19 LHC 25-30% No obst CAD   Date Cr GFR K Mg Hgb  2/19 1.11  4.2                Records and Results Reviewed   Past Medical History:  Diagnosis Date  . 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 BPH (benign prostatic hyperplasia)   . Coronary artery disease, non-occlusive   . Diverticulosis 2017  . Dysphagia 12/20/2014  . GERD (gastroesophageal reflux disease)   . HFrEF (heart failure with reduced ejection fraction) ([place])   . 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 Ventricular tachycardia ([place]) 04/28/2017   a.  Status post SJM dual-chamber ICD in 04/2017    Past Surgical History:  Procedure Laterality Date  . CARDIAC CATHETERIZATION  04/28/2017  . CATARACT EXTRACTION W/ INTRAOCULAR LENS IMPLANT Right 2017  . ESOPHAGOGASTRODUODENOSCOPY (EGD) WITH PROPOFOL N/A 02/19/2015   Procedure: ESOPHAGOGASTRODUODENOSCOPY (EGD) WITH PROPOFOL and esophageal dilation.;  Surgeon: Lucilla Lame, MD;  Location: [place];  Service: Endoscopy;  Laterality: N/A;  . EYE SURGERY    . ICD  IMPLANT N/A 04/29/2017   Procedure: ICD IMPLANT;  Surgeon: Evans Lance, MD;  Location: [place] CV [facility];  Service: Cardiovascular;  Laterality: N/A;  . LEFT HEART CATH AND CORONARY ANGIOGRAPHY N/A 04/28/2017   Procedure: LEFT HEART CATH AND CORONARY ANGIOGRAPHY;  Surgeon: Martinique, Peter M, MD;  Location: [place] CV LAB;  Service: Cardiovascular;  Laterality: N/A;  . PROSTATE BIOPSY  ~ 2012  . RETINAL DETACHMENT SURGERY Right 1990s   "torn retina"    Current Meds  Medication Sig  . finasteride (PROSCAR) 5 MG tablet TAKE 1 TABLET AT BEDTIME  . Multiple Vitamins-Minerals (MULTIVITAMIN ADULT PO) Take 1 tablet by mouth every morning.   [name] omeprazole (PRILOSEC) 20 MG capsule TAKE 1 CAPSULE EVERY DAY  . sacubitril-valsartan (ENTRESTO) 49-51 MG Take 1 tablet by mouth 2 (two) times daily.  . sotalol (BETAPACE) 80 MG tablet Take 1 tablet (80 mg total) by mouth 2 (two) times daily.  . tamsulosin (FLOMAX) 0.4 MG CAPS capsule TAKE 1 CAPSULE EVERY DAY    No Known Allergies    Review of Systems negative except from HPI and PMH  Physical Exam BP 114/74 (BP Location: Left Arm, Patient Position: Sitting, Cuff Size: Normal)   Pulse 77   Ht 5\' 10"  (1.778 m)   Wt 180 lb (81.6 kg)   SpO2 93%   BMI 25.83 kg/m  Well developed and well nourished in no acute distress  HENT normal Neck supple with JVP-flat Clear Device pocket well healed; without hematoma or erythema.  There is no tethering  Regular rate and rhythm, no murmur Abd-soft with active BS No Clubbing cyanosis edema Skin-warm and dry A & Oriented  Grossly normal sensory and motor function  ECG sinus @ 77 31/08/37 LAD   Assessment and  Plan  Nonischemic cardiomyopathy  Ventricular tachycardia  Implantable defibrillator-secondary prevention  St Jude   Orthostatic intolerance  High Risk Medication Surveillance sotalol., aldactone  Fatigue  First-degree AV block  HR excursion better, especially in light of limitations  2/2 knee arthritis  No intercurrent Ventricular tachycardia  Orthostasis much improved   Tolerating afterload reduction, will check BMET today  K and QT ok on sotalol, will check Mg today   We spent more than 50% of our >35 min visit in face to face counseling regarding the above          Current medicines are reviewed at length with the patient today .  The patient does not  have concerns regarding medicines.

## 2019-03-22 NOTE — Patient Instructions (Addendum)
Medication Instructions:  - Your physician recommends that you continue on your current medications as directed. Please refer to the Current Medication list given to you today.  *If you need a refill on your cardiac medications before your next appointment, please call your pharmacy*  Lab Work: - Your physician recommends that you have lab work today: BMP/ Magnesium  If you have labs (blood work) drawn today and your tests are completely normal, you will receive your results only by: [name] MyChart Message (if you have MyChart) OR . A paper copy in the mail If you have any lab test that is abnormal or we need to change your treatment, we will call you to review the results.  Testing/Procedures: - none ordered  Follow-Up: At [facility], you and your health needs are our priority.  As part of our continuing mission to provide you with exceptional heart care, we have created designated Provider Care Teams.  These Care Teams include your primary Cardiologist (physician) and Advanced Practice Providers (APPs -  Physician Assistants and Nurse Practitioners) who all work together to provide you with the care you need, when you need it.  Your next appointment:   6 month(s)  The format for your next appointment:   In Person  Provider:   Virl Axe, MD  Other Instructions n/a

## 2019-03-23 LAB — BASIC METABOLIC PANEL
BUN/Creatinine Ratio: 16 (ref 10–24)
BUN: 18 mg/dL (ref 8–27)
CO2: 24 mmol/L (ref 20–29)
Calcium: 9.8 mg/dL (ref 8.6–10.2)
Chloride: 106 mmol/L (ref 96–106)
Creatinine, Ser: 1.12 mg/dL (ref 0.76–1.27)
GFR calc Af Amer: 74 mL/min/{1.73_m2} (ref >59)
GFR calc non Af Amer: 64 mL/min/{1.73_m2} (ref >59)
Glucose: 98 mg/dL (ref 65–99)
Potassium: 4.7 mmol/L (ref 3.5–5.2)
Sodium: 141 mmol/L (ref 134–144)

## 2019-03-23 LAB — MAGNESIUM: Magnesium: 2.3 mg/dL (ref 1.6–2.3)

## 2019-03-24 DIAGNOSIS — S93401A Sprain of unspecified ligament of right ankle, initial encounter: Secondary | ICD-10-CM | POA: Diagnosis not present

## 2019-03-24 DIAGNOSIS — M1712 Unilateral primary osteoarthritis, left knee: Secondary | ICD-10-CM | POA: Diagnosis not present

## 2019-03-26 NOTE — Progress Notes (Signed)
ICD remote 

## 2019-03-28 DIAGNOSIS — H524 Presbyopia: Secondary | ICD-10-CM | POA: Diagnosis not present

## 2019-04-11 DIAGNOSIS — H26491 Other secondary cataract, right eye: Secondary | ICD-10-CM | POA: Diagnosis not present

## 2019-04-11 DIAGNOSIS — H2512 Age-related nuclear cataract, left eye: Secondary | ICD-10-CM | POA: Diagnosis not present

## 2019-05-11 DIAGNOSIS — H2512 Age-related nuclear cataract, left eye: Secondary | ICD-10-CM | POA: Diagnosis not present

## 2019-05-12 ENCOUNTER — Encounter: Payer: Self-pay | Admitting: Ophthalmology

## 2019-05-18 ENCOUNTER — Other Ambulatory Visit: Payer: Self-pay

## 2019-05-18 ENCOUNTER — Other Ambulatory Visit
Admission: RE | Admit: 2019-05-18 | Discharge: 2019-05-18 | Disposition: A | Discharge status: Home / Self Care | Payer: Medicare HMO | Source: Ambulatory Visit | Attending: Ophthalmology | Admitting: Ophthalmology

## 2019-05-18 DIAGNOSIS — Z20822 Contact with and (suspected) exposure to covid-19: Secondary | ICD-10-CM | POA: Insufficient documentation

## 2019-05-18 DIAGNOSIS — Z01812 Encounter for preprocedural laboratory examination: Secondary | ICD-10-CM | POA: Diagnosis not present

## 2019-05-18 LAB — SARS CORONAVIRUS 2 (TAT 6-24 HRS): SARS Coronavirus 2: NEGATIVE

## 2019-05-19 NOTE — Progress Notes (Signed)
No pacemaker form necessary per dr Lubertha Basque; no cautery used during cataract surgery.

## 2019-05-20 ENCOUNTER — Ambulatory Visit: Payer: Medicare HMO | Admitting: Registered Nurse

## 2019-05-20 ENCOUNTER — Encounter: Payer: Self-pay | Admitting: Ophthalmology

## 2019-05-20 ENCOUNTER — Encounter
Admission: RE | Disposition: A | Discharge status: Home / Self Care | Payer: Self-pay | Source: Home / Self Care | Attending: Ophthalmology

## 2019-05-20 ENCOUNTER — Other Ambulatory Visit: Payer: Self-pay

## 2019-05-20 ENCOUNTER — Ambulatory Visit
Admission: RE | Admit: 2019-05-20 | Discharge: 2019-05-20 | Disposition: A | Discharge status: Home / Self Care | Payer: Medicare HMO | Attending: Ophthalmology | Admitting: Ophthalmology

## 2019-05-20 ENCOUNTER — Ambulatory Visit (INDEPENDENT_AMBULATORY_CARE_PROVIDER_SITE_OTHER): Payer: Medicare HMO | Admitting: *Deleted

## 2019-05-20 DIAGNOSIS — Z95810 Presence of automatic (implantable) cardiac defibrillator: Secondary | ICD-10-CM | POA: Insufficient documentation

## 2019-05-20 DIAGNOSIS — Z79899 Other long term (current) drug therapy: Secondary | ICD-10-CM | POA: Diagnosis not present

## 2019-05-20 DIAGNOSIS — H2512 Age-related nuclear cataract, left eye: Secondary | ICD-10-CM | POA: Diagnosis not present

## 2019-05-20 DIAGNOSIS — I5020 Unspecified systolic (congestive) heart failure: Secondary | ICD-10-CM | POA: Insufficient documentation

## 2019-05-20 DIAGNOSIS — E7800 Pure hypercholesterolemia, unspecified: Secondary | ICD-10-CM | POA: Diagnosis not present

## 2019-05-20 DIAGNOSIS — I5022 Chronic systolic (congestive) heart failure: Secondary | ICD-10-CM | POA: Diagnosis not present

## 2019-05-20 DIAGNOSIS — K219 Gastro-esophageal reflux disease without esophagitis: Secondary | ICD-10-CM | POA: Diagnosis not present

## 2019-05-20 DIAGNOSIS — N400 Enlarged prostate without lower urinary tract symptoms: Secondary | ICD-10-CM | POA: Insufficient documentation

## 2019-05-20 DIAGNOSIS — I2510 Atherosclerotic heart disease of native coronary artery without angina pectoris: Secondary | ICD-10-CM | POA: Insufficient documentation

## 2019-05-20 DIAGNOSIS — K2100 Gastro-esophageal reflux disease with esophagitis, without bleeding: Secondary | ICD-10-CM | POA: Diagnosis not present

## 2019-05-20 HISTORY — DX: Pure hypercholesterolemia, unspecified: E78.00

## 2019-05-20 HISTORY — PX: CATARACT EXTRACTION W/PHACO: SHX586

## 2019-05-20 HISTORY — DX: Unspecified osteoarthritis, unspecified site: M19.90

## 2019-05-20 SURGERY — PHACOEMULSIFICATION, CATARACT, WITH IOL INSERTION
Anesthesia: Monitor Anesthesia Care | Site: Eye | Laterality: Left

## 2019-05-20 MED ORDER — ONDANSETRON HCL 4 MG/2ML IJ SOLN: 4.0000 mg | Freq: Once | INTRAMUSCULAR | Status: DC | PRN

## 2019-05-20 MED ORDER — MOXIFLOXACIN HCL 0.5 % OP SOLN
OPHTHALMIC | Status: AC
  Filled 2019-05-20: qty 3

## 2019-05-20 MED ORDER — MOXIFLOXACIN HCL 0.5 % OP SOLN
1.0000 [drp] | Freq: Once | OPHTHALMIC | Status: AC
  Administered 2019-05-20: 1 [drp] via OPHTHALMIC

## 2019-05-20 MED ORDER — POVIDONE-IODINE 5 % OP SOLN
OPHTHALMIC | Status: DC | PRN
  Administered 2019-05-20: 1 via OPHTHALMIC

## 2019-05-20 MED ORDER — TETRACAINE HCL 0.5 % OP SOLN
1.0000 [drp] | OPHTHALMIC | Status: AC | PRN
  Administered 2019-05-20 (×2): 1 [drp] via OPHTHALMIC

## 2019-05-20 MED ORDER — ARMC OPHTHALMIC DILATING DROPS
1.0000 "application " | OPHTHALMIC | Status: AC
  Administered 2019-05-20 (×3): 1 via OPHTHALMIC

## 2019-05-20 MED ORDER — FENTANYL CITRATE (PF) 100 MCG/2ML IJ SOLN
INTRAMUSCULAR | Status: DC | PRN
  Administered 2019-05-20: 50 ug via INTRAVENOUS

## 2019-05-20 MED ORDER — FENTANYL CITRATE (PF) 100 MCG/2ML IJ SOLN
INTRAMUSCULAR | Status: AC
  Filled 2019-05-20: qty 2

## 2019-05-20 MED ORDER — MIDAZOLAM HCL 2 MG/2ML IJ SOLN
INTRAMUSCULAR | Status: AC
  Filled 2019-05-20: qty 2

## 2019-05-20 MED ORDER — EPINEPHRINE PF 1 MG/ML IJ SOLN: INTRAOCULAR | Status: DC | PRN

## 2019-05-20 MED ORDER — ARMC OPHTHALMIC DILATING DROPS
OPHTHALMIC | Status: AC
  Filled 2019-05-20: qty 0.5

## 2019-05-20 MED ORDER — TETRACAINE HCL 0.5 % OP SOLN
OPHTHALMIC | Status: AC
  Filled 2019-05-20: qty 4

## 2019-05-20 MED ORDER — NA CHONDROIT SULF-NA HYALURON 40-17 MG/ML IO SOLN
INTRAOCULAR | Status: DC | PRN
  Administered 2019-05-20: 1 mL via INTRAOCULAR

## 2019-05-20 MED ORDER — CARBACHOL 0.01 % IO SOLN
INTRAOCULAR | Status: DC | PRN
  Administered 2019-05-20: 0.5 mL via INTRAOCULAR

## 2019-05-20 MED ORDER — MIDAZOLAM HCL 2 MG/2ML IJ SOLN
INTRAMUSCULAR | Status: DC | PRN
  Administered 2019-05-20: 1 mg via INTRAVENOUS

## 2019-05-20 MED ORDER — SODIUM CHLORIDE 0.9 % IV SOLN: INTRAVENOUS | Status: DC

## 2019-05-20 MED ORDER — LIDOCAINE HCL (PF) 4 % IJ SOLN
INTRAOCULAR | Status: DC | PRN
  Administered 2019-05-20: 4 mL via OPHTHALMIC

## 2019-05-20 SURGICAL SUPPLY — 16 items
GLOVE BIO SURGEON STRL SZ8 (GLOVE) ×3 IMPLANT
GLOVE BIOGEL M 6.5 STRL (GLOVE) ×3 IMPLANT
GLOVE SURG LX 8.0 MICRO (GLOVE) ×2
GLOVE SURG LX STRL 8.0 MICRO (GLOVE) ×1 IMPLANT
GOWN STRL REUS W/ TWL LRG LVL3 (GOWN DISPOSABLE) ×2 IMPLANT
GOWN STRL REUS W/TWL LRG LVL3 (GOWN DISPOSABLE) ×4
LABEL CATARACT MEDS ST (LABEL) ×3 IMPLANT
LENS IOL TECNIS ITEC 17.0 (Intraocular Lens) ×2 IMPLANT
PACK CATARACT (MISCELLANEOUS) ×3 IMPLANT
PACK CATARACT BRASINGTON LX (MISCELLANEOUS) ×3 IMPLANT
PACK EYE AFTER SURG (MISCELLANEOUS) ×3 IMPLANT
SOL BSS BAG (MISCELLANEOUS) ×3
SOLUTION BSS BAG (MISCELLANEOUS) ×1 IMPLANT
SYR 5ML LL (SYRINGE) ×3 IMPLANT
WATER STERILE IRR 250ML POUR (IV SOLUTION) ×3 IMPLANT
WIPE NON LINTING 3.25X3.25 (MISCELLANEOUS) ×3 IMPLANT

## 2019-05-20 NOTE — H&P (Signed)
All labs reviewed. Abnormal studies sent to patients PCP when indicated.  Previous H&P reviewed, patient examined, there are NO CHANGES.  Juli Odom Porfilio3/12/20217:18 AM

## 2019-05-20 NOTE — Op Note (Signed)
PREOPERATIVE DIAGNOSIS:  Nuclear sclerotic cataract of the left eye.   POSTOPERATIVE DIAGNOSIS:  Nuclear sclerotic cataract of the left eye.   OPERATIVE PROCEDURE: Procedure(s): CATARACT EXTRACTION PHACO AND INTRAOCULAR LENS PLACEMENT ([place]) LEFT   SURGEON:  Birder Robson, MD.   ANESTHESIA:  Anesthesiologist: Arita Miss, MD CRNA: Hedda Slade, CRNA  1.      Managed anesthesia care. 2.     0.56ml of Shugarcaine was instilled following the paracentesis   COMPLICATIONS:  None.   TECHNIQUE:   Stop and chop   DESCRIPTION OF PROCEDURE:  The patient was examined and consented in the preoperative holding area where the aforementioned topical anesthesia was applied to the left eye and then brought back to the Operating Room where the left eye was prepped and draped in the usual sterile ophthalmic fashion and a lid speculum was placed. A paracentesis was created with the side port blade and the anterior chamber was filled with viscoelastic. A near clear corneal incision was performed with the steel keratome. A continuous curvilinear capsulorrhexis was performed with a cystotome followed by the capsulorrhexis forceps. Hydrodissection and hydrodelineation were carried out with BSS on a blunt cannula. The lens was removed in a stop and chop  technique and the remaining cortical material was removed with the irrigation-aspiration handpiece. The capsular bag was inflated with viscoelastic and the Technis [id number] lens was placed in the capsular bag without complication. The remaining viscoelastic was removed from the eye with the irrigation-aspiration handpiece. The wounds were hydrated. The anterior chamber was flushed with Miostat and the eye was inflated to physiologic pressure. 0.50ml Vigamox was placed in the anterior chamber. The wounds were found to be water tight. The eye was dressed with Vigamox. The patient was given protective glasses to wear throughout the day and a shield with which to sleep  tonight. The patient was also given drops with which to begin a drop regimen today and will follow-up with me in one day. Implant Name Type Inv. Item Serial No. Manufacturer Lot No. LRB No. Used Action  LENS IOL DIOP 17.0 - [id number] Intraocular Lens LENS IOL DIOP 17.0 [id number]  Left 1 Implanted    Procedure(s) with comments: CATARACT EXTRACTION PHACO AND INTRAOCULAR LENS PLACEMENT ([facility]) LEFT (Left) - [place] 01:19.7 CDE 14.58 Fluid Pack Lot # [id number]  Electronically signed: Birder Robson 05/20/2019 9:29 AM

## 2019-05-20 NOTE — Discharge Instructions (Addendum)
Eye Surgery Discharge Instructions  Expect mild scratchy sensation or mild soreness. DO NOT RUB YOUR EYE!  The day of surgery: . Minimal physical activity, but bed rest is not required . No reading, computer work, or close hand work . No bending, lifting, or straining. . May watch TV  For 24 hours: . No driving, legal decisions, or alcoholic beverages . Safety precautions . Eat anything you prefer: It is better to start with liquids, then soup then solid foods. . Solar shield eyeglasses should be worn for comfort in the sunlight/patch while sleeping  Resume all regular medications including aspirin or Coumadin if these were discontinued prior to surgery. You may shower, bathe, shave, or wash your hair. Tylenol may be taken for mild discomfort. Follow eye drop instruction sheet as reviewed.  Call your doctor if you experience significant pain, nausea, or vomiting, fever > 101 or other signs of infection. [phone] or [phone] Specific instructions:  Follow-up Information    Birder Robson, MD Follow up.   Specialty: Ophthalmology Why: 05/20/19 @ 2:00 pm Contact information: [address] [place] [address] [phone]

## 2019-05-20 NOTE — Transfer of Care (Signed)
Immediate Anesthesia Transfer of Care Note  Patient: Leroy Merritt  Procedure(s) Performed: CATARACT EXTRACTION PHACO AND INTRAOCULAR LENS PLACEMENT ([facility]) LEFT (Left Eye)  Patient Location: PACU  Anesthesia Type:MAC  Level of Consciousness: awake, alert  and oriented  Airway & Oxygen Therapy: Patient Spontanous Breathing  Post-op Assessment: Report given to RN and Post -op Vital signs reviewed and stable  Post vital signs: Reviewed and stable  Last Vitals:  Vitals Value Taken Time  BP    Temp    Pulse    Resp    SpO2      Last Pain:  Vitals:   05/20/19 0732  TempSrc: Tympanic  PainSc: 0-No pain         Complications: No apparent anesthesia complications

## 2019-05-20 NOTE — Anesthesia Postprocedure Evaluation (Signed)
Anesthesia Post Note  Patient: Leroy Merritt  Procedure(s) Performed: CATARACT EXTRACTION PHACO AND INTRAOCULAR LENS PLACEMENT ([place]) LEFT (Left Eye)  Patient location during evaluation: Phase II Anesthesia Type: MAC Level of consciousness: awake and alert Pain management: pain level controlled Vital Signs Assessment: post-procedure vital signs reviewed and stable Respiratory status: spontaneous breathing, nonlabored ventilation, respiratory function stable and patient connected to nasal cannula oxygen Cardiovascular status: blood pressure returned to baseline and stable Postop Assessment: no apparent nausea or vomiting Anesthetic complications: no     Last Vitals:  Vitals:   05/20/19 0732 05/20/19 0831  BP: 129/79 126/77  Pulse: 68 62  Resp: 14 16  Temp: (!) 36.3 C (!) 36.3 C  SpO2: 98% 100%    Last Pain:  Vitals:   05/20/19 0831  TempSrc: Temporal  PainSc: 0-No pain                 Arita Miss

## 2019-05-20 NOTE — Anesthesia Preprocedure Evaluation (Signed)
Anesthesia Evaluation  Patient identified by MRN, date of birth, ID band Patient awake    Reviewed: Allergy & Precautions, NPO status , Patient's Chart, lab work & pertinent test results  History of Anesthesia Complications Negative for: history of anesthetic complications  Airway Mallampati: II  TM Distance: >3 FB Neck ROM: Full    Dental no notable dental hx. (+) Teeth Intact   Pulmonary neg pulmonary ROS, neg sleep apnea, neg COPD, Patient abstained from smoking.Not current smoker,    Pulmonary exam normal breath sounds clear to auscultation       Cardiovascular Exercise Tolerance: Good METS(-) hypertension+ CAD  (-) Past MI + dysrhythmias + Cardiac Defibrillator  Rhythm:Regular Rate:Normal - Systolic murmurs    Neuro/Psych negative neurological ROS  negative psych ROS   GI/Hepatic GERD  ,(+)     (-) substance abuse  ,   Endo/Other  neg diabetes  Renal/GU negative Renal ROS     Musculoskeletal   Abdominal   Peds  Hematology   Anesthesia Other Findings Past Medical History: No date: Arthritis 04/28/2017: AV dissociation     Comment:  Archie Endo 04/28/2017 No date: BPH (benign prostatic hyperplasia) No date: Coronary artery disease, non-occlusive 2017: Diverticulosis 12/20/2014: Dysphagia No date: GERD (gastroesophageal reflux disease) No date: HFrEF (heart failure with reduced ejection fraction) ([place]) No date: Hypercholesterolemia 12/20/2014: Varicose veins of both lower extremities 04/28/2017: Ventricular tachycardia ([place])     Comment:  a.  Status post SJM dual-chamber ICD in 04/2017  Reproductive/Obstetrics                             Anesthesia Physical Anesthesia Plan  ASA: III  Anesthesia Plan: MAC   Post-op Pain Management:    Induction: Intravenous  PONV Risk Score and Plan: 2 and Midazolam  Airway Management Planned: Nasal Cannula  Additional Equipment:    Intra-op Plan:   Post-operative Plan:   Informed Consent: I have reviewed the patients History and Physical, chart, labs and discussed the procedure including the risks, benefits and alternatives for the proposed anesthesia with the patient or authorized representative who has indicated his/her understanding and acceptance.       Plan Discussed with: CRNA and Surgeon  Anesthesia Plan Comments: (Explained risks of anesthesia, including PONV, and rare emergencies requiring invasive intervention. Patient understands.  No need for ICD intervention, as there is no electrical interference)        Anesthesia Quick Evaluation

## 2019-05-21 LAB — CUP PACEART REMOTE DEVICE CHECK
Battery Remaining Longevity: 78 mo
Battery Remaining Percentage: 77 %
Battery Voltage: 2.99 V
Brady Statistic AP VP Percent: 1 %
Brady Statistic AP VS Percent: 1 %
Brady Statistic AS VP Percent: 1 %
Brady Statistic AS VS Percent: 97 %
Brady Statistic RA Percent Paced: 1 %
Brady Statistic RV Percent Paced: 1 %
Date Time Interrogation Session: 20210312040016
HighPow Impedance: 81 Ohm
HighPow Impedance: 81 Ohm
Implantable Lead Implant Date: 20190220
Implantable Lead Implant Date: 20190220
Implantable Lead Location: 753859
Implantable Lead Location: 753860
Implantable Lead Model: 7122
Implantable Pulse Generator Implant Date: 20190220
Lead Channel Impedance Value: 390 Ohm
Lead Channel Impedance Value: 490 Ohm
Lead Channel Pacing Threshold Amplitude: 0.75 V
Lead Channel Pacing Threshold Amplitude: 1 V
Lead Channel Pacing Threshold Pulse Width: 0.5 ms
Lead Channel Pacing Threshold Pulse Width: 0.5 ms
Lead Channel Sensing Intrinsic Amplitude: 1.8 mV
Lead Channel Sensing Intrinsic Amplitude: 11.8 mV
Lead Channel Setting Pacing Amplitude: 2 V
Lead Channel Setting Pacing Amplitude: 2.5 V
Lead Channel Setting Pacing Pulse Width: 0.5 ms
Lead Channel Setting Sensing Sensitivity: 0.5 mV
Pulse Gen Serial Number: 9787528

## 2019-06-23 ENCOUNTER — Other Ambulatory Visit: Payer: Self-pay | Admitting: Cardiovascular Disease

## 2019-06-23 DIAGNOSIS — I472 Ventricular tachycardia, unspecified: Secondary | ICD-10-CM

## 2019-08-19 ENCOUNTER — Ambulatory Visit (INDEPENDENT_AMBULATORY_CARE_PROVIDER_SITE_OTHER): Payer: Medicare HMO | Admitting: *Deleted

## 2019-08-19 DIAGNOSIS — I472 Ventricular tachycardia, unspecified: Secondary | ICD-10-CM

## 2019-08-19 DIAGNOSIS — I5022 Chronic systolic (congestive) heart failure: Secondary | ICD-10-CM

## 2019-08-19 LAB — CUP PACEART REMOTE DEVICE CHECK
Battery Remaining Longevity: 76 mo
Battery Remaining Percentage: 76 %
Battery Voltage: 2.99 V
Brady Statistic AP VP Percent: 1 %
Brady Statistic AP VS Percent: 1 %
Brady Statistic AS VP Percent: 1 %
Brady Statistic AS VS Percent: 97 %
Brady Statistic RA Percent Paced: 1 %
Brady Statistic RV Percent Paced: 1 %
Date Time Interrogation Session: 20210611040016
HighPow Impedance: 79 Ohm
HighPow Impedance: 79 Ohm
Implantable Lead Implant Date: 20190220
Implantable Lead Implant Date: 20190220
Implantable Lead Location: 753859
Implantable Lead Location: 753860
Implantable Lead Model: 7122
Implantable Pulse Generator Implant Date: 20190220
Lead Channel Impedance Value: 380 Ohm
Lead Channel Impedance Value: 460 Ohm
Lead Channel Pacing Threshold Amplitude: 0.75 V
Lead Channel Pacing Threshold Amplitude: 1 V
Lead Channel Pacing Threshold Pulse Width: 0.5 ms
Lead Channel Pacing Threshold Pulse Width: 0.5 ms
Lead Channel Sensing Intrinsic Amplitude: 1.1 mV
Lead Channel Sensing Intrinsic Amplitude: 11.8 mV
Lead Channel Setting Pacing Amplitude: 2 V
Lead Channel Setting Pacing Amplitude: 2.5 V
Lead Channel Setting Pacing Pulse Width: 0.5 ms
Lead Channel Setting Sensing Sensitivity: 0.5 mV
Pulse Gen Serial Number: 9787528

## 2019-08-19 NOTE — Progress Notes (Signed)
Remote ICD transmission.   

## 2019-08-22 ENCOUNTER — Ambulatory Visit (INDEPENDENT_AMBULATORY_CARE_PROVIDER_SITE_OTHER): Payer: Medicare HMO

## 2019-08-22 VITALS — Ht 71.0 in | Wt 180.0 lb

## 2019-08-22 DIAGNOSIS — Z0000 Encounter for general adult medical examination without abnormal findings: Secondary | ICD-10-CM | POA: Diagnosis not present

## 2019-08-22 NOTE — Patient Instructions (Signed)
Leroy Merritt , Thank you for taking time to come for your Medicare Wellness Visit. I appreciate your ongoing commitment to your health goals. Please review the following plan we discussed and let me know if I can assist you in the future.   Screening recommendations/referrals: Colonoscopy: no longer required  Recommended yearly ophthalmology/optometry visit for glaucoma screening and checkup Recommended yearly dental visit for hygiene and checkup  Vaccinations: Influenza vaccine: due 11/2019 Pneumococcal vaccine: booster due now Tdap vaccine: up to date  Shingles vaccine: shingrix eligible, check with pharmacy    Covid-19: completed   Advanced directives: Please bring a copy of your health care power of attorney and living will to the office at your convenience.  Conditions/risks identified: Free hearing clinics offered in [place]:   [address] [place], [place], [place] [address] [phone]  [facility], [place], [place] [address] [phone]  Next appointment: Follow up in one year for your annual wellness visit.    Preventive Care 3 Years and Older, Male Preventive care refers to lifestyle choices and visits with your health care provider that can promote health and wellness. What does preventive care include?  A yearly physical exam. This is also called an annual well check.  Dental exams once or twice a year.  Routine eye exams. Ask your health care provider how often you should have your eyes checked.  Personal lifestyle choices, including:  Daily care of your teeth and gums.  Regular physical activity.  Eating a healthy diet.  Avoiding tobacco and drug use.  Limiting alcohol use.  Practicing safe sex.  Taking low doses of aspirin every day.  Taking vitamin and mineral supplements as recommended by your health care provider. What happens during an annual well check? The services and screenings done  by your health care provider during your annual well check will depend on your age, overall health, lifestyle risk factors, and family history of disease. Counseling  Your health care provider may ask you questions about your:  Alcohol use.  Tobacco use.  Drug use.  Emotional well-being.  Home and relationship well-being.  Sexual activity.  Eating habits.  History of falls.  Memory and ability to understand (cognition).  Work and work Statistician. Screening  You may have the following tests or measurements:  Height, weight, and BMI.  Blood pressure.  Lipid and cholesterol levels. These may be checked every 5 years, or more frequently if you are over 6 years old.  Skin check.  Lung cancer screening. You may have this screening every year starting at age 58 if you have a 30-pack-year history of smoking and currently smoke or have quit within the past 15 years.  Fecal occult blood test (FOBT) of the stool. You may have this test every year starting at age 1.  Flexible sigmoidoscopy or colonoscopy. You may have a sigmoidoscopy every 5 years or a colonoscopy every 10 years starting at age 27.  Prostate cancer screening. Recommendations will vary depending on your family history and other risks.  Hepatitis C blood test.  Hepatitis B blood test.  Sexually transmitted disease (STD) testing.  Diabetes screening. This is done by checking your blood sugar (glucose) after you have not eaten for a while (fasting). You may have this done every 1-3 years.  Abdominal aortic aneurysm (AAA) screening. You may need this if you are a current or former smoker.  Osteoporosis. You may be screened starting at age 63  if you are at high risk. Talk with your health care provider about your test results, treatment options, and if necessary, the need for more tests. Vaccines  Your health care provider may recommend certain vaccines, such as:  Influenza vaccine. This is recommended every  year.  Tetanus, diphtheria, and acellular pertussis (Tdap, Td) vaccine. You may need a Td booster every 10 years.  Zoster vaccine. You may need this after age 57.  Pneumococcal 13-valent conjugate (PCV13) vaccine. One dose is recommended after age 52.  Pneumococcal polysaccharide (PPSV23) vaccine. One dose is recommended after age 45. Talk to your health care provider about which screenings and vaccines you need and how often you need them. This information is not intended to replace advice given to you by your health care provider. Make sure you discuss any questions you have with your health care provider. Document Released: 03/23/2015 Document Revised: 11/14/2015 Document Reviewed: 12/26/2014 Elsevier Interactive Patient Education  2017 [place] Prevention in the Home Falls can cause injuries. They can happen to people of all ages. There are many things you can do to make your home safe and to help prevent falls. What can I do on the outside of my home?  Regularly fix the edges of walkways and driveways and fix any cracks.  Remove anything that might make you trip as you walk through a door, such as a raised step or threshold.  Trim any bushes or trees on the path to your home.  Use bright outdoor lighting.  Clear any walking paths of anything that might make someone trip, such as rocks or tools.  Regularly check to see if handrails are loose or broken. Make sure that both sides of any steps have handrails.  Any raised decks and porches should have guardrails on the edges.  Have any leaves, snow, or ice cleared regularly.  Use sand or salt on walking paths during winter.  Clean up any spills in your garage right away. This includes oil or grease spills. What can I do in the bathroom?  Use night lights.  Install grab bars by the toilet and in the tub and shower. Do not use towel bars as grab bars.  Use non-skid mats or decals in the tub or shower.  If you  need to sit down in the shower, use a plastic, non-slip stool.  Keep the floor dry. Clean up any water that spills on the floor as soon as it happens.  Remove soap buildup in the tub or shower regularly.  Attach bath mats securely with double-sided non-slip rug tape.  Do not have throw rugs and other things on the floor that can make you trip. What can I do in the bedroom?  Use night lights.  Make sure that you have a light by your bed that is easy to reach.  Do not use any sheets or blankets that are too big for your bed. They should not hang down onto the floor.  Have a firm chair that has side arms. You can use this for support while you get dressed.  Do not have throw rugs and other things on the floor that can make you trip. What can I do in the kitchen?  Clean up any spills right away.  Avoid walking on wet floors.  Keep items that you use a lot in easy-to-reach places.  If you need to reach something above you, use a strong step stool that has a grab bar.  Keep electrical  cords out of the way.  Do not use floor polish or wax that makes floors slippery. If you must use wax, use non-skid floor wax.  Do not have throw rugs and other things on the floor that can make you trip. What can I do with my stairs?  Do not leave any items on the stairs.  Make sure that there are handrails on both sides of the stairs and use them. Fix handrails that are broken or loose. Make sure that handrails are as long as the stairways.  Check any carpeting to make sure that it is firmly attached to the stairs. Fix any carpet that is loose or worn.  Avoid having throw rugs at the top or bottom of the stairs. If you do have throw rugs, attach them to the floor with carpet tape.  Make sure that you have a light switch at the top of the stairs and the bottom of the stairs. If you do not have them, ask someone to add them for you. What else can I do to help prevent falls?  Wear shoes  that:  Do not have high heels.  Have rubber bottoms.  Are comfortable and fit you well.  Are closed at the toe. Do not wear sandals.  If you use a stepladder:  Make sure that it is fully opened. Do not climb a closed stepladder.  Make sure that both sides of the stepladder are locked into place.  Ask someone to hold it for you, if possible.  Clearly mark and make sure that you can see:  Any grab bars or handrails.  First and last steps.  Where the edge of each step is.  Use tools that help you move around (mobility aids) if they are needed. These include:  Canes.  Walkers.  Scooters.  Crutches.  Turn on the lights when you go into a dark area. Replace any light bulbs as soon as they burn out.  Set up your furniture so you have a clear path. Avoid moving your furniture around.  If any of your floors are uneven, fix them.  If there are any pets around you, be aware of where they are.  Review your medicines with your doctor. Some medicines can make you feel dizzy. This can increase your chance of falling. Ask your doctor what other things that you can do to help prevent falls. This information is not intended to replace advice given to you by your health care provider. Make sure you discuss any questions you have with your health care provider. Document Released: 12/21/2008 Document Revised: 08/02/2015 Document Reviewed: 03/31/2014 Elsevier Interactive Patient Education  2017 [employer].

## 2019-08-22 NOTE — Progress Notes (Signed)
Subjective:   Leroy Merritt is a 74 y.o. male who presents for Medicare Annual/Subsequent preventive examination.  I connected with Leroy Merritt  today by telephone and verified that I am speaking with the correct person using two identifiers. Location patient: home Location provider: work Persons participating in the virtual visit: patient, Marine scientist.    I discussed the limitations, risks, security and privacy concerns of performing an evaluation and management service by telephone and the availability of in person appointments. I also discussed with the patient that there may be a patient responsible charge related to this service. The patient expressed understanding and verbally consented to this telephonic visit.    Interactive audio and video telecommunications were attempted between this provider and patient, however failed, due to patient having technical difficulties OR patient did not have access to video capability.  We continued and completed visit with audio only.  Some vital signs may be absent or patient reported.   Time Spent with patient on telephone encounter: 20 minutes  Review of Systems:  Cardiac Risk Factors include: advanced age (>91men, >24 women);dyslipidemia;male gender     Objective:    Vitals: Ht 5\' 11"  (1.803 m)   Wt 180 lb (81.6 kg)   BMI 25.10 kg/m   Body mass index is 25.1 kg/m.  Advanced Directives 08/22/2019 04/28/2017 03/16/2017 08/05/2015 02/19/2015  Does Patient Have a Medical Advance Directive? No No Yes No No  Type of Advance Directive - Public librarian;Living will - -  Copy of [place] in Chart? - - No - copy requested - -  Would patient like information on creating a medical advance directive? Yes (MAU/Ambulatory/Procedural Areas - Information given) No - Patient declined - No - patient declined information Yes Higher education careers adviser given    Tobacco Social History   Tobacco Use  Smoking Status Never Smoker   Smokeless Tobacco Never Used     Counseling given: Not Answered   Clinical Intake:  Pre-visit preparation completed: Yes  Pain : No/denies pain     Nutritional Status: BMI 25 -29 Overweight Nutritional Risks: None Diabetes: No  How often do you need to have someone help you when you read instructions, pamphlets, or other written materials from your doctor or pharmacy?: 1 - Never  Interpreter Needed?: No  Information entered by :: Jubal Rademaker,LPN  Past Medical History:  Diagnosis Date  . Arthritis   . 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 BPH (benign prostatic hyperplasia)   . Cataract   . Coronary artery disease, non-occlusive   . Diverticulosis 2017  . Dysphagia 12/20/2014  . GERD (gastroesophageal reflux disease)   . HFrEF (heart failure with reduced ejection fraction) ([place])   . Hypercholesterolemia   . 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 Ventricular tachycardia ([place]) 04/28/2017   a.  Status post SJM dual-chamber ICD in 04/2017   Past Surgical History:  Procedure Laterality Date  . CARDIAC CATHETERIZATION  04/28/2017  . CATARACT EXTRACTION W/ INTRAOCULAR LENS IMPLANT Right 2017  . CATARACT EXTRACTION W/PHACO Left 05/20/2019   Procedure: CATARACT EXTRACTION PHACO AND INTRAOCULAR LENS PLACEMENT ([place]) LEFT;  Surgeon: Birder Robson, MD;  Location: [facility];  Service: Ophthalmology;  Laterality: Left;  [place] 01:19.7 CDE 14.58 Fluid Pack Lot # [id number]  . ESOPHAGOGASTRODUODENOSCOPY (EGD) WITH PROPOFOL N/A 02/19/2015   Procedure: ESOPHAGOGASTRODUODENOSCOPY (EGD) WITH PROPOFOL and esophageal dilation.;  Surgeon: Lucilla Lame, MD;  Location: [facility];  Service: Endoscopy;  Laterality:  N/A;  . EYE SURGERY    . ICD IMPLANT N/A 04/29/2017   Procedure: ICD IMPLANT;  Surgeon: Evans Lance, MD;  Location: [facility];  Service: Cardiovascular;  Laterality: N/A;  . LEFT HEART CATH AND CORONARY ANGIOGRAPHY N/A 04/28/2017    Procedure: LEFT HEART CATH AND CORONARY ANGIOGRAPHY;  Surgeon: Martinique, Peter M, MD;  Location: [facility];  Service: Cardiovascular;  Laterality: N/A;  . PROSTATE BIOPSY  ~ 2012  . RETINAL DETACHMENT SURGERY Right 1990s   "torn retina"   Family History  Problem Relation Age of Onset  . Arthritis Mother   . Diabetes Father   . Hypertension Father   . Heart disease Brother   . Hyperlipidemia Brother   . Hypertension Brother   . Lung cancer Daughter   . Cancer Daughter    Social History   Socioeconomic History  . Marital status: Married    Spouse name: Not on file  . Number of children: Not on file  . Years of education: college  . Highest education level: Not on file  Occupational History  . Not on file  Tobacco Use  . Smoking status: Never Smoker  . Smokeless tobacco: Never Used  Vaping Use  . Vaping Use: Never used  Substance and Sexual Activity  . Alcohol use: Yes    Alcohol/week: 1.0 standard drink    Types: 1 Glasses of wine per week    Comment: 1 Drink/Week  . Drug use: No  . Sexual activity: Yes    Birth control/protection: None    Comment: NA  Other Topics Concern  . Not on file  Social History Narrative  . Not on file   Social Determinants of Health   Financial Resource Strain:   . Difficulty of Paying Living Expenses:   Food Insecurity:   . Worried About Charity fundraiser in the Last Year:   . Arboriculturist in the Last Year:   Transportation Needs:   . Film/video editor (Medical):   [name] Lack of Transportation (Non-Medical):   Physical Activity:   . Days of Exercise per Week:   . Minutes of Exercise per Session:   Stress:   . Feeling of Stress :   Social Connections:   . Frequency of Communication with Friends and Family:   . Frequency of Social Gatherings with Friends and Family:   . Attends Religious Services:   . Active Member of Clubs or Organizations:   . Attends Archivist Meetings:   [name] Marital Status:      Outpatient Encounter Medications as of 08/22/2019  Medication Sig  . finasteride (PROSCAR) 5 MG tablet TAKE 1 TABLET AT BEDTIME  . Multiple Vitamins-Minerals (MULTIVITAMIN ADULT PO) Take 1 tablet by mouth every morning.   [name] omeprazole (PRILOSEC) 20 MG capsule TAKE 1 CAPSULE EVERY DAY  . sacubitril-valsartan (ENTRESTO) 49-51 MG Take 1 tablet by mouth 2 (two) times daily.  . sotalol (BETAPACE) 80 MG tablet TAKE 1 TABLET TWICE DAILY  . spironolactone (ALDACTONE) 25 MG tablet Take 0.5 tablets (12.5 mg total) by mouth daily.  . tamsulosin (FLOMAX) 0.4 MG CAPS capsule TAKE 1 CAPSULE EVERY DAY   No facility-administered encounter medications on file as of 08/22/2019.    Activities of Daily Living In your present state of health, do you have any difficulty performing the following activities: 08/22/2019  Hearing? Y  Comment no hearing aids  Vision? N  Comment reading glasses. [email or web address]  Difficulty  concentrating or making decisions? N  Walking or climbing stairs? N  Dressing or bathing? N  Doing errands, shopping? N  Preparing Food and eating ? N  Using the Toilet? N  In the past six months, have you accidently leaked urine? N  Do you have problems with loss of bowel control? N  Managing your Medications? N  Managing your Finances? N  Housekeeping or managing your Housekeeping? N  Some recent data might be hidden    Patient Care Team: Valerie Roys, DO as PCP - General (Family Medicine) [name], MD as Consulting Physician (Cardiology) Deboraha Sprang, MD as Consulting Physician (Cardiology)   Assessment:   This is a routine wellness examination for [facility].  Exercise Activities and Dietary recommendations Current Exercise Habits: The patient does not participate in regular exercise at present, Exercise limited by: None identified  Goals Addressed   None     Fall Risk: Fall Risk  08/22/2019 03/16/2017 02/28/2016 12/20/2014  Falls in the past year? 0 No No No   Number falls in past yr: 0 - - -  Injury with Fall? 0 - - -    FALL RISK PREVENTION PERTAINING TO THE HOME:  Any stairs in or around the home? Yes  If so, are there any without handrails? No   Home free of loose throw rugs in walkways, pet beds, electrical cords, etc? Yes  Adequate lighting in your home to reduce risk of falls? Yes   ASSISTIVE DEVICES UTILIZED TO PREVENT FALLS:  Life alert? No  Use of a cane, walker or w/c? No  Grab bars in the bathroom? No  Shower chair or bench in shower? No  Elevated toilet seat or a handicapped toilet? No   TIMED UP AND GO:  Unable to perform   Depression Screen PHQ 2/9 Scores 08/22/2019 03/16/2017 02/28/2016 12/20/2014  PHQ - 2 Score 0 0 0 0    Cognitive Function     6CIT Screen 03/16/2017  What Year? 0 points  What month? 0 points  What time? 0 points  Count back from 20 0 points  Months in reverse 0 points  Repeat phrase 0 points  Total Score 0    Immunization History  Administered Date(s) Administered  . Influenza, High Dose Seasonal PF 03/16/2017  . Influenza-Unspecified 12/30/2018  . PFIZER SARS-COV-2 Vaccination 03/15/2019, 04/05/2019  . Pneumococcal Conjugate-13 12/15/2013  . Tdap 08/25/2011  . Zoster 08/25/2011    Qualifies for Shingles Vaccine? Yes  Zostavax completed 2013. Due for Shingrix. Education has been provided regarding the importance of this vaccine. Pt has been advised to call insurance company to determine out of pocket expense. Advised may also receive vaccine at local pharmacy or Health Dept. Verbalized acceptance and understanding.  Tdap: up to date   Flu Vaccine: up to date   Pneumococcal Vaccine: up to date   Covid-19 Vaccine:  Completed vaccines  Screening Tests Health Maintenance  Topic Date Due  . PNA vac Low Risk Adult (2 of 2 - PPSV23) 12/16/2014  . INFLUENZA VACCINE  10/09/2019  . TETANUS/TDAP  08/24/2021  . COVID-19 Vaccine  Completed  . Hepatitis C Screening  Completed   Cancer  Screenings:  Colorectal Screening: no longer required   Lung Cancer Screening: (Low Dose CT Chest recommended if Age 43-80 years, 30 pack-year currently smoking OR have quit w/in 15years.) does not qualify.     Additional Screening:  Hepatitis C Screening: does qualify; Completed 2019  Vision Screening: Recommended annual  ophthalmology exams for early detection of glaucoma and other disorders of the eye. Is the patient up to date with their annual eye exam?  Yes  Who is the provider or what is the name of the office in which the pt attends annual eye exams? Dr.[email or web address]  Dental Screening: Recommended annual dental exams for proper oral hygiene  Community Resource Referral:  CRR required this visit?  No        Plan:  I have personally reviewed and addressed the Medicare Annual Wellness questionnaire and have noted the following in the patient's chart:  A. Medical and social history B. Use of alcohol, tobacco or illicit drugs  C. Current medications and supplements D. Functional ability and status E.  Nutritional status F.  Physical activity G. Advance directives H. List of other physicians I.  Hospitalizations, surgeries, and ER visits in previous 12 months J.  [place] such as hearing and vision if needed, cognitive and depression L. Referrals and appointments   In addition, I have reviewed and discussed with patient certain preventive protocols, quality metrics, and best practice recommendations. A written personalized care plan for preventive services as well as general preventive health recommendations were provided to patient.   Signed,   Bevelyn Ngo, LPN  9/47/1252 Nurse Health Advisor   Nurse Notes: none

## 2019-08-29 ENCOUNTER — Encounter: Payer: Self-pay | Admitting: Family Medicine

## 2019-08-29 ENCOUNTER — Ambulatory Visit (INDEPENDENT_AMBULATORY_CARE_PROVIDER_SITE_OTHER): Payer: Medicare HMO | Admitting: Family Medicine

## 2019-08-29 ENCOUNTER — Ambulatory Visit: Payer: Medicare HMO

## 2019-08-29 ENCOUNTER — Other Ambulatory Visit: Payer: Self-pay

## 2019-08-29 VITALS — BP 149/86 | HR 62 | Temp 97.9°F | Ht 68.9 in | Wt 184.6 lb

## 2019-08-29 DIAGNOSIS — I5022 Chronic systolic (congestive) heart failure: Secondary | ICD-10-CM | POA: Diagnosis not present

## 2019-08-29 DIAGNOSIS — I440 Atrioventricular block, first degree: Secondary | ICD-10-CM | POA: Diagnosis not present

## 2019-08-29 DIAGNOSIS — K2100 Gastro-esophageal reflux disease with esophagitis, without bleeding: Secondary | ICD-10-CM

## 2019-08-29 DIAGNOSIS — Z95810 Presence of automatic (implantable) cardiac defibrillator: Secondary | ICD-10-CM | POA: Diagnosis not present

## 2019-08-29 DIAGNOSIS — Z13220 Encounter for screening for lipoid disorders: Secondary | ICD-10-CM | POA: Diagnosis not present

## 2019-08-29 DIAGNOSIS — I420 Dilated cardiomyopathy: Secondary | ICD-10-CM

## 2019-08-29 DIAGNOSIS — Z1283 Encounter for screening for malignant neoplasm of skin: Secondary | ICD-10-CM | POA: Diagnosis not present

## 2019-08-29 DIAGNOSIS — I472 Ventricular tachycardia, unspecified: Secondary | ICD-10-CM

## 2019-08-29 DIAGNOSIS — R350 Frequency of micturition: Secondary | ICD-10-CM | POA: Diagnosis not present

## 2019-08-29 DIAGNOSIS — N401 Enlarged prostate with lower urinary tract symptoms: Secondary | ICD-10-CM

## 2019-08-29 DIAGNOSIS — Z0000 Encounter for general adult medical examination without abnormal findings: Secondary | ICD-10-CM | POA: Diagnosis not present

## 2019-08-29 LAB — URINALYSIS, ROUTINE W REFLEX MICROSCOPIC
Bilirubin, UA: NEGATIVE
Glucose, UA: NEGATIVE
Ketones, UA: NEGATIVE
Leukocytes,UA: NEGATIVE
Nitrite, UA: NEGATIVE
Protein,UA: NEGATIVE
RBC, UA: NEGATIVE
Specific Gravity, UA: 1.015 (ref 1.005–1.030)
Urobilinogen, Ur: 0.2 mg/dL (ref 0.2–1.0)
pH, UA: 5.5 (ref 5.0–7.5)

## 2019-08-29 MED ORDER — DICLOFENAC SODIUM 1 % EX GEL: 4.0000 g | Freq: Four times a day (QID) | CUTANEOUS | 12 refills | Status: DC

## 2019-08-29 MED ORDER — TAMSULOSIN HCL 0.4 MG PO CAPS: 0.4000 mg | ORAL_CAPSULE | Freq: Every day | ORAL | 3 refills | Status: DC

## 2019-08-29 MED ORDER — FINASTERIDE 5 MG PO TABS: 5.0000 mg | ORAL_TABLET | Freq: Every day | ORAL | 3 refills | Status: DC

## 2019-08-29 MED ORDER — OMEPRAZOLE 20 MG PO CPDR: 20.0000 mg | DELAYED_RELEASE_CAPSULE | Freq: Every day | ORAL | 3 refills | Status: DC

## 2019-08-29 NOTE — Progress Notes (Signed)
BP (!) 149/86 (BP Location: Left Arm, Cuff Size: Normal)   Pulse 62   Temp 97.9 F (36.6 C) (Oral)   Ht 5' 8.9" (1.75 m)   Wt 184 lb 9.6 oz (83.7 kg)   SpO2 100%   BMI 27.34 kg/m    Subjective:    Patient ID: Leroy Merritt, male    DOB: 1945-08-11, 74 y.o.   MRN: [id number]  HPI: Leroy Merritt is a 74 y.o. male presenting on 08/29/2019 for comprehensive medical examination. Current medical complaints include:  Has an arthritic knee. He saw Dr. Sabra Heck and is likely going to need a replacement.   BPH BPH status: controlled Satisfied with current treatment?: yes Medication side effects: no Medication compliance: excellent compliance Duration: chronic Nocturia: 1/night Urinary frequency:no Incomplete voiding: no Urgency: no Weak urinary stream: no Straining to start stream: no Dysuria: no Onset: gradual Severity: mild  GERD GERD control status: controlled  Satisfied with current treatment? yes Heartburn frequency: rarely Medication side effects: no  Medication compliance: good Dysphagia: no Odynophagia:  no Hematemesis: no Blood in stool: no EGD: no  Interim Problems from his last visit: no  Depression Screen done today and results listed below:  Depression screen [facility] 2/9 08/22/2019 03/16/2017 02/28/2016 12/20/2014  Decreased Interest 0 0 0 0  Down, Depressed, Hopeless 0 0 0 0  PHQ - 2 Score 0 0 0 0    Past Medical History:  Past Medical History:  Diagnosis Date  . Arthritis   . 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 BPH (benign prostatic hyperplasia)   . Cataract   . Coronary artery disease, non-occlusive   . Diverticulosis 2017  . Dysphagia 12/20/2014  . GERD (gastroesophageal reflux disease)   . HFrEF (heart failure with reduced ejection fraction) ([place])   . Hypercholesterolemia   . 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 Ventricular tachycardia ([place]) 04/28/2017   a.  Status post SJM dual-chamber ICD in 04/2017     Surgical History:  Past Surgical History:  Procedure Laterality Date  . CARDIAC CATHETERIZATION  04/28/2017  . CATARACT EXTRACTION W/ INTRAOCULAR LENS IMPLANT Right 2017  . CATARACT EXTRACTION W/PHACO Left 05/20/2019   Procedure: CATARACT EXTRACTION PHACO AND INTRAOCULAR LENS PLACEMENT ([place]) LEFT;  Surgeon: Birder Robson, MD;  Location: [facility];  Service: Ophthalmology;  Laterality: Left;  [place] 01:19.7 CDE 14.58 Fluid Pack Lot # [id number]  . ESOPHAGOGASTRODUODENOSCOPY (EGD) WITH PROPOFOL N/A 02/19/2015   Procedure: ESOPHAGOGASTRODUODENOSCOPY (EGD) WITH PROPOFOL and esophageal dilation.;  Surgeon: Lucilla Lame, MD;  Location: [place];  Service: Endoscopy;  Laterality: N/A;  . EYE SURGERY    . ICD IMPLANT N/A 04/29/2017   Procedure: ICD IMPLANT;  Surgeon: Evans Lance, MD;  Location: [facility];  Service: Cardiovascular;  Laterality: N/A;  . LEFT HEART CATH AND CORONARY ANGIOGRAPHY N/A 04/28/2017   Procedure: LEFT HEART CATH AND CORONARY ANGIOGRAPHY;  Surgeon: Martinique, Peter M, MD;  Location: [facility];  Service: Cardiovascular;  Laterality: N/A;  . PROSTATE BIOPSY  ~ 2012  . RETINAL DETACHMENT SURGERY Right 1990s   "torn retina"    Medications:  Current Outpatient Medications on File Prior to Visit  Medication Sig  . Multiple Vitamins-Minerals (MULTIVITAMIN ADULT PO) Take 1 tablet by mouth every morning.   . sacubitril-valsartan (ENTRESTO) 49-51 MG Take 1 tablet by mouth 2 (two) times daily.  . sotalol (BETAPACE) 80 MG tablet TAKE 1 TABLET TWICE DAILY  . spironolactone (  ALDACTONE) 25 MG tablet Take 0.5 tablets (12.5 mg total) by mouth daily.   No current facility-administered medications on file prior to visit.    Allergies:  No Known Allergies  Social History:  Social History   Socioeconomic History  . Marital status: Married    Spouse name: Not on file  . Number of children: Not on file  . Years of education: college  . Highest education  level: Not on file  Occupational History  . Not on file  Tobacco Use  . Smoking status: Never Smoker  . Smokeless tobacco: Never Used  Vaping Use  . Vaping Use: Never used  Substance and Sexual Activity  . Alcohol use: Yes    Alcohol/week: 1.0 standard drink    Types: 1 Glasses of wine per week    Comment: 1 Drink/Week  . Drug use: No  . Sexual activity: Yes    Birth control/protection: None    Comment: NA  Other Topics Concern  . Not on file  Social History Narrative  . Not on file   Social Determinants of Health   Financial Resource Strain:   . Difficulty of Paying Living Expenses:   Food Insecurity:   . Worried About Charity fundraiser in the Last Year:   . Arboriculturist in the Last Year:   Transportation Needs:   . Film/video editor (Medical):   [name] Lack of Transportation (Non-Medical):   Physical Activity:   . Days of Exercise per Week:   . Minutes of Exercise per Session:   Stress:   . Feeling of Stress :   Social Connections:   . Frequency of Communication with Friends and Family:   . Frequency of Social Gatherings with Friends and Family:   . Attends Religious Services:   . Active Member of Clubs or Organizations:   . Attends Archivist Meetings:   [name] Marital Status:   Intimate Partner Violence:   . Fear of Current or Ex-Partner:   . Emotionally Abused:   [name] Physically Abused:   . Sexually Abused:    Social History   Tobacco Use  Smoking Status Never Smoker  Smokeless Tobacco Never Used   Social History   Substance and Sexual Activity  Alcohol Use Yes  . Alcohol/week: 1.0 standard drink  . Types: 1 Glasses of wine per week   Comment: 1 Drink/Week    Family History:  Family History  Problem Relation Age of Onset  . Arthritis Mother   . Diabetes Father   . Hypertension Father   . Heart disease Brother   . Hyperlipidemia Brother   . Hypertension Brother   . Lung cancer Daughter   . Cancer Daughter     Past medical  history, surgical history, medications, allergies, family history and social history reviewed with patient today and changes made to appropriate areas of the chart.   Review of Systems  Constitutional: Positive for malaise/fatigue. Negative for chills, diaphoresis, fever and weight loss.  HENT: Positive for hearing loss. Negative for congestion, ear discharge, ear pain, nosebleeds, sinus pain, sore throat and tinnitus.   Eyes: Negative.   Respiratory: Negative.  Negative for stridor.   Cardiovascular: Negative.  Negative for chest pain, palpitations, orthopnea, claudication, leg swelling and PND.  Gastrointestinal: Positive for heartburn. Negative for abdominal pain, blood in stool, constipation, diarrhea, melena, nausea and vomiting.  Genitourinary: Negative.   Musculoskeletal: Negative.   Skin: Negative.   Neurological: Negative.   Endo/Heme/Allergies: Negative.  Psychiatric/Behavioral: Negative.     All other ROS negative except what is listed above and in the HPI.      Objective:    BP (!) 149/86 (BP Location: Left Arm, Cuff Size: Normal)   Pulse 62   Temp 97.9 F (36.6 C) (Oral)   Ht 5' 8.9" (1.75 m)   Wt 184 lb 9.6 oz (83.7 kg)   SpO2 100%   BMI 27.34 kg/m   Wt Readings from Last 3 Encounters:  08/29/19 184 lb 9.6 oz (83.7 kg)  08/22/19 180 lb (81.6 kg)  05/20/19 179 lb 14.3 oz (81.6 kg)    Physical Exam Vitals and nursing note reviewed.  Constitutional:      General: He is not in acute distress.    Appearance: Normal appearance. He is normal weight. He is not ill-appearing, toxic-appearing or diaphoretic.  HENT:     Head: Normocephalic and atraumatic.     Right Ear: Tympanic membrane, ear canal and external ear normal. There is no impacted cerumen.     Left Ear: Tympanic membrane, ear canal and external ear normal. There is no impacted cerumen.     Nose: Nose normal. No congestion or rhinorrhea.     Mouth/Throat:     Mouth: Mucous membranes are moist.      Pharynx: Oropharynx is clear. No oropharyngeal exudate or posterior oropharyngeal erythema.  Eyes:     General: No scleral icterus.       Right eye: No discharge.        Left eye: No discharge.     Extraocular Movements: Extraocular movements intact.     Conjunctiva/sclera: Conjunctivae normal.     Pupils: Pupils are equal, round, and reactive to light.  Neck:     Vascular: No carotid bruit.  Cardiovascular:     Rate and Rhythm: Normal rate and regular rhythm.     Pulses: Normal pulses.     Heart sounds: No murmur heard.  No friction rub. No gallop.   Pulmonary:     Effort: Pulmonary effort is normal. No respiratory distress.     Breath sounds: Normal breath sounds. No stridor. No wheezing, rhonchi or rales.  Chest:     Chest wall: No tenderness.  Abdominal:     General: Abdomen is flat. Bowel sounds are normal. There is no distension.     Palpations: Abdomen is soft. There is no mass.     Tenderness: There is no abdominal tenderness. There is no right CVA tenderness, left CVA tenderness, guarding or rebound.     Hernia: No hernia is present.  Genitourinary:    Comments: Genital exam deferred with shared decision making Musculoskeletal:        General: No swelling, tenderness, deformity or signs of injury.     Cervical back: Normal range of motion and neck supple. No rigidity. No muscular tenderness.     Right lower leg: No edema.     Left lower leg: No edema.  Lymphadenopathy:     Cervical: No cervical adenopathy.  Skin:    General: Skin is warm and dry.     Capillary Refill: Capillary refill takes less than 2 seconds.     Coloration: Skin is not jaundiced or pale.     Findings: No bruising, erythema, lesion or rash.  Neurological:     General: No focal deficit present.     Mental Status: He is alert and oriented to person, place, and time.     Cranial Nerves: No cranial  nerve deficit.     Sensory: No sensory deficit.     Motor: No weakness.     Coordination:  Coordination normal.     Gait: Gait normal.     Deep Tendon Reflexes: Reflexes normal.  Psychiatric:        Mood and Affect: Mood normal.        Behavior: Behavior normal.        Thought Content: Thought content normal.        Judgment: Judgment normal.     Results for orders placed or performed in visit on 08/19/19  CUP PACEART REMOTE DEVICE CHECK  Result Value Ref Range   Date Time Interrogation Session [id number]    Pulse Generator Manufacturer [employer]    Pulse Gen Model 2411-36C Ellipse DR    Pulse Gen Serial Number [id number]    Clinic Name [facility]    Implantable Pulse Generator Type Implantable Cardiac Defibulator    Implantable Pulse Generator Implant Date [id number]    Implantable Lead Manufacturer [facility]    Implantable Lead Model 7122 Durata    Implantable Lead Serial Number [id number]    Implantable Lead Implant Date 22025427    Implantable Lead Location Detail 1 APEX    Implantable Lead Location [id number]    Implantable Lead Manufacturer [facility]    Implantable Lead Model LPA1200M Tendril MRI    Implantable Lead Serial Number [id number]    Implantable Lead Implant Date [id number]    Implantable Lead Location Detail 1 UNKNOWN    Implantable Lead Location [id number]    Lead Channel Setting Sensing Sensitivity 0.5 mV   Lead Channel Setting Sensing Adaptation Mode Adaptive Sensing    Lead Channel Setting Pacing Amplitude 2.0 V   Lead Channel Setting Pacing Pulse Width 0.5 ms   Lead Channel Setting Pacing Amplitude 2.5 V   Lead Channel Status NULL    Lead Channel Impedance Value 380 ohm   Lead Channel Sensing Intrinsic Amplitude 1.1 mV   Lead Channel Pacing Threshold Amplitude 0.75 V   Lead Channel Pacing Threshold Pulse Width 0.5 ms   Lead Channel Status NULL    Lead Channel Impedance Value 460 ohm   Lead Channel Sensing Intrinsic Amplitude 11.8 mV   Lead Channel Pacing Threshold Amplitude 1.0 V   Lead Channel Pacing Threshold Pulse Width 0.5 ms   HighPow Impedance 79 ohm    HighPow Impedance 79 ohm   HighPow Imped Status NULL    HighPow Imped Status NULL    Battery Status MOS    Battery Remaining Longevity 76 mo   Battery Remaining Percentage 76.0 %   Battery Voltage 2.99 V   Brady Statistic RA Percent Paced 1.0 %   Brady Statistic RV Percent Paced 1.0 %   Brady Statistic AP VP Percent 1.0 %   Brady Statistic AS VP Percent 1.0 %   Brady Statistic AP VS Percent 1.0 %   Brady Statistic AS VS Percent 97.0 %      Assessment & Plan:   Problem List Items Addressed This Visit      Cardiovascular and Mediastinum   Ventricular tachycardia ([place])    Has ICD in place. Continue to follow with cardiology. Stable at this time. Call with any concerns. Will check labs today and share with cardiology.      Relevant Orders   CBC with Differential/Platelet   Comprehensive metabolic panel   TSH   Dilated cardiomyopathy ([place])    Has ICD in place. Continue to follow with cardiology.  Stable at this time. Call with any concerns. Will check labs today and share with cardiology.      Relevant Orders   CBC with Differential/Platelet   Comprehensive metabolic panel   TSH   Chronic systolic heart failure ([place])    Euvolemic today. Continue to follow with cardiology. Stable at this time. Call with any concerns. Will check labs today and share with cardiology.      Relevant Orders   CBC with Differential/Platelet   Comprehensive metabolic panel   TSH   First degree AV block    Has ICD in place. Continue to follow with cardiology. Stable at this time. Call with any concerns. Will check labs today and share with cardiology.        Digestive   Reflux esophagitis    Under good control on current regimen. Continue current regimen. Continue to monitor. Call with any concerns. Refills given. Labs drawn today.       Relevant Medications   omeprazole (PRILOSEC) 20 MG capsule   Other Relevant Orders   CBC with Differential/Platelet   Comprehensive metabolic panel   TSH      Genitourinary   BPH (benign prostatic hyperplasia)    Under good control on current regimen. Continue current regimen. Continue to monitor. Call with any concerns. Refills given. Labs drawn today.      Relevant Medications   tamsulosin (FLOMAX) 0.4 MG CAPS capsule   finasteride (PROSCAR) 5 MG tablet   Other Relevant Orders   CBC with Differential/Platelet   Comprehensive metabolic panel   PSA   TSH   Urinalysis, Routine w reflex microscopic     Other   ICD (implantable cardioverter-defibrillator) in place    Stable. Continue to follow with cardiology. Call with any concerns. Continue to monitor.        Other Visit Diagnoses    Routine general medical examination at a health care facility    -  Primary   Vaccines up to date. Screening labs checked today. Continue diet and exercise. Call with any concerns. Continue to monitor.    Screening for skin cancer       Referral to dermatology made today.    Relevant Orders   Ambulatory referral to Dermatology   Screening for cholesterol level       Checking labs today. Await results. Treat as needed.    Relevant Orders   Lipid Panel w/o Chol/HDL Ratio       LABORATORY TESTING:  Health maintenance labs ordered today as discussed above.   The natural history of prostate cancer and ongoing controversy regarding screening and potential treatment outcomes of prostate cancer has been discussed with the patient. The meaning of a false positive PSA and a false negative PSA has been discussed. He indicates understanding of the limitations of this screening test and wishes to proceed with screening PSA testing.   IMMUNIZATIONS:   - Tdap: Tetanus vaccination status reviewed: last tetanus booster within 10 years. - Influenza: Postponed to flu season - Pneumovax: Refused - Prevnar: Up to date - COVID: Up to date  SCREENING: - Colonoscopy: Not applicable  Discussed with patient purpose of the colonoscopy is to detect colon cancer at  curable precancerous or early stages   Follow up plan: NEXT PREVENTATIVE PHYSICAL DUE IN 1 YEAR. Return in about 1 year (around 08/28/2020), or wellness/physical.

## 2019-08-29 NOTE — Assessment & Plan Note (Signed)
Euvolemic today. Continue to follow with cardiology. Stable at this time. Call with any concerns. Will check labs today and share with cardiology.

## 2019-08-29 NOTE — Patient Instructions (Signed)
Health Maintenance After Age 76 After age 76, you are at a higher risk for certain long-term diseases and infections as well as injuries from falls. Falls are a major cause of broken bones and head injuries in people who are older than age 76. Getting regular preventive care can help to keep you healthy and well. Preventive care includes getting regular testing and making lifestyle changes as recommended by your health care provider. Talk with your health care provider about:  Which screenings and tests you should have. A screening is a test that checks for a disease when you have no symptoms.  A diet and exercise plan that is right for you. What should I know about screenings and tests to prevent falls? Screening and testing are the best ways to find a health problem early. Early diagnosis and treatment give you the best chance of managing medical conditions that are common after age 76. Certain conditions and lifestyle choices may make you more likely to have a fall. Your health care provider may recommend:  Regular vision checks. Poor vision and conditions such as cataracts can make you more likely to have a fall. If you wear glasses, make sure to get your prescription updated if your vision changes.  Medicine review. Work with your health care provider to regularly review all of the medicines you are taking, including over-the-counter medicines. Ask your health care provider about any side effects that may make you more likely to have a fall. Tell your health care provider if any medicines that you take make you feel dizzy or sleepy.  Osteoporosis screening. Osteoporosis is a condition that causes the bones to get weaker. This can make the bones weak and cause them to break more easily.  Blood pressure screening. Blood pressure changes and medicines to control blood pressure can make you feel dizzy.  Strength and balance checks. Your health care provider may recommend certain tests to check your  strength and balance while standing, walking, or changing positions.  Foot health exam. Foot pain and numbness, as well as not wearing proper footwear, can make you more likely to have a fall.  Depression screening. You may be more likely to have a fall if you have a fear of falling, feel emotionally low, or feel unable to do activities that you used to do.  Alcohol use screening. Using too much alcohol can affect your balance and may make you more likely to have a fall. What actions can I take to lower my risk of falls? General instructions  Talk with your health care provider about your risks for falling. Tell your health care provider if: ? You fall. Be sure to tell your health care provider about all falls, even ones that seem minor. ? You feel dizzy, sleepy, or off-balance.  Take over-the-counter and prescription medicines only as told by your health care provider. These include any supplements.  Eat a healthy diet and maintain a healthy weight. A healthy diet includes low-fat dairy products, low-fat (lean) meats, and fiber from whole grains, beans, and lots of fruits and vegetables. Home safety  Remove any tripping hazards, such as rugs, cords, and clutter.  Install safety equipment such as grab bars in bathrooms and safety rails on stairs.  Keep rooms and walkways well-lit. Activity   Follow a regular exercise program to stay fit. This will help you maintain your balance. Ask your health care provider what types of exercise are appropriate for you.  If you need a cane or   walker, use it as recommended by your health care provider.  Wear supportive shoes that have nonskid soles. Lifestyle  Do not drink alcohol if your health care provider tells you not to drink.  If you drink alcohol, limit how much you have: ? 0-1 drink a day for women. ? 0-2 drinks a day for men.  Be aware of how much alcohol is in your drink. In the [place] one drink equals one typical bottle of beer (12  oz), one-half glass of wine (5 oz), or one shot of hard liquor (1 oz).  Do not use any products that contain nicotine or tobacco, such as cigarettes and e-cigarettes. If you need help quitting, ask your health care provider. Summary  Having a healthy lifestyle and getting preventive care can help to protect your health and wellness after age 76.  Screening and testing are the best way to find a health problem early and help you avoid having a fall. Early diagnosis and treatment give you the best chance for managing medical conditions that are more common for people who are older than age 76.  Falls are a major cause of broken bones and head injuries in people who are older than age 76. Take precautions to prevent a fall at home.  Work with your health care provider to learn what changes you can make to improve your health and wellness and to prevent falls. This information is not intended to replace advice given to you by your health care provider. Make sure you discuss any questions you have with your health care provider. Document Revised: 06/17/2018 Document Reviewed: 01/07/2017 Elsevier Patient Education  2020 Elsevier Inc.  

## 2019-08-29 NOTE — Assessment & Plan Note (Signed)
Stable. Continue to follow with cardiology. Call with any concerns. Continue to monitor.  

## 2019-08-29 NOTE — Assessment & Plan Note (Signed)
Under good control on current regimen. Continue current regimen. Continue to monitor. Call with any concerns. Refills given. Labs drawn today.   

## 2019-08-29 NOTE — Assessment & Plan Note (Signed)
Has ICD in place. Continue to follow with cardiology. Stable at this time. Call with any concerns. Will check labs today and share with cardiology.

## 2019-08-30 LAB — LIPID PANEL W/O CHOL/HDL RATIO
Cholesterol, Total: 165 mg/dL (ref 100–199)
HDL: 50 mg/dL (ref >39)
LDL Chol Calc (NIH): 99 mg/dL (ref 0–99)
Triglycerides: 85 mg/dL (ref 0–149)
VLDL Cholesterol Cal: 16 mg/dL (ref 5–40)

## 2019-08-30 LAB — CBC WITH DIFFERENTIAL/PLATELET
Basophils Absolute: 0 10*3/uL (ref 0.0–0.2)
Basos: 1 %
EOS (ABSOLUTE): 0.2 10*3/uL (ref 0.0–0.4)
Eos: 2 %
Hematocrit: 46.2 % (ref 37.5–51.0)
Hemoglobin: 15.3 g/dL (ref 13.0–17.7)
Immature Grans (Abs): 0 10*3/uL (ref 0.0–0.1)
Immature Granulocytes: 0 %
Lymphocytes Absolute: 1.6 10*3/uL (ref 0.7–3.1)
Lymphs: 22 %
MCH: 30.3 pg (ref 26.6–33.0)
MCHC: 33.1 g/dL (ref 31.5–35.7)
MCV: 92 fL (ref 79–97)
Monocytes Absolute: 0.6 10*3/uL (ref 0.1–0.9)
Monocytes: 9 %
Neutrophils Absolute: 4.8 10*3/uL (ref 1.4–7.0)
Neutrophils: 66 %
Platelets: 148 10*3/uL — ABNORMAL LOW (ref 150–450)
RBC: 5.05 x10E6/uL (ref 4.14–5.80)
RDW: 13.4 % (ref 11.6–15.4)
WBC: 7.4 10*3/uL (ref 3.4–10.8)

## 2019-08-30 LAB — COMPREHENSIVE METABOLIC PANEL
ALT: 11 IU/L (ref 0–44)
AST: 17 IU/L (ref 0–40)
Albumin/Globulin Ratio: 1.5 (ref 1.2–2.2)
Albumin: 4 g/dL (ref 3.7–4.7)
Alkaline Phosphatase: 55 IU/L (ref 48–121)
BUN/Creatinine Ratio: 19 (ref 10–24)
BUN: 22 mg/dL (ref 8–27)
Bilirubin Total: 0.3 mg/dL (ref 0.0–1.2)
CO2: 23 mmol/L (ref 20–29)
Calcium: 9.1 mg/dL (ref 8.6–10.2)
Chloride: 105 mmol/L (ref 96–106)
Creatinine, Ser: 1.13 mg/dL (ref 0.76–1.27)
GFR calc Af Amer: 73 mL/min/{1.73_m2} (ref >59)
GFR calc non Af Amer: 63 mL/min/{1.73_m2} (ref >59)
Globulin, Total: 2.6 g/dL (ref 1.5–4.5)
Glucose: 106 mg/dL — ABNORMAL HIGH (ref 65–99)
Potassium: 4.7 mmol/L (ref 3.5–5.2)
Sodium: 139 mmol/L (ref 134–144)
Total Protein: 6.6 g/dL (ref 6.0–8.5)

## 2019-08-30 LAB — PSA: Prostate Specific Ag, Serum: 1.2 ng/mL (ref 0.0–4.0)

## 2019-08-30 LAB — TSH: TSH: 1.35 u[IU]/mL (ref 0.450–4.500)

## 2019-09-05 ENCOUNTER — Other Ambulatory Visit: Payer: Self-pay | Admitting: Physician Assistant

## 2019-09-20 ENCOUNTER — Encounter: Payer: Self-pay | Admitting: Internal Medicine

## 2019-09-20 ENCOUNTER — Ambulatory Visit (INDEPENDENT_AMBULATORY_CARE_PROVIDER_SITE_OTHER): Payer: Medicare HMO | Admitting: Internal Medicine

## 2019-09-20 ENCOUNTER — Other Ambulatory Visit: Payer: Self-pay

## 2019-09-20 VITALS — BP 122/80 | HR 60 | Ht 70.0 in | Wt 182.0 lb

## 2019-09-20 DIAGNOSIS — Z95810 Presence of automatic (implantable) cardiac defibrillator: Secondary | ICD-10-CM | POA: Diagnosis not present

## 2019-09-20 DIAGNOSIS — I428 Other cardiomyopathies: Secondary | ICD-10-CM

## 2019-09-20 DIAGNOSIS — I472 Ventricular tachycardia, unspecified: Secondary | ICD-10-CM

## 2019-09-20 NOTE — Patient Instructions (Signed)
Medication Instructions:  Your physician has recommended you make the following change in your medication:  1- STOP Aldactone.  *If you need a refill on your cardiac medications before your next appointment, please call your pharmacy*   Follow-Up: At [facility], you and your health needs are our priority.  As part of our continuing mission to provide you with exceptional heart care, we have created designated Provider Care Teams.  These Care Teams include your primary Cardiologist (physician) and Advanced Practice Providers (APPs -  Physician Assistants and Nurse Practitioners) who all work together to provide you with the care you need, when you need it.  We recommend signing up for the patient portal called "MyChart".  Sign up information is provided on this After Visit Summary.  MyChart is used to connect with patients for Virtual Visits (Telemedicine).  Patients are able to view lab/test results, encounter notes, upcoming appointments, etc.  Non-urgent messages can be sent to your provider as well.   To learn more about what you can do with MyChart, go to [email or web address].    Your next appointment:   6 month(s)  The format for your next appointment:   In Person  Provider:   Virl Axe, MD   Other Instructions Please contact your PCP to inquire about a sleep study.

## 2019-09-20 NOTE — Progress Notes (Signed)
Patient Care Team: Valerie Roys, DO as PCP - General (Family Medicine) [name], MD as Consulting Physician (Cardiology) Deboraha Sprang, MD as Consulting Physician (Cardiology)   HPI  Leroy Merritt is a 74 y.o. 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  Sotalol dose was decreased from 160--80; also started on transition to [facility] and addition of spironolactone--tolerating well   Dyspnea on exertion.  No chest pain.  Bilateral breath discomfort of breath swelling.  No peripheral edema.  Fatigue.  Sleep disordered breathing and daytime somnolence  DATE TEST EF   2/19 cMRI   27 % Mild LGE   RVE but w/o RV anuerysm  2/19 Echo   30-35 %   2/19 LHC 25-30% No obst CAD  1/21 Echo  50-55%    Date Cr K Mg Hgb  2/19 1.11 4.2      6/21 1.13 4.7 2.3 (1/21) 15.3     Records and Results Reviewed   Past Medical History:  Diagnosis Date  . Arthritis   . 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 BPH (benign prostatic hyperplasia)   . Cataract   . Coronary artery disease, non-occlusive   . Diverticulosis 2017  . Dysphagia 12/20/2014  . GERD (gastroesophageal reflux disease)   . HFrEF (heart failure with reduced ejection fraction) ([place])   . Hypercholesterolemia   . 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 Ventricular tachycardia ([place]) 04/28/2017   a.  Status post SJM dual-chamber ICD in 04/2017    Past Surgical History:  Procedure Laterality Date  . CARDIAC CATHETERIZATION  04/28/2017  . CATARACT EXTRACTION W/ INTRAOCULAR LENS IMPLANT Right 2017  . CATARACT EXTRACTION W/PHACO Left 05/20/2019   Procedure: CATARACT EXTRACTION PHACO AND INTRAOCULAR LENS PLACEMENT ([place]) LEFT;  Surgeon: Birder Robson, MD;  Location: [facility] ORS;  Service: Ophthalmology;  Laterality: Left;  [place] 01:19.7 CDE  14.58 Fluid Pack Lot # [id number]  . ESOPHAGOGASTRODUODENOSCOPY (EGD) WITH PROPOFOL N/A 02/19/2015   Procedure: ESOPHAGOGASTRODUODENOSCOPY (EGD) WITH PROPOFOL and esophageal dilation.;  Surgeon: Lucilla Lame, MD;  Location: [place];  Service: Endoscopy;  Laterality: N/A;  . EYE SURGERY    . ICD IMPLANT N/A 04/29/2017   Procedure: ICD IMPLANT;  Surgeon: Evans Lance, MD;  Location: [place] [facility];  Service: Cardiovascular;  Laterality: N/A;  . LEFT HEART CATH AND CORONARY ANGIOGRAPHY N/A 04/28/2017   Procedure: LEFT HEART CATH AND CORONARY ANGIOGRAPHY;  Surgeon: Martinique, Peter M, MD;  Location: [place] [facility];  Service: Cardiovascular;  Laterality: N/A;  . PROSTATE BIOPSY  ~ 2012  . RETINAL DETACHMENT SURGERY Right 1990s   "torn retina"    Current Meds  Medication Sig  . diclofenac Sodium (VOLTAREN) 1 % GEL Apply 4 g topically 4 (four) times daily.  . finasteride (PROSCAR) 5 MG tablet Take 1 tablet (5 mg total) by mouth at bedtime.  . Multiple Vitamins-Minerals (MULTIVITAMIN ADULT PO) Take 1 tablet by mouth every morning.   [name] omeprazole (PRILOSEC) 20 MG capsule Take 1 capsule (20 mg total) by mouth daily.  . sacubitril-valsartan (ENTRESTO) 49-51 MG Take 1 tablet by mouth 2 (two) times daily.  . sotalol (BETAPACE) 80 MG tablet TAKE 1 TABLET TWICE DAILY  . spironolactone (ALDACTONE) 25 MG tablet TAKE 1/2 TABLET EVERY DAY  . tamsulosin (FLOMAX)  0.4 MG CAPS capsule Take 1 capsule (0.4 mg total) by mouth daily.    No Known Allergies    Review of Systems negative except from HPI and PMH  Physical Exam BP 122/80 (BP Location: Left Arm, Patient Position: Sitting, Cuff Size: Normal)   Pulse 60   Ht 5\' 10"  (1.778 m)   Wt 182 lb (82.6 kg)   SpO2 98%   BMI 26.11 kg/m  Well developed and well nourished in no acute distress HENT normal Neck supple with JVP-flat Clear Device pocket well healed; without hematoma or erythema.  There is no tethering  Regular rate and  rhythm, no* murmur Abd-soft with active BS No Clubbing cyanosis** edema Skin-warm and dry A & Oriented  Grossly normal sensory and motor function  ECG sinus with QT normal    Assessment and  Plan  Nonischemic cardiomyopathy  Ventricular tachycardia  Implantable defibrillator-secondary prevention  St Jude   Orthostatic intolerance  High Risk Medication Surveillance sotalol., aldactone  Fatigue  First-degree AV block  Chronotropically competent.  We will undertake stress testing in the office  No intercurrent ventricular tachycardia.  Fatigue with sleep disordered breathing.  Have asked him to follow-up with his PCP regarding a sleep study.  Gynecomastia and breast tenderness.  We will discontinue spironolactone.  I am not aware of data reviewing the risks of discontinuing Aldactone in the setting of normalized LV function.  We will plan in about 6 months to repeat his echocardiogram.  Sotalol surveillance laboratories are normal.          Current medicines are reviewed at length with the patient today .  The patient does not  have concerns regarding medicines.

## 2019-09-23 LAB — CUP PACEART INCLINIC DEVICE CHECK
Battery Remaining Longevity: 80 mo
Brady Statistic RA Percent Paced: 0 %
Brady Statistic RV Percent Paced: 0.05 %
Date Time Interrogation Session: 20210713110200
HighPow Impedance: 75 Ohm
Implantable Lead Implant Date: 20190220
Implantable Lead Implant Date: 20190220
Implantable Lead Location: 753859
Implantable Lead Location: 753860
Implantable Lead Model: 7122
Implantable Pulse Generator Implant Date: 20190220
Lead Channel Impedance Value: 380 Ohm
Lead Channel Impedance Value: 440 Ohm
Lead Channel Pacing Threshold Amplitude: 0.75 V
Lead Channel Pacing Threshold Amplitude: 1 V
Lead Channel Pacing Threshold Pulse Width: 0.5 ms
Lead Channel Pacing Threshold Pulse Width: 0.5 ms
Lead Channel Sensing Intrinsic Amplitude: 1.8 mV
Lead Channel Sensing Intrinsic Amplitude: 11.8 mV
Lead Channel Setting Pacing Amplitude: 2 V
Lead Channel Setting Pacing Amplitude: 2.5 V
Lead Channel Setting Pacing Pulse Width: 0.5 ms
Lead Channel Setting Sensing Sensitivity: 0.5 mV
Pulse Gen Serial Number: 9787528

## 2019-10-04 ENCOUNTER — Other Ambulatory Visit: Payer: Self-pay | Admitting: Cardiovascular Disease

## 2019-10-04 DIAGNOSIS — I472 Ventricular tachycardia, unspecified: Secondary | ICD-10-CM

## 2019-10-05 NOTE — Telephone Encounter (Signed)
Patient states he is taking a 80 mg tablet bid of sotalol

## 2019-10-05 NOTE — Telephone Encounter (Signed)
Lmovm to verify how pt is taking his Sotalol 80 mg tablet.

## 2019-10-06 ENCOUNTER — Other Ambulatory Visit: Payer: Self-pay

## 2019-10-06 ENCOUNTER — Telehealth: Payer: Self-pay | Admitting: Cardiovascular Disease

## 2019-10-06 DIAGNOSIS — I5022 Chronic systolic (congestive) heart failure: Secondary | ICD-10-CM

## 2019-10-06 DIAGNOSIS — I428 Other cardiomyopathies: Secondary | ICD-10-CM

## 2019-10-06 MED ORDER — ENTRESTO 49-51 MG PO TABS: 1.0000 | ORAL_TABLET | Freq: Two times a day (BID) | ORAL | 0 refills | Status: DC

## 2019-10-06 NOTE — Telephone Encounter (Signed)
sacubitril-valsartan (ENTRESTO) 49-51 MG 180 tablet 0 10/06/2019    Sig - Route: Take 1 tablet by mouth 2 (two) times daily. - Oral   Sent to pharmacy as: sacubitril-valsartan (ENTRESTO) 49-51 MG   Notes to Pharmacy: Patient needs an appt for further refills, Thanks ! [phone]   E-Prescribing Status: Receipt confirmed by pharmacy (10/06/2019  2:12 PM EDT)   Associated Diagnoses  Chronic systolic heart failure ([place])     Nonischemic cardiomyopathy ([place])

## 2019-10-06 NOTE — Telephone Encounter (Signed)
*  STAT* If patient is at the pharmacy, call can be transferred to refill team.   1. Which medications need to be refilled? (please list name of each medication and dose if known) entresto 49-51 mg bid  2. Which pharmacy/location (including street and city if local pharmacy) is medication to be sent to? [facility]  3. Do they need a 30 day or 90 day supply? [place]

## 2019-10-20 ENCOUNTER — Telehealth: Payer: Self-pay | Admitting: Cardiovascular Disease

## 2019-10-20 NOTE — Telephone Encounter (Signed)
-----   Message from Janan Ridge, [place] sent at 10/06/2019  2:09 PM EDT ----- Regarding: Needs Appt. Paitent is past fir for an appointment with Dr. Rockey Situ.   LS 12/2018 F/U 6 months.   Thanks!

## 2019-10-20 NOTE — Telephone Encounter (Signed)
Attempted to schedule.  LMOV to call office.  ° °

## 2019-11-18 ENCOUNTER — Ambulatory Visit (INDEPENDENT_AMBULATORY_CARE_PROVIDER_SITE_OTHER): Payer: Medicare HMO | Admitting: *Deleted

## 2019-11-18 DIAGNOSIS — I420 Dilated cardiomyopathy: Secondary | ICD-10-CM | POA: Diagnosis not present

## 2019-11-18 LAB — CUP PACEART REMOTE DEVICE CHECK
Battery Remaining Longevity: 63 mo
Battery Remaining Percentage: 73 %
Battery Voltage: 2.98 V
Brady Statistic AP VP Percent: 37 %
Brady Statistic AP VS Percent: 18 %
Brady Statistic AS VP Percent: 4.6 %
Brady Statistic AS VS Percent: 36 %
Brady Statistic RA Percent Paced: 51 %
Brady Statistic RV Percent Paced: 42 %
Date Time Interrogation Session: 20210910040022
HighPow Impedance: 75 Ohm
HighPow Impedance: 75 Ohm
Implantable Lead Implant Date: 20190220
Implantable Lead Implant Date: 20190220
Implantable Lead Location: 753859
Implantable Lead Location: 753860
Implantable Lead Model: 7122
Implantable Pulse Generator Implant Date: 20190220
Lead Channel Impedance Value: 380 Ohm
Lead Channel Impedance Value: 440 Ohm
Lead Channel Pacing Threshold Amplitude: 0.75 V
Lead Channel Pacing Threshold Amplitude: 1 V
Lead Channel Pacing Threshold Pulse Width: 0.5 ms
Lead Channel Pacing Threshold Pulse Width: 0.5 ms
Lead Channel Sensing Intrinsic Amplitude: 1.3 mV
Lead Channel Sensing Intrinsic Amplitude: 11.8 mV
Lead Channel Setting Pacing Amplitude: 2 V
Lead Channel Setting Pacing Amplitude: 2.5 V
Lead Channel Setting Pacing Pulse Width: 0.5 ms
Lead Channel Setting Sensing Sensitivity: 0.5 mV
Pulse Gen Serial Number: 9787528

## 2019-11-18 NOTE — Progress Notes (Signed)
Remote ICD transmission.   

## 2019-12-01 ENCOUNTER — Other Ambulatory Visit: Payer: Self-pay

## 2019-12-01 ENCOUNTER — Encounter: Payer: Self-pay | Admitting: Dermatology

## 2019-12-01 ENCOUNTER — Ambulatory Visit: Payer: Medicare HMO | Admitting: Dermatology

## 2019-12-01 DIAGNOSIS — L821 Other seborrheic keratosis: Secondary | ICD-10-CM | POA: Diagnosis not present

## 2019-12-01 DIAGNOSIS — D229 Melanocytic nevi, unspecified: Secondary | ICD-10-CM | POA: Diagnosis not present

## 2019-12-01 DIAGNOSIS — L578 Other skin changes due to chronic exposure to nonionizing radiation: Secondary | ICD-10-CM

## 2019-12-01 DIAGNOSIS — L820 Inflamed seborrheic keratosis: Secondary | ICD-10-CM | POA: Diagnosis not present

## 2019-12-01 DIAGNOSIS — Z1283 Encounter for screening for malignant neoplasm of skin: Secondary | ICD-10-CM | POA: Diagnosis not present

## 2019-12-01 DIAGNOSIS — D1800 Hemangioma unspecified site: Secondary | ICD-10-CM

## 2019-12-01 DIAGNOSIS — L814 Other melanin hyperpigmentation: Secondary | ICD-10-CM | POA: Diagnosis not present

## 2019-12-01 DIAGNOSIS — L570 Actinic keratosis: Secondary | ICD-10-CM

## 2019-12-01 NOTE — Progress Notes (Signed)
   New Patient Visit Referral from [facility], [place] is a 74 y.o. male who presents for the following: Annual Exam (New patient referred by [place] - TBSE taody - No history od skin cancer or abnormal moles.). The patient presents for Total-Body Skin Exam (TBSE) for skin cancer screening and mole check.  The following portions of the chart were reviewed this encounter and updated as appropriate:  Tobacco  Allergies  Meds  Problems  Med Hx  Surg Hx  Fam Hx     Review of Systems:  No other skin or systemic complaints except as noted in HPI or Assessment and Plan.  Objective  Well appearing patient in no apparent distress; mood and affect are within normal limits.  A full examination was performed including scalp, head, eyes, ears, nose, lips, neck, chest, axillae, abdomen, back, buttocks, bilateral upper extremities, bilateral lower extremities, hands, feet, fingers, toes, fingernails, and toenails. All findings within normal limits unless otherwise noted below.  Objective  Scalp (23): Erythematous thin papules/macules with gritty scale.   Objective  Scalp (4): Erythematous keratotic or waxy stuck-on papule or plaque.    Assessment & Plan    Lentigines - Scattered tan macules - Discussed due to sun exposure - Benign, observe - Call for any changes  Seborrheic Keratoses - Stuck-on, waxy, tan-brown papules and plaques  - Discussed benign etiology and prognosis. - Observe - Call for any changes  Melanocytic Nevi - Tan-brown and/or pink-flesh-colored symmetric macules and papules - Benign appearing on exam today - Observation - Call clinic for new or changing moles - Recommend daily use of broad spectrum spf 30+ sunscreen to sun-exposed areas.   Hemangiomas - Red papules - Discussed benign nature - Observe - Call for any changes  Actinic Damage - diffuse scaly erythematous macules with underlying dyspigmentation - Recommend  daily broad spectrum sunscreen SPF 30+ to sun-exposed areas, reapply every 2 hours as needed.  - Call for new or changing lesions.  Skin cancer screening performed today.  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  Destruction of lesion - Scalp Complexity: simple   Destruction method: cryotherapy   Informed consent: discussed and consent obtained   Timeout:  patient name, date of birth, surgical site, and procedure verified Lesion destroyed using liquid nitrogen: Yes   Region frozen until ice ball extended beyond lesion: Yes   Outcome: patient tolerated procedure well with no complications   Post-procedure details: wound care instructions given    Inflamed seborrheic keratosis (4) Scalp  Destruction of lesion - Scalp Complexity: simple   Destruction method: cryotherapy   Informed consent: discussed and consent obtained   Timeout:  patient name, date of birth, surgical site, and procedure verified Lesion destroyed using liquid nitrogen: Yes   Region frozen until ice ball extended beyond lesion: Yes   Outcome: patient tolerated procedure well with no complications   Post-procedure details: wound care instructions given    Return in about 2 months (around 01/31/2020) for AK follow up.  I, Ashok Cordia, CMA, am acting as scribe for Sarina Ser, MD .  Documentation: I have reviewed the above documentation for accuracy and completeness, and I agree with the above.  Sarina Ser, MD

## 2019-12-01 NOTE — Patient Instructions (Signed)

## 2019-12-03 ENCOUNTER — Other Ambulatory Visit: Payer: Self-pay | Admitting: Cardiovascular Disease

## 2019-12-03 DIAGNOSIS — I5022 Chronic systolic (congestive) heart failure: Secondary | ICD-10-CM

## 2019-12-03 DIAGNOSIS — I428 Other cardiomyopathies: Secondary | ICD-10-CM

## 2019-12-08 ENCOUNTER — Other Ambulatory Visit: Payer: Self-pay | Admitting: Cardiovascular Disease

## 2019-12-08 DIAGNOSIS — I472 Ventricular tachycardia, unspecified: Secondary | ICD-10-CM

## 2020-01-04 ENCOUNTER — Other Ambulatory Visit: Payer: Self-pay | Admitting: Cardiovascular Disease

## 2020-01-04 DIAGNOSIS — I5022 Chronic systolic (congestive) heart failure: Secondary | ICD-10-CM

## 2020-01-04 DIAGNOSIS — I428 Other cardiomyopathies: Secondary | ICD-10-CM

## 2020-01-04 NOTE — Progress Notes (Signed)
Cardiology Office Note  Date:  01/06/2020   ID:  Avid, Guillette January 03, 1946, MRN [id number]  PCP:  Valerie Roys, DO   Chief Complaint  Patient presents with  . other    12 month follow up. Meds reviewed by the pt. verbally. Pt. c/o shortness of breath with over exertion.     HPI:  [name] is a 74 yo male with PMH Of a "floppy heart"per the patient based off previous stress test though details unclear nonobstructive CAD by LHC in 04/2017   HFrEF with severe LV dysfunction secondary to NICM, symptomatic VT status post SJM dual-chamber ICD in 04/2017 Who presents for follow-up of his cardiomyopathy  Spionolactone: d/c Breast tenderness  Feels well, denies any leg swelling abdominal distention shortness of breath PND orthopnea  Some fatigue but manageable, denies sleep apnea  Followed by EP Notes reviewed, spironolactone held on their visit, request for repeat echo in 6 months He does not feel that he is moving backwards at all, feels stable Denies any orthostasis, Stays active but no regular exercise program  EKG personally reviewed by myself on todays visit Normal sinus rhythm rate 67 bpm left axis deviation  Discussed prior events 2019  had long runs of sustained VT, underlying sinus rhythm Clear A-V dissociation  transferred from our office to [facility] which showed no obstructive CAD,  LVEF was markedly reduced with LV gram estimate of EF of 25 to 35%.    Echo on 04/29/2017 showed an EF of 30 to 35%,   Cardiac MRI on 04/29/2017 showed severe LVE with diffuse hypokinesis with an EF of 27%,   [employer] dual-chamber ICD implantation on 04/29/2017.  Was discharged at the time on high-dose sotalol, this dose was reduced in half and follow-up   PMH:   has a past medical history of Arthritis, AV dissociation (04/28/2017), BPH (benign prostatic hyperplasia), Cataract, Coronary artery disease, non-occlusive, Diverticulosis (2017), Dysphagia  (12/20/2014), GERD (gastroesophageal reflux disease), HFrEF (heart failure with reduced ejection fraction) ([place]), Hypercholesterolemia, Varicose veins of both lower extremities (12/20/2014), and Ventricular tachycardia ([place]) (04/28/2017).  PSH:    Past Surgical History:  Procedure Laterality Date  . CARDIAC CATHETERIZATION  04/28/2017  . CATARACT EXTRACTION W/ INTRAOCULAR LENS IMPLANT Right 2017  . CATARACT EXTRACTION W/PHACO Left 05/20/2019   Procedure: CATARACT EXTRACTION PHACO AND INTRAOCULAR LENS PLACEMENT ([place]) LEFT;  Surgeon: Birder Robson, MD;  Location: [facility] ORS;  Service: Ophthalmology;  Laterality: Left;  [place] 01:19.7 CDE 14.58 Fluid Pack Lot # [id number]  . ESOPHAGOGASTRODUODENOSCOPY (EGD) WITH PROPOFOL N/A 02/19/2015   Procedure: ESOPHAGOGASTRODUODENOSCOPY (EGD) WITH PROPOFOL and esophageal dilation.;  Surgeon: Lucilla Lame, MD;  Location: [place];  Service: Endoscopy;  Laterality: N/A;  . EYE SURGERY    . ICD IMPLANT N/A 04/29/2017   Procedure: ICD IMPLANT;  Surgeon: Evans Lance, MD;  Location: [place] CV [facility];  Service: Cardiovascular;  Laterality: N/A;  . LEFT HEART CATH AND CORONARY ANGIOGRAPHY N/A 04/28/2017   Procedure: LEFT HEART CATH AND CORONARY ANGIOGRAPHY;  Surgeon: Martinique, Peter M, MD;  Location: [place] CV LAB;  Service: Cardiovascular;  Laterality: N/A;  . PROSTATE BIOPSY  ~ 2012  . RETINAL DETACHMENT SURGERY Right 1990s   "torn retina"    Current Outpatient Medications  Medication Sig Dispense Refill  . diclofenac Sodium (VOLTAREN) 1 % GEL Apply 4 g topically 4 (four) times daily. 100 g 12  . ENTRESTO 49-51 MG TAKE ONE TABLET BY MOUTH  TWICE DAILY 180 tablet 0  . finasteride (PROSCAR) 5 MG tablet Take 1 tablet (5 mg total) by mouth at bedtime. 90 tablet 3  . Multiple Vitamins-Minerals (MULTIVITAMIN ADULT PO) Take 1 tablet by mouth every morning.     [name] omeprazole (PRILOSEC) 20 MG capsule Take 1 capsule (20 mg total) by mouth daily. 90 capsule 3   . sotalol (BETAPACE) 80 MG tablet TAKE 1 TABLET TWICE DAILY 180 tablet 0  . tamsulosin (FLOMAX) 0.4 MG CAPS capsule Take 1 capsule (0.4 mg total) by mouth daily. 90 capsule 3   No current facility-administered medications for this visit.     Allergies:   Patient has no known allergies.   Social History:  The patient  reports that he has never smoked. He has never used smokeless tobacco. He reports current alcohol use of about 1.0 standard drink of alcohol per week. He reports that he does not use drugs.   Family History:   family history includes Arthritis in his mother; Cancer in his daughter; Diabetes in his father; Heart disease in his brother; Hyperlipidemia in his brother; Hypertension in his brother and father; Lung cancer in his daughter.    Review of Systems: Review of Systems  Constitutional: Negative.        Mild fatigue  HENT: Negative.   Respiratory: Negative.   Cardiovascular: Negative.   Gastrointestinal: Negative.   Musculoskeletal: Negative.   Neurological: Negative.   Psychiatric/Behavioral: Negative.   All other systems reviewed and are negative.   PHYSICAL EXAM: VS:  BP 130/72 (BP Location: Left Arm, Patient Position: Sitting, Cuff Size: Normal)   Pulse 67   Ht 5\' 10"  (1.778 m)   Wt 182 lb 4 oz (82.7 kg)   SpO2 98%   BMI 26.15 kg/m  , BMI Body mass index is 26.15 kg/m. Constitutional:  oriented to person, place, and time. No distress.  HENT:  Head: Grossly normal Eyes:  no discharge. No scleral icterus.  Neck: No JVD, no carotid bruits  Cardiovascular: Regular rate and rhythm, no murmurs appreciated Pulmonary/Chest: Clear to auscultation bilaterally, no wheezes or rails Abdominal: Soft.  no distension.  no tenderness.  Musculoskeletal: Normal range of motion Neurological:  normal muscle tone. Coordination normal. No atrophy Skin: Skin warm and dry Psychiatric: normal affect, pleasant   Recent Labs: 03/22/2019: Magnesium 2.3 08/29/2019: ALT 11;  BUN 22; Creatinine, Ser 1.13; Hemoglobin 15.3; Platelets 148; Potassium 4.7; Sodium 139; TSH 1.350    Lipid Panel Lab Results  Component Value Date   CHOL 165 08/29/2019   HDL 50 08/29/2019   LDLCALC 99 08/29/2019   TRIG 85 08/29/2019      Wt Readings from Last 3 Encounters:  01/06/20 182 lb 4 oz (82.7 kg)  09/20/19 182 lb (82.6 kg)  08/29/19 184 lb 9.6 oz (83.7 kg)      ASSESSMENT AND PLAN:   VT (ventricular tachycardia) (HCC) on sotalol ICD in place, followed by Dr. Janine Ores reviewed with him on today's visit  Dilated cardiomyopathy Off spironolactone secondary to breast tenderness Continue Entresto up to 49/51 mg p.o. twice daily Discussed high co-pay with him, very strategies available Discussed even higher dose Entresto, given fatigue and periodic low pressures at home, no medication changes made Continue sotalol EP had recommended repeat echocardiogram, we will schedule for January 2022 prior to visit with EP March 20, 2020   Total encounter time more than 25 minutes  Greater than 50% was spent in counseling and coordination of care with  the patient   Orders Placed This Encounter  Procedures  . EKG 12-Lead     Signed, Esmond Plants, M.D., Ph.D. 01/06/2020  [place], [place]

## 2020-01-06 ENCOUNTER — Ambulatory Visit: Payer: Medicare HMO | Admitting: Cardiovascular Disease

## 2020-01-06 ENCOUNTER — Other Ambulatory Visit: Payer: Self-pay

## 2020-01-06 ENCOUNTER — Encounter: Payer: Self-pay | Admitting: Cardiovascular Disease

## 2020-01-06 VITALS — BP 130/72 | HR 67 | Ht 70.0 in | Wt 182.2 lb

## 2020-01-06 DIAGNOSIS — I428 Other cardiomyopathies: Secondary | ICD-10-CM

## 2020-01-06 DIAGNOSIS — I420 Dilated cardiomyopathy: Secondary | ICD-10-CM

## 2020-01-06 DIAGNOSIS — Z95810 Presence of automatic (implantable) cardiac defibrillator: Secondary | ICD-10-CM

## 2020-01-06 DIAGNOSIS — I951 Orthostatic hypotension: Secondary | ICD-10-CM

## 2020-01-06 DIAGNOSIS — I472 Ventricular tachycardia, unspecified: Secondary | ICD-10-CM

## 2020-01-06 DIAGNOSIS — I5022 Chronic systolic (congestive) heart failure: Secondary | ICD-10-CM

## 2020-01-06 MED ORDER — SOTALOL HCL 80 MG PO TABS: 80.0000 mg | ORAL_TABLET | Freq: Two times a day (BID) | ORAL | 3 refills | Status: DC

## 2020-01-06 NOTE — Patient Instructions (Addendum)
Medication Instructions:  No changes  If you need a refill on your cardiac medications before your next appointment, please call your pharmacy.    Lab work: No new labs needed   If you have labs (blood work) drawn today and your tests are completely normal, you will receive your results only by: [name] MyChart Message (if you have MyChart) OR . A paper copy in the mail If you have any lab test that is abnormal or we need to change your treatment, we will call you to review the results.   Testing/Procedures: Echo for cardiomyopathy very end of dec or beginning of Jan 2022 Before Jan 11th 2022  Your physician has requested that you have an echocardiogram. Echocardiography is a painless test that uses sound waves to create images of your heart. It provides your doctor with information about the size and shape of your heart and how well your heart's chambers and valves are working. This procedure takes approximately one hour. There are no restrictions for this procedure.    Follow-Up: At [facility], you and your health needs are our priority.  As part of our continuing mission to provide you with exceptional heart care, we have created designated Provider Care Teams.  These Care Teams include your primary Cardiologist (physician) and Advanced Practice Providers (APPs -  Physician Assistants and Nurse Practitioners) who all work together to provide you with the care you need, when you need it.  . You will need a follow up appointment in 12 months  . Providers on your designated Care Team:   . Murray Hodgkins, NP . Christell Faith, PA-C . Marrianne Mood, PA-C  Any Other Special Instructions Will Be Listed Below (If Applicable).  COVID-19 Vaccine Information can be found at: ShippingScam.co.uk For questions related to vaccine distribution or appointments, please email [email or web address] or call [phone].

## 2020-02-09 ENCOUNTER — Other Ambulatory Visit: Payer: Self-pay

## 2020-02-09 ENCOUNTER — Ambulatory Visit: Payer: Medicare HMO | Admitting: Dermatology

## 2020-02-09 DIAGNOSIS — L821 Other seborrheic keratosis: Secondary | ICD-10-CM | POA: Diagnosis not present

## 2020-02-09 DIAGNOSIS — L570 Actinic keratosis: Secondary | ICD-10-CM | POA: Diagnosis not present

## 2020-02-09 DIAGNOSIS — L578 Other skin changes due to chronic exposure to nonionizing radiation: Secondary | ICD-10-CM

## 2020-02-09 DIAGNOSIS — L820 Inflamed seborrheic keratosis: Secondary | ICD-10-CM | POA: Diagnosis not present

## 2020-02-09 NOTE — Patient Instructions (Signed)

## 2020-02-09 NOTE — Progress Notes (Signed)
Follow-Up Visit   Subjective  Leroy Merritt is a 74 y.o. male who presents for the following: Actinic Keratosis (3 months f/u Aks treated on scalp with LN2 ). Pt c/o irritated itchy  areas on the scalp he would like treated today.  The following portions of the chart were reviewed this encounter and updated as appropriate:  Tobacco  Allergies  Meds  Problems  Med Hx  Surg Hx  Fam Hx     Review of Systems:  No other skin or systemic complaints except as noted in HPI or Assessment and Plan.  Objective  Well appearing patient in no apparent distress; mood and affect are within normal limits.  A focused examination was performed including scalp . Relevant physical exam findings are noted in the Assessment and Plan.  Objective  Scalp (16): Erythematous thin papules/macules with gritty scale.   Objective  scalp (7): Erythematous keratotic or waxy stuck-on papule or plaque.    Assessment & Plan  AK (actinic keratosis) (16) Scalp  Destruction of lesion - Scalp Complexity: simple   Destruction method: cryotherapy   Informed consent: discussed and consent obtained   Timeout:  patient name, date of birth, surgical site, and procedure verified Lesion destroyed using liquid nitrogen: Yes   Region frozen until ice ball extended beyond lesion: Yes   Outcome: patient tolerated procedure well with no complications   Post-procedure details: wound care instructions given    Inflamed seborrheic keratosis (7) scalp  Destruction of lesion - scalp Complexity: simple   Destruction method: cryotherapy   Informed consent: discussed and consent obtained   Timeout:  patient name, date of birth, surgical site, and procedure verified Lesion destroyed using liquid nitrogen: Yes   Region frozen until ice ball extended beyond lesion: Yes   Outcome: patient tolerated procedure well with no complications   Post-procedure details: wound care instructions given     Severe, 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  The purpose is to decrease the number of clinically evident and subclinical PreCancerous lesions to prevent progression to development of skin cancer by chemically destroying early precancer changes that may or may not be visible.  It has been shown to reduce the risk of developing skin cancer in the treated area. As a result of treatment, redness, scaling, crusting, and open sores may occur during treatment course. One or more than one of these methods may be used and may have to be used several times to control, suppress and eliminate the PreCancerous changes. Discussed treatment course, expected reaction, and possible side effects. Plan PDT of scalp in 2 wks.  Actinic Damage - chronic, secondary to cumulative UV radiation exposure/sun exposure over time - diffuse scaly erythematous macules with underlying dyspigmentation - Recommend daily broad spectrum sunscreen SPF 30+ to sun-exposed areas, reapply every 2 hours as needed.  - Call for new or changing lesions.  Seborrheic Keratoses - Stuck-on, waxy, tan-brown papules and plaques  - Discussed benign etiology and prognosis. - Observe - Call for any changes  Return in about 2 weeks (around 02/23/2020) for PDT on scalp in 2 weeks .  I, Marye Round, CMA, am acting as Education administrator for [employer]  Nehemiah Massed, MD .  Documentation: I have reviewed the above documentation for accuracy and completeness, and I agree with the above.  Sarina Ser, MD

## 2020-02-15 ENCOUNTER — Encounter: Payer: Self-pay | Admitting: Dermatology

## 2020-02-17 ENCOUNTER — Ambulatory Visit (INDEPENDENT_AMBULATORY_CARE_PROVIDER_SITE_OTHER): Payer: Medicare HMO

## 2020-02-17 DIAGNOSIS — I420 Dilated cardiomyopathy: Secondary | ICD-10-CM | POA: Diagnosis not present

## 2020-02-18 LAB — CUP PACEART REMOTE DEVICE CHECK
Battery Remaining Longevity: 61 mo
Battery Remaining Percentage: 71 %
Battery Voltage: 2.96 V
Brady Statistic AP VP Percent: 28 %
Brady Statistic AP VS Percent: 25 %
Brady Statistic AS VP Percent: 4.5 %
Brady Statistic AS VS Percent: 35 %
Brady Statistic RA Percent Paced: 46 %
Brady Statistic RV Percent Paced: 33 %
Date Time Interrogation Session: 20211210044306
HighPow Impedance: 73 Ohm
HighPow Impedance: 73 Ohm
Implantable Lead Implant Date: 20190220
Implantable Lead Implant Date: 20190220
Implantable Lead Location: 753859
Implantable Lead Location: 753860
Implantable Lead Model: 7122
Implantable Pulse Generator Implant Date: 20190220
Lead Channel Impedance Value: 350 Ohm
Lead Channel Impedance Value: 440 Ohm
Lead Channel Pacing Threshold Amplitude: 0.75 V
Lead Channel Pacing Threshold Amplitude: 1 V
Lead Channel Pacing Threshold Pulse Width: 0.5 ms
Lead Channel Pacing Threshold Pulse Width: 0.5 ms
Lead Channel Sensing Intrinsic Amplitude: 1.4 mV
Lead Channel Sensing Intrinsic Amplitude: 11.8 mV
Lead Channel Setting Pacing Amplitude: 2 V
Lead Channel Setting Pacing Amplitude: 2.5 V
Lead Channel Setting Pacing Pulse Width: 0.5 ms
Lead Channel Setting Sensing Sensitivity: 0.5 mV
Pulse Gen Serial Number: 9787528

## 2020-02-23 ENCOUNTER — Ambulatory Visit: Payer: Medicare HMO

## 2020-02-29 NOTE — Progress Notes (Signed)
Remote ICD transmission.   

## 2020-03-13 ENCOUNTER — Other Ambulatory Visit: Payer: Self-pay

## 2020-03-13 ENCOUNTER — Ambulatory Visit (INDEPENDENT_AMBULATORY_CARE_PROVIDER_SITE_OTHER): Payer: Medicare HMO

## 2020-03-13 DIAGNOSIS — I420 Dilated cardiomyopathy: Secondary | ICD-10-CM

## 2020-03-13 DIAGNOSIS — I77810 Thoracic aortic ectasia: Secondary | ICD-10-CM

## 2020-03-13 HISTORY — DX: Thoracic aortic ectasia: I77.810

## 2020-03-13 LAB — ECHOCARDIOGRAM COMPLETE
AR max vel: 2.25 cm2
AV Area VTI: 2.15 cm2
AV Area mean vel: 2.18 cm2
AV Mean grad: 3 mmHg
AV Peak grad: 4.4 mmHg
Ao pk vel: 1.05 m/s
Area-P 1/2: 2.69 cm2
Calc EF: 43.7 %
S' Lateral: 3.7 cm
Single Plane A2C EF: 39.4 %
Single Plane A4C EF: 46.8 %

## 2020-03-15 ENCOUNTER — Other Ambulatory Visit: Payer: Self-pay | Admitting: *Deleted

## 2020-03-19 ENCOUNTER — Telehealth: Payer: Self-pay

## 2020-03-19 NOTE — Telephone Encounter (Signed)
Able to reach pt regarding his recent  echo, Dr. Rockey Situ had a chance to review his results and advised   "Mild decrease in ejection fraction compared to prior study"  Pt stated he is off the spironolactone and still taking the [facility], has an appt tomorrow 1/11 with EP, can review more in details or results then, Pt verbalized understanding, otherwise all questions or concerns were address and no additional concerns at this time.

## 2020-03-19 NOTE — Progress Notes (Signed)
Patient Care Team: Valerie Roys, DO as PCP - General (Family Medicine) [name], MD as Consulting Physician (Cardiology) Deboraha Sprang, MD as Consulting Physician (Cardiology)   HPI  Leroy Merritt is a 75 y.o. 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  Sotalol dose was decreased from 160--80; also started on transition to [facility] and addition of spironolactone- off aldactone 2/2 gynecomastia   The patient denies chest pain, nocturnal dyspnea, orthopnea or peripheral edema.  There have been no palpitations, lightheadedness or syncope . Modest Dyspnea with exertion, walking inclines, mowing yard--no change over recent months      DATE TEST EF   2/19 cMRI   27 % Mild LGE   RVE but w/o RV anuerysm  2/19 Echo   30-35 %   2/19 LHC 25-30% No obst CAD  1/21 Echo  50-55%   1/22 Echo  40-45%    Date Cr K Mg Hgb  2/19 1.11 4.2      6/21 1.13 4.7 2.3 (1/21) 15.3      Records and Results Reviewed   Past Medical History:  Diagnosis Date  . Arthritis   . 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 BPH (benign prostatic hyperplasia)   . Cataract   . Coronary artery disease, non-occlusive   . Diverticulosis 2017  . Dysphagia 12/20/2014  . GERD (gastroesophageal reflux disease)   . HFrEF (heart failure with reduced ejection fraction) ([place])   . Hypercholesterolemia   . 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 Ventricular tachycardia ([place]) 04/28/2017   a.  Status post SJM dual-chamber ICD in 04/2017    Past Surgical History:  Procedure Laterality Date  . CARDIAC CATHETERIZATION  04/28/2017  . CATARACT EXTRACTION W/ INTRAOCULAR LENS IMPLANT Right 2017  . CATARACT EXTRACTION W/PHACO Left 05/20/2019   Procedure: CATARACT EXTRACTION PHACO AND INTRAOCULAR LENS PLACEMENT ([place]) LEFT;   Surgeon: Birder Robson, MD;  Location: [facility] ORS;  Service: Ophthalmology;  Laterality: Left;  [place] 01:19.7 CDE 14.58 Fluid Pack Lot # [id number]  . ESOPHAGOGASTRODUODENOSCOPY (EGD) WITH PROPOFOL N/A 02/19/2015   Procedure: ESOPHAGOGASTRODUODENOSCOPY (EGD) WITH PROPOFOL and esophageal dilation.;  Surgeon: Lucilla Lame, MD;  Location: [place];  Service: Endoscopy;  Laterality: N/A;  . EYE SURGERY    . ICD IMPLANT N/A 04/29/2017   Procedure: ICD IMPLANT;  Surgeon: Evans Lance, MD;  Location: [place] [facility];  Service: Cardiovascular;  Laterality: N/A;  . LEFT HEART CATH AND CORONARY ANGIOGRAPHY N/A 04/28/2017   Procedure: LEFT HEART CATH AND CORONARY ANGIOGRAPHY;  Surgeon: Martinique, Peter M, MD;  Location: [place] [facility];  Service: Cardiovascular;  Laterality: N/A;  . PROSTATE BIOPSY  ~ 2012  . RETINAL DETACHMENT SURGERY Right 1990s   "torn retina"    Current Meds  Medication Sig  . ENTRESTO 49-51 MG TAKE ONE TABLET BY MOUTH TWICE DAILY  . finasteride (PROSCAR) 5 MG tablet Take 1 tablet (5 mg total) by mouth at bedtime.  . Multiple Vitamins-Minerals (MULTIVITAMIN ADULT PO) Take 1 tablet by mouth every morning.   [name] omeprazole (PRILOSEC) 20 MG capsule Take 1 capsule (20 mg total) by mouth daily.  . sotalol (BETAPACE) 80 MG tablet Take 1 tablet (80 mg total) by mouth 2 (two) times daily.  . tamsulosin (FLOMAX)  0.4 MG CAPS capsule Take 1 capsule (0.4 mg total) by mouth daily.    No Known Allergies    Review of Systems negative except from HPI and PMH  Physical Exam BP (!) 142/98 (BP Location: Left Arm, Patient Position: Sitting, Cuff Size: Normal)   Pulse 66   Ht 5\' 10"  (1.778 m)   Wt 184 lb 2 oz (83.5 kg)   SpO2 96%   BMI 26.42 kg/m  Well developed and well nourished in no acute distress HENT normal Neck supple with JVP-flat Clear Device pocket well healed; without hematoma or erythema.  There is no tethering  Regular rate and rhythm, no   murmur Abd-soft  with active BS No Clubbing cyanosis edema Skin-warm and dry A & Oriented  Grossly normal sensory and motor function  ECG sinus @ 66 29/08/40 *    Assessment and  Plan  Nonischemic cardiomyopathy interval worsening  Ventricular tachycardia  Implantable defibrillator-secondary prevention  St Jude   Orthostatic intolerance  High Risk Medication Surveillance sotalol., aldactone  Fatigue  First-degree AV block  PVCs frequent   Freq PVCs with some closely coupled, and identified by the PM and others late couples and not, but notable on histogram plots with an overally precentage of about 12+ %  Has had for some time  No intercurrent Ventricular tachycardia  We had stopped the aldactone 2/2 gynecomastia and there has been interval worsening of LV function, perhaps related to withdrawal, perhaps to PVCs or ... Discussed the potential of epleronone to give [place] benefits without the gynecomastia  Agreeable to trial Will need BMET in 2-3 weeks for K and will check BNP at same time given his dyspnea--no evidence of volume on exam, and diet is salt deplete  Dyspnea may also be related to AV dyssynchrony given long AV block, and this might be evident of GXT  Sotalol surveillance labs at 2 week blood draw ( include Mg)   If dyspnea does not improve, will assess by monitor PVC burden and consider antiarrhythmic therapy, either drug or ablation        Current medicines are reviewed at length with the patient today .  The patient does not  have concerns regarding medicines.

## 2020-03-20 ENCOUNTER — Other Ambulatory Visit: Payer: Self-pay

## 2020-03-20 ENCOUNTER — Ambulatory Visit (INDEPENDENT_AMBULATORY_CARE_PROVIDER_SITE_OTHER): Payer: Medicare HMO | Admitting: Internal Medicine

## 2020-03-20 ENCOUNTER — Encounter: Payer: Self-pay | Admitting: Internal Medicine

## 2020-03-20 VITALS — BP 142/98 | HR 66 | Ht 70.0 in | Wt 184.1 lb

## 2020-03-20 DIAGNOSIS — I472 Ventricular tachycardia, unspecified: Secondary | ICD-10-CM

## 2020-03-20 DIAGNOSIS — R0602 Shortness of breath: Secondary | ICD-10-CM

## 2020-03-20 DIAGNOSIS — I428 Other cardiomyopathies: Secondary | ICD-10-CM | POA: Diagnosis not present

## 2020-03-20 DIAGNOSIS — I420 Dilated cardiomyopathy: Secondary | ICD-10-CM | POA: Diagnosis not present

## 2020-03-20 DIAGNOSIS — I5022 Chronic systolic (congestive) heart failure: Secondary | ICD-10-CM | POA: Diagnosis not present

## 2020-03-20 DIAGNOSIS — Z95810 Presence of automatic (implantable) cardiac defibrillator: Secondary | ICD-10-CM | POA: Diagnosis not present

## 2020-03-20 DIAGNOSIS — I440 Atrioventricular block, first degree: Secondary | ICD-10-CM | POA: Diagnosis not present

## 2020-03-20 LAB — CUP PACEART INCLINIC DEVICE CHECK
Battery Remaining Longevity: 62 mo
Brady Statistic RA Percent Paced: 46 %
Brady Statistic RV Percent Paced: 33 %
Date Time Interrogation Session: 20220111104056
HighPow Impedance: 75.375
Implantable Lead Implant Date: 20190220
Implantable Lead Implant Date: 20190220
Implantable Lead Location: 753859
Implantable Lead Location: 753860
Implantable Lead Model: 7122
Implantable Pulse Generator Implant Date: 20190220
Lead Channel Impedance Value: 375 Ohm
Lead Channel Impedance Value: 450 Ohm
Lead Channel Pacing Threshold Amplitude: 0.75 V
Lead Channel Pacing Threshold Amplitude: 0.75 V
Lead Channel Pacing Threshold Amplitude: 1 V
Lead Channel Pacing Threshold Amplitude: 1 V
Lead Channel Pacing Threshold Pulse Width: 0.5 ms
Lead Channel Pacing Threshold Pulse Width: 0.5 ms
Lead Channel Pacing Threshold Pulse Width: 0.5 ms
Lead Channel Pacing Threshold Pulse Width: 0.5 ms
Lead Channel Sensing Intrinsic Amplitude: 0.8 mV
Lead Channel Sensing Intrinsic Amplitude: 11.8 mV
Lead Channel Setting Pacing Amplitude: 2 V
Lead Channel Setting Pacing Amplitude: 2.5 V
Lead Channel Setting Pacing Pulse Width: 0.5 ms
Lead Channel Setting Sensing Sensitivity: 0.5 mV
Pulse Gen Serial Number: 9787528

## 2020-03-20 LAB — PACEMAKER DEVICE OBSERVATION

## 2020-03-20 MED ORDER — EPLERENONE 25 MG PO TABS: 25.0000 mg | ORAL_TABLET | Freq: Every day | ORAL | 1 refills | Status: DC

## 2020-03-20 NOTE — Patient Instructions (Addendum)
Medication Instructions:  - Your physician has recommended you make the following change in your medication:   1) START eplerenone 25 mg- take 1 tablet by mouth once daily   *If you need a refill on your cardiac medications before your next appointment, please call your pharmacy*   Lab Work: - Your physician recommends that you return for lab work : 2 weeks after starting the eplerenone- BMP/ [place] [facility] at [facility] 1st desk on the right to check in, past the screening table Lab hours: Monday- Friday (7:30 am- 5:30 pm)  If you have labs (blood work) drawn today and your tests are completely normal, you will receive your results only by: [name] MyChart Message (if you have MyChart) OR . A paper copy in the mail If you have any lab test that is abnormal or we need to change your treatment, we will call you to review the results.   Testing/Procedures: - none ordered   Follow-Up: At [facility], you and your health needs are our priority.  As part of our continuing mission to provide you with exceptional heart care, we have created designated Provider Care Teams.  These Care Teams include your primary Cardiologist (physician) and Advanced Practice Providers (APPs -  Physician Assistants and Nurse Practitioners) who all work together to provide you with the care you need, when you need it.  We recommend signing up for the patient portal called "MyChart".  Sign up information is provided on this After Visit Summary.  MyChart is used to connect with patients for Virtual Visits (Telemedicine).  Patients are able to view lab/test results, encounter notes, upcoming appointments, etc.  Non-urgent messages can be sent to your provider as well.   To learn more about what you can do with MyChart, go to [email or web address].    Your next appointment:   10-12  week(s)  The format for your next appointment:   In Person  Provider:   Virl Axe, MD   Other  Instructions  Eplerenone Oral Tablets What is this medicine? EPLERENONE (e PLER en one) is a diuretic. It helps you make more urine and to lose salt and excess water from your body. It treats high blood pressure and heart failure. It can treat heart damage after a heart attack. This medicine may be used for other purposes; ask your health care provider or pharmacist if you have questions. COMMON BRAND NAME(S): Inspra What should I tell my health care provider before I take this medicine? They need to know if you have any of these conditions:  Addison's disease or low adrenal gland function  diabetes (high blood sugar)  high levels of potassium in the blood  if you are on a special diet, such as a low-salt diet and are using dietary salt substitutes  kidney disease  liver disease  an unusual or allergic reaction to eplerenone, other medicines, foods, dyes, or preservatives  pregnant or trying to get pregnant  breast-feeding How should I use this medicine? Take this drug by mouth. Take it as directed on the prescription label at the same time every day. You can take it with or without food. If it upsets your stomach, take it with food. Keep taking it unless your health care provider tells you to stop. Do not take this drug with grapefruit juice. Talk to your pediatrician regarding the use of this drug in children. Special care may be needed. Overdosage: If you think you have taken too much of this medicine  contact a poison control center or emergency room at once. NOTE: This medicine is only for you. Do not share this medicine with others. What if I miss a dose? If you miss a dose, take it as soon as you can. If it is almost time for your next dose, take only that dose. Do not take double or extra doses. What may interact with this medicine? Do not take this medicine with any of the following medications:  boceprevir  ceritinib  certain antibiotics like chloramphenicol,  clarithromycin, dalfopristin; quinupristin, and telithromycin  certain diuretics like amiloride, spironolactone, and triamterene  certain medicines for fungal infections like itraconazole, ketoconazole, posaconazole, and voriconazole  certain medicines for HIV or AIDS like atazanavir, cobicistat, darunavir, delavirdine, fosamprenavir, indinavir, nelfinavir, ritonavir, saquinavir boosted with ritonavir, and tipranavir  grapefruit and grapefruit juice  ibritumomab tiuxetan  idelalisib  lonafarnib  mifepristone  nefazodone  potassium salts or supplements  ribociclib  tucatinib This medicine may also interact with the following medications:  certain medicines for high blood pressure like enalapril, candesartan, lisinopril, and valsartan  erythromycin  fluconazole  lithium  NSAIDs, medicines for pain and inflammation, like ibuprofen or naproxen  verapamil This list may not describe all possible interactions. Give your health care provider a list of all the medicines, herbs, non-prescription drugs, or dietary supplements you use. Also tell them if you smoke, drink alcohol, or use illegal drugs. Some items may interact with your medicine. What should I watch for while using this medicine? Visit your doctor or health care provider for regular checks on your progress. Check your blood pressure as directed. Ask your health care provider what your blood pressure should be. Also, find out when you should contact him or her. Do not treat yourself for coughs, colds, or pain while you are using this medicine without asking your health care provider for advice. Some medicines may increase your blood pressure. Check with your health care provider if you have severe diarrhea, nausea, and vomiting, or if you sweat a lot. The loss of too much body fluid may make it dangerous for you to take this medicine. You may need to be on a special diet while taking this medicine. Ask your health care  provider. Also, find out how many glasses of fluid you need to drink each day. You may get drowsy or dizzy. Do not drive, use machinery, or do anything that needs mental alertness until you know how this medicine affects you. Do not stand or sit up quickly, especially if you are an older patient. This reduces the risk of dizzy or fainting spells. Alcohol may interfere with the effects of this medicine. Avoid alcoholic drinks. Avoid salt substitutes unless you are told otherwise by your health care provider. What side effects may I notice from receiving this medicine? Side effects that you should report to your health care provider as soon as possible:  allergic reactions (skin rash, itching or hives, swelling of the face, lips, or tongue)  breast enlargement in both males and females  changes in menstrual cycle  high potassium levels (chest pain; fast, irregular heartbeat; muscle weakness)  low blood pressure (dizziness; feeling faint or lightheaded, falls; unusually weak or tired) Side effects that usually do not require medical attention (report to your health care provider if they continue or are bothersome):  changes in sex drive or performance  cough  diarrhea  fatigue  headache  stomach pain This list may not describe all possible side effects. Call your doctor  for medical advice about side effects. You may report side effects to FDA at 1-800-FDA-1088. Where should I keep my medicine? Keep out of the reach of children and pets. Store at room temperature at 25 degrees C (77 degrees F). Get rid of any unused medicine after the expiration date. To get rid of medicines that are no longer needed or have expired:  Take the medicine to a medicine take-back program. Check with your pharmacy or law enforcement to find a location.  If you cannot return the medicine, check the label or package insert to see if the medicine should be thrown out in the garbage or flushed down the toilet.  If you are not sure, ask your health care provider. If it is safe to put into the trash, take the medicine out of the container. Mix the medicine with cat litter, dirt, coffee grounds, or other unwanted substance. Seal the mixture in a bag or container. Put it in the trash. NOTE: This sheet is a summary. It may not cover all possible information. If you have questions about this medicine, talk to your doctor, pharmacist, or health care provider.  2021 Elsevier/Gold Standard (2019-11-11 12:24:17)

## 2020-03-21 ENCOUNTER — Telehealth: Payer: Self-pay | Admitting: Internal Medicine

## 2020-03-21 ENCOUNTER — Other Ambulatory Visit: Payer: Self-pay | Admitting: *Deleted

## 2020-03-21 MED ORDER — EPLERENONE 25 MG PO TABS: 25.0000 mg | ORAL_TABLET | Freq: Every day | ORAL | 3 refills | Status: DC

## 2020-03-21 NOTE — Telephone Encounter (Signed)
Requested Prescriptions   Signed Prescriptions Disp Refills  . eplerenone (INSPRA) 25 MG tablet 30 tablet 3    Sig: Take 1 tablet (25 mg total) by mouth daily.    Authorizing Provider: Deboraha Sprang    Ordering User: Britt Bottom

## 2020-03-21 NOTE — Telephone Encounter (Signed)
Requested Prescriptions   Signed Prescriptions Disp Refills  . eplerenone (INSPRA) 25 MG tablet 30 tablet 3    Sig: Take 1 tablet (25 mg total) by mouth daily.    Authorizing Provider: KLEIN, STEVEN C    Ordering User: Rawad Bochicchio C    

## 2020-03-21 NOTE — Telephone Encounter (Signed)
°*  STAT* If patient is at the pharmacy, call can be transferred to refill team.   1. Which medications need to be refilled? (please list name of each medication and dose if known) eplerenone 25 mg  2. Which pharmacy/location (including street and city if local pharmacy) is medication to be sent to? [place]  3. Do they need a 30 day or 90 day supply? 30  Already called in to [facility] but patient does not want 90 days at this time, patient will call and cancel that order

## 2020-04-04 ENCOUNTER — Other Ambulatory Visit: Payer: Self-pay

## 2020-04-04 ENCOUNTER — Other Ambulatory Visit
Admission: RE | Admit: 2020-04-04 | Discharge: 2020-04-04 | Disposition: A | Discharge status: Home / Self Care | Payer: Medicare HMO | Attending: Internal Medicine | Admitting: Internal Medicine

## 2020-04-04 DIAGNOSIS — R0602 Shortness of breath: Secondary | ICD-10-CM | POA: Insufficient documentation

## 2020-04-04 DIAGNOSIS — I5022 Chronic systolic (congestive) heart failure: Secondary | ICD-10-CM | POA: Insufficient documentation

## 2020-04-04 DIAGNOSIS — I428 Other cardiomyopathies: Secondary | ICD-10-CM | POA: Insufficient documentation

## 2020-04-04 LAB — BASIC METABOLIC PANEL
Anion gap: 11 (ref 5–15)
BUN: 21 mg/dL (ref 8–23)
CO2: 26 mmol/L (ref 22–32)
Calcium: 9.5 mg/dL (ref 8.9–10.3)
Chloride: 104 mmol/L (ref 98–111)
Creatinine, Ser: 1.29 mg/dL — ABNORMAL HIGH (ref 0.61–1.24)
GFR, Estimated: 57 mL/min — ABNORMAL LOW (ref >60)
Glucose, Bld: 77 mg/dL (ref 70–99)
Potassium: 4.6 mmol/L (ref 3.5–5.1)
Sodium: 141 mmol/L (ref 135–145)

## 2020-04-04 LAB — BRAIN NATRIURETIC PEPTIDE: B Natriuretic Peptide: 44.5 pg/mL (ref 0.0–100.0)

## 2020-04-05 ENCOUNTER — Other Ambulatory Visit: Payer: Self-pay | Admitting: Cardiovascular Disease

## 2020-04-05 DIAGNOSIS — I5022 Chronic systolic (congestive) heart failure: Secondary | ICD-10-CM

## 2020-04-05 DIAGNOSIS — I428 Other cardiomyopathies: Secondary | ICD-10-CM

## 2020-05-24 LAB — CUP PACEART REMOTE DEVICE CHECK
Battery Remaining Longevity: 60 mo
Battery Remaining Percentage: 68 %
Battery Voltage: 2.96 V
Brady Statistic AP VP Percent: 22 %
Brady Statistic AP VS Percent: 14 %
Brady Statistic AS VP Percent: 4.7 %
Brady Statistic AS VS Percent: 57 %
Brady Statistic RA Percent Paced: 33 %
Brady Statistic RV Percent Paced: 27 %
Date Time Interrogation Session: 20220315001712
HighPow Impedance: 73 Ohm
HighPow Impedance: 73 Ohm
Implantable Lead Implant Date: 20190220
Implantable Lead Implant Date: 20190220
Implantable Lead Location: 753859
Implantable Lead Location: 753860
Implantable Lead Model: 7122
Implantable Pulse Generator Implant Date: 20190220
Lead Channel Impedance Value: 350 Ohm
Lead Channel Impedance Value: 430 Ohm
Lead Channel Pacing Threshold Amplitude: 0.75 V
Lead Channel Pacing Threshold Amplitude: 1 V
Lead Channel Pacing Threshold Pulse Width: 0.5 ms
Lead Channel Pacing Threshold Pulse Width: 0.5 ms
Lead Channel Sensing Intrinsic Amplitude: 1.2 mV
Lead Channel Sensing Intrinsic Amplitude: 11.8 mV
Lead Channel Setting Pacing Amplitude: 2 V
Lead Channel Setting Pacing Amplitude: 2.5 V
Lead Channel Setting Pacing Pulse Width: 0.5 ms
Lead Channel Setting Sensing Sensitivity: 0.5 mV
Pulse Gen Serial Number: 9787528

## 2020-05-25 ENCOUNTER — Ambulatory Visit (INDEPENDENT_AMBULATORY_CARE_PROVIDER_SITE_OTHER): Payer: Medicare HMO

## 2020-05-25 DIAGNOSIS — I440 Atrioventricular block, first degree: Secondary | ICD-10-CM

## 2020-05-31 NOTE — Progress Notes (Signed)
Remote ICD transmission.   

## 2020-06-12 ENCOUNTER — Other Ambulatory Visit: Payer: Self-pay

## 2020-06-12 ENCOUNTER — Encounter: Payer: Self-pay | Admitting: Internal Medicine

## 2020-06-12 ENCOUNTER — Ambulatory Visit (INDEPENDENT_AMBULATORY_CARE_PROVIDER_SITE_OTHER): Payer: Medicare HMO | Admitting: Internal Medicine

## 2020-06-12 VITALS — BP 120/78 | HR 64 | Ht 70.0 in | Wt 183.0 lb

## 2020-06-12 DIAGNOSIS — Z79899 Other long term (current) drug therapy: Secondary | ICD-10-CM | POA: Diagnosis not present

## 2020-06-12 DIAGNOSIS — I5022 Chronic systolic (congestive) heart failure: Secondary | ICD-10-CM

## 2020-06-12 DIAGNOSIS — I472 Ventricular tachycardia, unspecified: Secondary | ICD-10-CM

## 2020-06-12 DIAGNOSIS — I428 Other cardiomyopathies: Secondary | ICD-10-CM | POA: Diagnosis not present

## 2020-06-12 DIAGNOSIS — Z95810 Presence of automatic (implantable) cardiac defibrillator: Secondary | ICD-10-CM

## 2020-06-12 DIAGNOSIS — I440 Atrioventricular block, first degree: Secondary | ICD-10-CM | POA: Diagnosis not present

## 2020-06-12 DIAGNOSIS — I420 Dilated cardiomyopathy: Secondary | ICD-10-CM | POA: Diagnosis not present

## 2020-06-12 LAB — PACEMAKER DEVICE OBSERVATION

## 2020-06-12 MED ORDER — DAPAGLIFLOZIN PROPANEDIOL 10 MG PO TABS: 10.0000 mg | ORAL_TABLET | Freq: Every day | ORAL | 6 refills | Status: DC

## 2020-06-12 NOTE — Progress Notes (Signed)
Patient Care Team: Valerie Roys, DO as PCP - General (Family Medicine) [name], MD as Consulting Physician (Cardiology) Deboraha Sprang, MD as Consulting Physician (Cardiology)   HPI  Leroy Merritt is a 75 y.o. 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  Sotalol dose was decreased from 160--80; also started on transition to [facility] and addition of spironolactone- off aldactone 2/2 gynecomastia and now on eplerenone  The patient denies chest pain, nocturnal dysp, orthopnea or peripheral edema.  There have been no palpitations, lightheadedness or syncope.  Dyspnea on exertion.    DATE TEST EF   2/19 cMRI   27 % Mild LGE   RVE but w/o RV anuerysm  2/19 Echo   30-35 %   2/19 LHC 25-30% No obst CAD  1/21 Echo  50-55%   1/22 Echo  40-45%    Date Cr K Mg Hgb  2/19 1.11 4.2      6/21 1.13 4.7 2.3 (1/21) 15.3  1/22 1.29 4.6  15.3 (6/21)            Records and Results Reviewed   Past Medical History:  Diagnosis Date  . Arthritis   . 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 BPH (benign prostatic hyperplasia)   . Cataract   . Coronary artery disease, non-occlusive   . Diverticulosis 2017  . Dysphagia 12/20/2014  . GERD (gastroesophageal reflux disease)   . HFrEF (heart failure with reduced ejection fraction) ([place])   . Hypercholesterolemia   . 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 Ventricular tachycardia ([place]) 04/28/2017   a.  Status post SJM dual-chamber ICD in 04/2017    Past Surgical History:  Procedure Laterality Date  . CARDIAC CATHETERIZATION  04/28/2017  . CATARACT EXTRACTION W/ INTRAOCULAR LENS IMPLANT Right 2017  . CATARACT EXTRACTION W/PHACO Left 05/20/2019   Procedure: CATARACT  EXTRACTION PHACO AND INTRAOCULAR LENS PLACEMENT ([place]) LEFT;  Surgeon: Birder Robson, MD;  Location: [facility] ORS;  Service: Ophthalmology;  Laterality: Left;  [place] 01:19.7 CDE 14.58 Fluid Pack Lot # [id number]  . ESOPHAGOGASTRODUODENOSCOPY (EGD) WITH PROPOFOL N/A 02/19/2015   Procedure: ESOPHAGOGASTRODUODENOSCOPY (EGD) WITH PROPOFOL and esophageal dilation.;  Surgeon: Lucilla Lame, MD;  Location: [place];  Service: Endoscopy;  Laterality: N/A;  . EYE SURGERY    . ICD IMPLANT N/A 04/29/2017   Procedure: ICD IMPLANT;  Surgeon: Evans Lance, MD;  Location: [place] CV [facility];  Service: Cardiovascular;  Laterality: N/A;  . LEFT HEART CATH AND CORONARY ANGIOGRAPHY N/A 04/28/2017   Procedure: LEFT HEART CATH AND CORONARY ANGIOGRAPHY;  Surgeon: Martinique, Peter M, MD;  Location: [place] [facility];  Service: Cardiovascular;  Laterality: N/A;  . PROSTATE BIOPSY  ~ 2012  . RETINAL DETACHMENT SURGERY Right 1990s   "torn retina"    Current Meds  Medication Sig  . ENTRESTO 49-51 MG TAKE ONE (1) TABLET BY MOUTH TWO TIMES PER DAY  . eplerenone (INSPRA) 25 MG tablet Take 1 tablet (25 mg total) by mouth daily.  . finasteride (PROSCAR) 5 MG tablet Take 1 tablet (5 mg total) by mouth at bedtime.  . Multiple Vitamins-Minerals (MULTIVITAMIN ADULT PO) Take 1 tablet by mouth every morning.   [name] omeprazole (  PRILOSEC) 20 MG capsule Take 1 capsule (20 mg total) by mouth daily.  . sotalol (BETAPACE) 80 MG tablet Take 1 tablet (80 mg total) by mouth 2 (two) times daily.  . tamsulosin (FLOMAX) 0.4 MG CAPS capsule Take 1 capsule (0.4 mg total) by mouth daily.    No Known Allergies    Review of Systems negative except from HPI and PMH  Physical Exam BP 120/78   Pulse 64   Ht 5\' 10"  (1.778 m)   Wt 183 lb (83 kg)   BMI 26.26 kg/m  Well developed and well nourished in no acute distress HENT normal Neck supple with JVP-flat Clear Device pocket well healed; without hematoma or erythema.  There is no  tethering  Regular rate and rhythm, no  murmur Abd-soft with active BS No Clubbing cyanosis   edema Skin-warm and dry A & Oriented  Grossly normal sensory and motor function  ECG sinus at 64 30/09/42 Occasional PVC  Assessment and  Plan  Nonischemic cardiomyopathy interval worsening  Ventricular tachycardia--PVCs frequent   Implantable defibrillator-secondary prevention  St Jude   Orthostatic intolerance  High Risk Medication Surveillance sotalol., aldactone  Fatigue  First-degree AV block   PVC burden seems to be significantly less based on histograms although the PVC count itself is relatively unchanged over the last 2 years about 2%.    Dyspnea is ongoing but not severe.  With his cardiomyopathy we will begin him on Farxiga.  We have discussed parameters by which we will continue given its modest benefit on mortality and is largely related to its impact on symptoms and hospitalizations.  On sotalol, will get magnesium.  First-degree AV block may be problematic particularly if it lengthened with exercise.  If dyspnea continues to worsen we will submitted for treadmill testing        Current medicines are reviewed at length with the patient today .  The patient does not  have concerns regarding medicines.

## 2020-06-12 NOTE — Patient Instructions (Signed)
Medication Instructions:  - Your physician has recommended you make the following change in your medication:   1) START Farxiga 10 mg- take 1 tablet by mouth once daily   *If you need a refill on your cardiac medications before your next appointment, please call your pharmacy*   Lab Work: - Your physician recommends that you have lab work today: Magnesium  If you have labs (blood work) drawn today and your tests are completely normal, you will receive your results only by: [name] MyChart Message (if you have MyChart) OR . A paper copy in the mail If you have any lab test that is abnormal or we need to change your treatment, we will call you to review the results.   Testing/Procedures: - none ordered   Follow-Up: At [facility], you and your health needs are our priority.  As part of our continuing mission to provide you with exceptional heart care, we have created designated Provider Care Teams.  These Care Teams include your primary Cardiologist (physician) and Advanced Practice Providers (APPs -  Physician Assistants and Nurse Practitioners) who all work together to provide you with the care you need, when you need it.  We recommend signing up for the patient portal called "MyChart".  Sign up information is provided on this After Visit Summary.  MyChart is used to connect with patients for Virtual Visits (Telemedicine).  Patients are able to view lab/test results, encounter notes, upcoming appointments, etc.  Non-urgent messages can be sent to your provider as well.   To learn more about what you can do with MyChart, go to [email or web address].    Your next appointment:   1) 3 months with Dr. Rockey Situ   2) 6 months with Dr. Caryl Comes  The format for your next appointment:   In Person  Provider:   as above   Other Instructions   Farxiga (Dapagliflozin) tablets What is this medicine? DAPAGLIFLOZIN (DAP a gli FLOE zin) controls blood sugar in people with diabetes. It is used  with lifestyle changes like diet and exercise. It also treats heart failure and kidney disease. It may lower the risk for treatment of heart failure in the hospital or worsened kidney disease. This medicine may be used for other purposes; ask your health care provider or pharmacist if you have questions. COMMON BRAND NAME(S): Wilder Glade What should I tell my health care provider before I take this medicine? They need to know if you have any of these conditions:  dehydration  diabetic ketoacidosis  diet low in salt  eating less due to illness, surgery, dieting, or any other reason  having surgery  history of pancreatitis or pancreas problems  history of yeast infection of the penis or vagina  if you often drink alcohol  infection in the bladder, kidneys, or urinary tract  kidney disease  low blood pressure  on dialysis  problems urinating  type 1 diabetes  uncircumcised male  an unusual or allergic reaction to dapagliflozin, other medicines, foods, dyes, or preservatives  pregnant or trying to get pregnant  breast-feeding How should I use this medicine? Take this medicine by mouth with water. Take it as directed on the prescription label at the same time every day. You can take it with or without food. If it upsets your stomach, take it with food. Keep taking it unless your health care provider tells you to stop. A special MedGuide will be given to you by the pharmacist with each prescription and refill. Be sure to read  this information carefully each time. Talk to your health care provider about the use of this medicine in children. Special care may be needed. Overdosage: If you think you have taken too much of this medicine contact a poison control center or emergency room at once. NOTE: This medicine is only for you. Do not share this medicine with others. What if I miss a dose? If you miss a dose, take it as soon as you can. If it is almost time for your next dose,  take only that dose. Do not take double or extra doses. What may interact with this medicine? Interactions are not expected. This list may not describe all possible interactions. Give your health care provider a list of all the medicines, herbs, non-prescription drugs, or dietary supplements you use. Also tell them if you smoke, drink alcohol, or use illegal drugs. Some items may interact with your medicine. What should I watch for while using this medicine? Visit your health care provider for regular checks on your progress. Tell your health care provider if your symptoms do not start to get better or if they get worse. This medicine can cause a serious condition in which there is too much acid in the blood. If you develop nausea, vomiting, stomach pain, unusual tiredness, or breathing problems, stop taking this medicine and call your doctor right away. If possible, use a ketone dipstick to check for ketones in your urine. Check with your health care provider if you have severe diarrhea, nausea, and vomiting, or if you sweat a lot. The loss of too much body fluid may make it dangerous for you to take this medicine. A test called the HbA1C (A1C) will be monitored. This is a simple blood test. It measures your blood sugar control over the last 2 to 3 months. You will receive this test every 3 to 6 months. Learn how to check your blood sugar. Learn the symptoms of low and high blood sugar and how to manage them. Always carry a quick-source of sugar with you in case you have symptoms of low blood sugar. Examples include hard sugar candy or glucose tablets. Make sure others know that you can choke if you eat or drink when you develop serious symptoms of low blood sugar, such as seizures or unconsciousness. Get medical help at once. Tell your health care provider if you have high blood sugar. You might need to change the dose of your medicine. If you are sick or exercising more than usual, you may need to  change the dose of your medicine. Do not skip meals. Ask your health care provider if you should avoid alcohol. Many nonprescription cough and cold products contain sugar or alcohol. These can affect blood sugar. Wear a medical ID bracelet or chain. Carry a card that describes your condition. List the medicines and doses you take on the card. What side effects may I notice from receiving this medicine? Side effects that you should report to your doctor or health care professional as soon as possible:  allergic reactions (skin rash, itching or hives, swelling of the face, lips, or tongue)  breathing problems  dizziness  feeling faint or lightheaded, falls  genital infection (fever; tenderness, redness, or swelling in the genitals or area from the genitals to the back of the rectum)  kidney injury (trouble passing urine or change in the amount of urine)  low blood sugar (feeling anxious; confusion; dizziness; increased hunger; unusually weak or tired; increased sweating; shakiness; cold, clammy  skin; irritable; headache; blurred vision; fast heartbeat; loss of consciousness)  muscle weakness  nausea, vomiting, unusual stomach upset or pain  new pain or tenderness, change in skin color, sores or ulcers, or infection in legs or feet  penile discharge, itching, or pain  unusual tiredness  unusual vaginal discharge, itching, or odor  urinary tract infection (fever; chills; a burning feeling when urinating; urgent need to urinate more often; blood in the urine; back pain) Side effects that usually do not require medical attention (report to your doctor or health care professional if they continue or are bothersome):  mild increase in urination  thirsty This list may not describe all possible side effects. Call your doctor for medical advice about side effects. You may report side effects to FDA at 1-800-FDA-1088. Where should I keep my medicine? Keep out of the reach of children and  pets. Store at room temperature between 20 and 25 degrees C (68 and 77 degrees F). Get rid of any unused medicine after the expiration date. To get rid of medicines that are no longer needed or have expired:  Take the medicine to a medicine take-back program. Check with your pharmacy or law enforcement to find a location.  If you cannot return the medicine, check the label or package insert to see if the medicine should be thrown out in the garbage or flushed down the toilet. If you are not sure, ask your health care provider. If it is safe to put it in the trash, take the medicine out of the container. Mix the medicine with cat litter, dirt, coffee grounds, or other unwanted substance. Seal the mixture in a bag or container. Put it in the trash. NOTE: This sheet is a summary. It may not cover all possible information. If you have questions about this medicine, talk to your doctor, pharmacist, or health care provider.  2021 Elsevier/Gold Standard (2019-08-03 13:18:47)

## 2020-06-13 LAB — MAGNESIUM: Magnesium: 2.3 mg/dL (ref 1.6–2.3)

## 2020-07-18 ENCOUNTER — Other Ambulatory Visit: Payer: Self-pay | Admitting: Internal Medicine

## 2020-08-16 ENCOUNTER — Encounter: Payer: Self-pay | Admitting: Internal Medicine

## 2020-08-24 ENCOUNTER — Ambulatory Visit (INDEPENDENT_AMBULATORY_CARE_PROVIDER_SITE_OTHER): Payer: Medicare HMO

## 2020-08-24 DIAGNOSIS — I428 Other cardiomyopathies: Secondary | ICD-10-CM

## 2020-08-24 DIAGNOSIS — I5022 Chronic systolic (congestive) heart failure: Secondary | ICD-10-CM

## 2020-08-24 LAB — CUP PACEART REMOTE DEVICE CHECK
Battery Remaining Longevity: 64 mo
Battery Remaining Percentage: 66 %
Battery Voltage: 2.96 V
Brady Statistic AP VP Percent: 1.1 %
Brady Statistic AP VS Percent: 4.8 %
Brady Statistic AS VP Percent: 3.2 %
Brady Statistic AS VS Percent: 90 %
Brady Statistic RA Percent Paced: 5 %
Brady Statistic RV Percent Paced: 4.2 %
Date Time Interrogation Session: 20220617040026
HighPow Impedance: 77 Ohm
HighPow Impedance: 77 Ohm
Implantable Lead Implant Date: 20190220
Implantable Lead Implant Date: 20190220
Implantable Lead Location: 753859
Implantable Lead Location: 753860
Implantable Lead Model: 7122
Implantable Pulse Generator Implant Date: 20190220
Lead Channel Impedance Value: 380 Ohm
Lead Channel Impedance Value: 440 Ohm
Lead Channel Pacing Threshold Amplitude: 0.75 V
Lead Channel Pacing Threshold Amplitude: 1 V
Lead Channel Pacing Threshold Pulse Width: 0.5 ms
Lead Channel Pacing Threshold Pulse Width: 0.5 ms
Lead Channel Sensing Intrinsic Amplitude: 1.4 mV
Lead Channel Sensing Intrinsic Amplitude: 11.8 mV
Lead Channel Setting Pacing Amplitude: 2 V
Lead Channel Setting Pacing Amplitude: 2.5 V
Lead Channel Setting Pacing Pulse Width: 0.5 ms
Lead Channel Setting Sensing Sensitivity: 0.5 mV
Pulse Gen Serial Number: 9787528

## 2020-09-11 ENCOUNTER — Telehealth: Payer: Self-pay

## 2020-09-11 NOTE — Telephone Encounter (Signed)
Copied from [place] [id number]. Topic: Appointment Scheduling - Scheduling Inquiry for Clinic >> Sep 11, 2020 12:16 PM Bayard Beaver wrote: Reason for CRM: Patient called in about being asked to see dr Neomia Dear, instead of Dr Wynetta Emery, whom he saw last year. He said he wants to see Dr Wynetta Emery instead of Dr Neomia Dear. Please call back about this.

## 2020-09-12 ENCOUNTER — Other Ambulatory Visit: Payer: Self-pay

## 2020-09-12 ENCOUNTER — Ambulatory Visit: Payer: Medicare HMO | Admitting: Cardiovascular Disease

## 2020-09-12 ENCOUNTER — Encounter: Payer: Self-pay | Admitting: Cardiovascular Disease

## 2020-09-12 VITALS — BP 120/64 | HR 72 | Ht 70.0 in | Wt 181.2 lb

## 2020-09-12 DIAGNOSIS — I420 Dilated cardiomyopathy: Secondary | ICD-10-CM | POA: Diagnosis not present

## 2020-09-12 DIAGNOSIS — Z95810 Presence of automatic (implantable) cardiac defibrillator: Secondary | ICD-10-CM | POA: Diagnosis not present

## 2020-09-12 DIAGNOSIS — I5022 Chronic systolic (congestive) heart failure: Secondary | ICD-10-CM

## 2020-09-12 NOTE — Progress Notes (Signed)
Remote ICD transmission.   

## 2020-09-12 NOTE — Progress Notes (Signed)
Cardiology Office Note  Date:  09/12/2020   ID:  Leroy Merritt, Leroy Merritt 01/05/1946, MRN [id number]  PCP:  Charlynne Cousins, MD   Chief Complaint  Patient presents with   3 month follow up     Patient had to stop the Farxiga due to redness and swelling in the genitalia area. Medications reviewed by the patient verbally.     HPI:  [name] is a 75 yo male with PMH Of a "floppy heart"per the patient based off previous stress test though details unclear nonobstructive CAD by [facility] in 04/2017  2/19 Echo  30-35 %  HFrEF with severe LV dysfunction secondary to NICM, symptomatic VT status post SJM dual-chamber ICD in 04/2017 1/22 Echo 40-45%  Who presents for follow-up of his cardiomyopathy  LOV with myself10/2021 Seen by Dr. Caryl Comes 06/12/2020  Did not tolerate spironolactone in the past secondary to breast tenderness Did not tolerate Farxiga secondary to scrotal and groin rash, possible yeast infection, resolved with treatment  Reports feeling well with no complaints, no leg edema no abdominal distention no weight change no PND orthopnea Denies chest pain or shortness of breath on exertion  Pacer downloads reviewed with him in detail Less than 1% tachyarrhythmia  EKG personally reviewed by myself on todays visit Normal sinus rhythm rate 72 bpm no significant ST-T wave changes   Other past medical history reviewed Discussed prior events 2019  had long runs of sustained VT, underlying sinus rhythm Clear A-V dissociation  transferred from our office to [facility] which showed no obstructive CAD,  LVEF was markedly reduced with LV gram estimate of EF of 25 to 35%.    Echo on 04/29/2017 showed an EF of 30 to 35%,   Cardiac MRI on 04/29/2017 showed severe LVE with diffuse hypokinesis with an EF of 27%,   [employer] dual-chamber ICD implantation on 04/29/2017.  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  PSH:    Past Surgical History:  Procedure Laterality Date   CARDIAC CATHETERIZATION  04/28/2017   CATARACT EXTRACTION W/ INTRAOCULAR LENS IMPLANT Right 2017   CATARACT EXTRACTION W/PHACO Left 05/20/2019   Procedure: CATARACT EXTRACTION PHACO AND INTRAOCULAR LENS PLACEMENT ([place]) LEFT;  Surgeon: Birder Robson, MD;  Location: [facility] ORS;  Service: Ophthalmology;  Laterality: Left;  [place] 01:19.7 CDE 14.58 Fluid Pack Lot # [id number]   ESOPHAGOGASTRODUODENOSCOPY (EGD) WITH PROPOFOL N/A 02/19/2015   Procedure: ESOPHAGOGASTRODUODENOSCOPY (EGD) WITH PROPOFOL and esophageal dilation.;  Surgeon: Lucilla Lame, MD;  Location: [place];  Service: Endoscopy;  Laterality: N/A;   EYE SURGERY     ICD IMPLANT N/A 04/29/2017   Procedure: ICD IMPLANT;  Surgeon: Evans Lance, MD;  Location: [place] [facility];  Service: Cardiovascular;  Laterality: N/A;   LEFT HEART CATH AND CORONARY ANGIOGRAPHY N/A 04/28/2017   Procedure: LEFT HEART CATH AND CORONARY ANGIOGRAPHY;  Surgeon: Martinique, Peter M, MD;  Location: [place] [facility];  Service: Cardiovascular;  Laterality: N/A;   PROSTATE BIOPSY  ~ 2012   RETINAL DETACHMENT SURGERY Right 1990s   "torn retina"    Current Outpatient Medications  Medication  Sig Dispense Refill   ENTRESTO 49-51 MG TAKE ONE (1) TABLET BY MOUTH TWO TIMES PER DAY 180 tablet 3   eplerenone (INSPRA) 25 MG tablet TAKE ONE TABLET BY MOUTH DAILY 90 tablet 3   finasteride (PROSCAR) 5 MG tablet Take 1 tablet (5 mg total) by mouth at bedtime. 90 tablet 3   Multiple Vitamins-Minerals  (MULTIVITAMIN ADULT PO) Take 1 tablet by mouth every morning.      omeprazole (PRILOSEC) 20 MG capsule Take 1 capsule (20 mg total) by mouth daily. 90 capsule 3   sotalol (BETAPACE) 80 MG tablet Take 1 tablet (80 mg total) by mouth 2 (two) times daily. 180 tablet 3   tamsulosin (FLOMAX) 0.4 MG CAPS capsule Take 1 capsule (0.4 mg total) by mouth daily. 90 capsule 3   No current facility-administered medications for this visit.     Allergies:   Patient has no known allergies.   Social History:  The patient  reports that he has never smoked. He has never used smokeless tobacco. He reports current alcohol use of about 1.0 standard drink of alcohol per week. He reports that he does not use drugs.   Family History:   family history includes Arthritis in his mother; Cancer in his daughter; Diabetes in his father; Heart disease in his brother; Hyperlipidemia in his brother; Hypertension in his brother and father; Lung cancer in his daughter.    Review of Systems: Review of Systems  Constitutional: Negative.        Mild fatigue  HENT: Negative.    Respiratory: Negative.    Cardiovascular: Negative.   Gastrointestinal: Negative.   Musculoskeletal: Negative.   Neurological: Negative.   Psychiatric/Behavioral: Negative.    All other systems reviewed and are negative.  PHYSICAL EXAM: VS:  BP 120/64 (BP Location: Left Arm, Patient Position: Sitting, Cuff Size: Normal)   Pulse 72   Ht 5\' 10"  (1.778 m)   Wt 181 lb 4 oz (82.2 kg)   SpO2 98%   BMI 26.01 kg/m  , BMI Body mass index is 26.01 kg/m. Constitutional:  oriented to person, place, and time. No distress.  HENT:  Head: Grossly normal Eyes:  no discharge. No scleral icterus.  Neck: No JVD, no carotid bruits  Cardiovascular: Regular rate and rhythm, no murmurs appreciated Pulmonary/Chest: Clear to auscultation bilaterally, no wheezes or rails Abdominal: Soft.  no distension.  no tenderness.  Musculoskeletal: Normal range of  motion Neurological:  normal muscle tone. Coordination normal. No atrophy Skin: Skin warm and dry Psychiatric: normal affect, pleasant  Recent Labs: 04/04/2020: B Natriuretic Peptide 44.5; BUN 21; Creatinine, Ser 1.29; Potassium 4.6; Sodium 141 06/12/2020: Magnesium 2.3    Lipid Panel Lab Results  Component Value Date   CHOL 165 08/29/2019   HDL 50 08/29/2019   LDLCALC 99 08/29/2019   TRIG 85 08/29/2019      Wt Readings from Last 3 Encounters:  09/12/20 181 lb 4 oz (82.2 kg)  06/12/20 183 lb (83 kg)  03/20/20 184 lb 2 oz (83.5 kg)      ASSESSMENT AND PLAN:  VT (ventricular tachycardia) (HCC) on sotalol ICD in place, followed by Dr. Caryl Comes ICD downloads reviewed  Tachyarrhythmias Rare palpitations, documented on ICD download less than 1% burden Managed by EP  Dilated cardiomyopathy Off spironolactone secondary to breast tenderness Did not tolerate Farxiga, groin /scrotal rashes Continue Entresto  49/51 mg p.o. twice daily, eplerenone, sotalol Does not want to increase dose of Entresto at this time Ejection fraction 45  to 50% He feels comfortable leaving things alone at this time   Total encounter time more than 25 minutes  Greater than 50% was spent in counseling and coordination of care with the patient   No orders of the defined types were placed in this encounter.    Signed, Esmond Plants, M.D., Ph.D. 09/12/2020  [place], [place]

## 2020-09-12 NOTE — Patient Instructions (Addendum)
Medication Instructions:  No changes, please continue your current medications   If you need a refill on your cardiac medications before your next appointment, please call your pharmacy.   Lab work: No new labs needed  Testing/Procedures: No new testing needed  Follow-Up: At [facility], you and your health needs are our priority.  As part of our continuing mission to provide you with exceptional heart care, we have created designated Provider Care Teams.  These Care Teams include your primary Cardiologist (physician) and Advanced Practice Providers (APPs -  Physician Assistants and Nurse Practitioners) who all work together to provide you with the care you need, when you need it.  You will need a follow up appointment in 12 months Make an appt with Dr. Caryl Comes in October  Providers on your designated Care Team:   Murray Hodgkins, NP Christell Faith, PA-C Marrianne Mood, PA-C Cadence Luverne, [place]   COVID-19 Vaccine Information can be found at: [email or web address] For questions related to vaccine distribution or appointments, please email [email or web address] or call [phone].

## 2020-09-13 NOTE — Telephone Encounter (Signed)
I'm happy to see him if he doesn't get along with Dr. Neomia Dear, but I'm not taking on Dr. Rance Muir old patients, so I'd prefer if he meets her first.

## 2020-09-13 NOTE — Telephone Encounter (Signed)
Called pt advised pt of Dr Durenda Age message pt verbalized understanding

## 2020-09-14 ENCOUNTER — Ambulatory Visit: Payer: Medicare HMO | Admitting: Family Medicine

## 2020-09-24 DIAGNOSIS — M5136 Other intervertebral disc degeneration, lumbar region: Secondary | ICD-10-CM | POA: Diagnosis not present

## 2020-09-24 DIAGNOSIS — M47816 Spondylosis without myelopathy or radiculopathy, lumbar region: Secondary | ICD-10-CM | POA: Diagnosis not present

## 2020-09-24 DIAGNOSIS — M1611 Unilateral primary osteoarthritis, right hip: Secondary | ICD-10-CM | POA: Diagnosis not present

## 2020-09-24 DIAGNOSIS — M25551 Pain in right hip: Secondary | ICD-10-CM | POA: Diagnosis not present

## 2020-11-02 DIAGNOSIS — M1611 Unilateral primary osteoarthritis, right hip: Secondary | ICD-10-CM | POA: Diagnosis not present

## 2020-11-02 DIAGNOSIS — M47816 Spondylosis without myelopathy or radiculopathy, lumbar region: Secondary | ICD-10-CM | POA: Diagnosis not present

## 2020-11-02 DIAGNOSIS — M25551 Pain in right hip: Secondary | ICD-10-CM | POA: Diagnosis not present

## 2020-11-02 DIAGNOSIS — M25561 Pain in right knee: Secondary | ICD-10-CM | POA: Diagnosis not present

## 2020-11-16 ENCOUNTER — Telehealth: Payer: Self-pay | Admitting: Family Medicine

## 2020-11-16 DIAGNOSIS — N401 Enlarged prostate with lower urinary tract symptoms: Secondary | ICD-10-CM

## 2020-11-16 DIAGNOSIS — R350 Frequency of micturition: Secondary | ICD-10-CM

## 2020-11-16 DIAGNOSIS — K2100 Gastro-esophageal reflux disease with esophagitis, without bleeding: Secondary | ICD-10-CM

## 2020-11-16 NOTE — Telephone Encounter (Signed)
Attempted to call patient to schedule appointment- left message to call office. Courtesy 30 day Rx filled.

## 2020-11-19 NOTE — Telephone Encounter (Signed)
Patient has appt now for 12/18/20

## 2020-11-23 ENCOUNTER — Ambulatory Visit (INDEPENDENT_AMBULATORY_CARE_PROVIDER_SITE_OTHER): Payer: Medicare HMO

## 2020-11-23 DIAGNOSIS — I420 Dilated cardiomyopathy: Secondary | ICD-10-CM | POA: Diagnosis not present

## 2020-11-23 DIAGNOSIS — I5022 Chronic systolic (congestive) heart failure: Secondary | ICD-10-CM

## 2020-11-25 LAB — CUP PACEART REMOTE DEVICE CHECK
Battery Remaining Longevity: 61 mo
Battery Remaining Percentage: 63 %
Battery Voltage: 2.96 V
Brady Statistic AP VP Percent: 1 %
Brady Statistic AP VS Percent: 3.8 %
Brady Statistic AS VP Percent: 4.5 %
Brady Statistic AS VS Percent: 90 %
Brady Statistic RA Percent Paced: 4.1 %
Brady Statistic RV Percent Paced: 5.5 %
Date Time Interrogation Session: 20220916040015
HighPow Impedance: 88 Ohm
HighPow Impedance: 88 Ohm
Implantable Lead Implant Date: 20190220
Implantable Lead Implant Date: 20190220
Implantable Lead Location: 753859
Implantable Lead Location: 753860
Implantable Lead Model: 7122
Implantable Pulse Generator Implant Date: 20190220
Lead Channel Impedance Value: 390 Ohm
Lead Channel Impedance Value: 440 Ohm
Lead Channel Pacing Threshold Amplitude: 0.75 V
Lead Channel Pacing Threshold Amplitude: 1 V
Lead Channel Pacing Threshold Pulse Width: 0.5 ms
Lead Channel Pacing Threshold Pulse Width: 0.5 ms
Lead Channel Sensing Intrinsic Amplitude: 1.5 mV
Lead Channel Sensing Intrinsic Amplitude: 11.8 mV
Lead Channel Setting Pacing Amplitude: 2 V
Lead Channel Setting Pacing Amplitude: 2.5 V
Lead Channel Setting Pacing Pulse Width: 0.5 ms
Lead Channel Setting Sensing Sensitivity: 0.5 mV
Pulse Gen Serial Number: 9787528

## 2020-11-28 NOTE — Progress Notes (Signed)
Remote ICD transmission.   

## 2020-12-05 DIAGNOSIS — M5136 Other intervertebral disc degeneration, lumbar region: Secondary | ICD-10-CM | POA: Diagnosis not present

## 2020-12-05 DIAGNOSIS — M1611 Unilateral primary osteoarthritis, right hip: Secondary | ICD-10-CM | POA: Diagnosis not present

## 2020-12-05 DIAGNOSIS — M79604 Pain in right leg: Secondary | ICD-10-CM | POA: Diagnosis not present

## 2020-12-05 DIAGNOSIS — M47816 Spondylosis without myelopathy or radiculopathy, lumbar region: Secondary | ICD-10-CM | POA: Diagnosis not present

## 2020-12-05 DIAGNOSIS — M25551 Pain in right hip: Secondary | ICD-10-CM | POA: Diagnosis not present

## 2020-12-18 ENCOUNTER — Encounter: Payer: Self-pay | Admitting: Family Medicine

## 2020-12-18 ENCOUNTER — Ambulatory Visit (INDEPENDENT_AMBULATORY_CARE_PROVIDER_SITE_OTHER): Payer: Medicare HMO | Admitting: Family Medicine

## 2020-12-18 ENCOUNTER — Other Ambulatory Visit: Payer: Self-pay

## 2020-12-18 VITALS — BP 154/95 | HR 60 | Ht 70.0 in | Wt 181.0 lb

## 2020-12-18 DIAGNOSIS — Z23 Encounter for immunization: Secondary | ICD-10-CM | POA: Diagnosis not present

## 2020-12-18 DIAGNOSIS — R350 Frequency of micturition: Secondary | ICD-10-CM

## 2020-12-18 DIAGNOSIS — K2100 Gastro-esophageal reflux disease with esophagitis, without bleeding: Secondary | ICD-10-CM | POA: Diagnosis not present

## 2020-12-18 DIAGNOSIS — Z0000 Encounter for general adult medical examination without abnormal findings: Secondary | ICD-10-CM

## 2020-12-18 DIAGNOSIS — I5022 Chronic systolic (congestive) heart failure: Secondary | ICD-10-CM | POA: Diagnosis not present

## 2020-12-18 DIAGNOSIS — Z136 Encounter for screening for cardiovascular disorders: Secondary | ICD-10-CM

## 2020-12-18 DIAGNOSIS — N401 Enlarged prostate with lower urinary tract symptoms: Secondary | ICD-10-CM | POA: Diagnosis not present

## 2020-12-18 DIAGNOSIS — I420 Dilated cardiomyopathy: Secondary | ICD-10-CM | POA: Diagnosis not present

## 2020-12-18 LAB — URINALYSIS, ROUTINE W REFLEX MICROSCOPIC
Bilirubin, UA: NEGATIVE
Glucose, UA: NEGATIVE
Ketones, UA: NEGATIVE
Nitrite, UA: NEGATIVE
Protein,UA: NEGATIVE
RBC, UA: NEGATIVE
Specific Gravity, UA: 1.025 (ref 1.005–1.030)
Urobilinogen, Ur: 0.2 mg/dL (ref 0.2–1.0)
pH, UA: 5.5 (ref 5.0–7.5)

## 2020-12-18 LAB — MICROSCOPIC EXAMINATION
Bacteria, UA: NONE SEEN
RBC, Urine: NONE SEEN /hpf (ref 0–2)

## 2020-12-18 LAB — MICROALBUMIN, URINE WAIVED
Creatinine, Urine Waived: 300 mg/dL (ref 10–300)
Microalb, Ur Waived: 30 mg/L — ABNORMAL HIGH (ref 0–19)
Microalb/Creat Ratio: <30 mg/g (ref <30)

## 2020-12-18 MED ORDER — FINASTERIDE 5 MG PO TABS: 5.0000 mg | ORAL_TABLET | Freq: Every day | ORAL | 1 refills | Status: DC

## 2020-12-18 MED ORDER — TAMSULOSIN HCL 0.4 MG PO CAPS: 0.4000 mg | ORAL_CAPSULE | Freq: Every day | ORAL | 1 refills | Status: DC

## 2020-12-18 MED ORDER — OMEPRAZOLE 20 MG PO CPDR: 20.0000 mg | DELAYED_RELEASE_CAPSULE | Freq: Every day | ORAL | 3 refills | Status: DC

## 2020-12-18 NOTE — Patient Instructions (Signed)
Preventative Services:  Health Risk Assessment and Personalized Prevention Plan: Done today Bone Mass Measurements: N/A CVD Screening: Done today Colon Cancer Screening: N/A Depression Screening: Done today Diabetes Screening: Done today Glaucoma Screening: See your eye doctor Hepatitis B vaccine: N/A Hepatitis C screening: Up to date HIV Screening: Up to date Flu Vaccine: Done today Lung cancer Screening: N/A Obesity Screening: Done today Pneumonia Vaccines (2): Up to date STI Screening: N/A PSA screening: Done today

## 2020-12-18 NOTE — Assessment & Plan Note (Signed)
Stable. Continue to follow with cardiology. Call with any concerns. Continue to monitor.  

## 2020-12-18 NOTE — Progress Notes (Signed)
BP (!) 158/93   Pulse 60   Ht 5\' 10"  (1.778 m)   Wt 181 lb (82.1 kg)   BMI 25.97 kg/m    Subjective:    Patient ID: Leroy Merritt, male    DOB: 1945-04-06, 75 y.o.   MRN: [id number]  HPI: Leroy Merritt is a 75 y.o. male presenting on 12/18/2020 for comprehensive medical examination. Current medical complaints include:  Has been having a lot of hip pain.   BPH BPH status: controlled Satisfied with current treatment?: yes Medication side effects: no Medication compliance: excellent compliance Duration: chronic Nocturia: 1/night Urinary frequency:no Incomplete voiding: no Urgency: no Weak urinary stream: no Straining to start stream: no Dysuria: no Onset: gradual Severity: mild  Interim Problems from his last visit: no  Functional Status Survey: Is the patient deaf or have difficulty hearing?: No Does the patient have difficulty seeing, even when wearing glasses/contacts?: No Does the patient have difficulty concentrating, remembering, or making decisions?: No Does the patient have difficulty walking or climbing stairs?: No Does the patient have difficulty dressing or bathing?: No Does the patient have difficulty doing errands alone such as visiting a doctor's office or shopping?: No  FALL RISK: Fall Risk  12/18/2020 08/22/2019 03/16/2017 02/28/2016 12/20/2014  Falls in the past year? 0 0 No No No  Number falls in past yr: - 0 - - -  Injury with Fall? - 0 - - -    Depression Screen Depression screen [facility] 2/9 12/18/2020 08/22/2019 03/16/2017 02/28/2016 12/20/2014  Decreased Interest 0 0 0 0 0  Down, Depressed, Hopeless 0 0 0 0 0  PHQ - 2 Score 0 0 0 0 0  Altered sleeping 0 - - - -  Tired, decreased energy 0 - - - -  Change in appetite 0 - - - -  Trouble concentrating 0 - - - -  Moving slowly or fidgety/restless 0 - - - -  Suicidal thoughts 0 - - - -  PHQ-9 Score 0 - - - -    Advanced Directives Yes has one- working on it.   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  Status post SJM dual-chamber ICD in 04/2017    Surgical History:  Past Surgical History:  Procedure Laterality Date   CARDIAC CATHETERIZATION  04/28/2017   CATARACT EXTRACTION W/ INTRAOCULAR LENS IMPLANT Right 2017   CATARACT EXTRACTION W/PHACO Left 05/20/2019   Procedure: CATARACT EXTRACTION PHACO AND INTRAOCULAR LENS PLACEMENT ([place]) LEFT;  Surgeon: Birder Robson, MD;  Location: [facility];  Service: Ophthalmology;  Laterality: Left;  [place] 01:19.7 CDE 14.58 Fluid Pack Lot # [id number]   ESOPHAGOGASTRODUODENOSCOPY (EGD) WITH PROPOFOL N/A 02/19/2015   Procedure: ESOPHAGOGASTRODUODENOSCOPY (EGD) WITH PROPOFOL and esophageal dilation.;  Surgeon: Lucilla Lame, MD;  Location: [place];  Service: Endoscopy;  Laterality: N/A;   EYE SURGERY     ICD IMPLANT N/A 04/29/2017   Procedure: ICD IMPLANT;  Surgeon: Evans Lance, MD;  Location: [place] [facility];  Service: Cardiovascular;  Laterality: N/A;  LEFT HEART CATH AND CORONARY ANGIOGRAPHY N/A 04/28/2017   Procedure: LEFT HEART CATH AND CORONARY ANGIOGRAPHY;  Surgeon: Martinique, Peter M, MD;  Location: [place] [facility];  Service: Cardiovascular;  Laterality: N/A;   PROSTATE BIOPSY  ~ 2012   RETINAL DETACHMENT SURGERY Right 1990s   "torn retina"    Medications:  Current Outpatient Medications on File Prior to Visit  Medication Sig   ENTRESTO 49-51 MG TAKE ONE (1) TABLET BY MOUTH TWO TIMES PER DAY   eplerenone (INSPRA) 25 MG tablet TAKE ONE TABLET BY MOUTH DAILY   Multiple  Vitamins-Minerals (MULTIVITAMIN ADULT PO) Take 1 tablet by mouth every morning.    sotalol (BETAPACE) 80 MG tablet Take 1 tablet (80 mg total) by mouth 2 (two) times daily.   No current facility-administered medications on file prior to visit.    Allergies:  No Known Allergies  Social History:  Social History   Socioeconomic History   Marital status: Married    Spouse name: Not on file   Number of children: Not on file   Years of education: college   Highest education level: Not on file  Occupational History   Not on file  Tobacco Use   Smoking status: Never   Smokeless tobacco: Never  Vaping Use   Vaping Use: Never used  Substance and Sexual Activity   Alcohol use: Yes    Alcohol/week: 1.0 standard drink    Types: 1 Glasses of wine per week    Comment: 1 Drink/Week   Drug use: No   Sexual activity: Yes    Birth control/protection: None    Comment: NA  Other Topics Concern   Not on file  Social History Narrative   Not on file   Social Determinants of Health   Financial Resource Strain: Not on file  Food Insecurity: Not on file  Transportation Needs: Not on file  Physical Activity: Not on file  Stress: Not on file  Social Connections: Not on file  Intimate Partner Violence: Not on file   Social History   Tobacco Use  Smoking Status Never  Smokeless Tobacco Never   Social History   Substance and Sexual Activity  Alcohol Use Yes   Alcohol/week: 1.0 standard drink   Types: 1 Glasses of wine per week   Comment: 1 Drink/Week    Family History:  Family History  Problem Relation Age of Onset   Arthritis Mother    Diabetes Father    Hypertension Father    Heart disease Brother    Hyperlipidemia Brother    Hypertension Brother    Lung cancer Daughter    Cancer Daughter     Past medical history, surgical history, medications, allergies, family history and social history reviewed with patient today and changes made to appropriate areas of the chart.    Review of Systems  Constitutional: Negative.   HENT: Negative.    Eyes: Negative.   Respiratory: Negative.    Cardiovascular: Negative.   Gastrointestinal: Negative.   Genitourinary: Negative.   Musculoskeletal:  Positive for joint pain. Negative for back pain, falls, myalgias and neck pain.  Skin: Negative.   Neurological: Negative.   Endo/Heme/Allergies: Negative.   Psychiatric/Behavioral: Negative.    All other ROS negative except what is listed above and in the HPI.      Objective:    BP (!) 158/93   Pulse 60   Ht 5\' 10"  (1.778 m)   Wt 181 lb (82.1 kg)   BMI 25.97 kg/m  Wt Readings from Last 3 Encounters:  12/18/20 181 lb (82.1 kg)  09/12/20 181 lb 4 oz (82.2 kg)  06/12/20 183 lb (83 kg)    Physical Exam Vitals and nursing note reviewed.  Constitutional:      General: He is not in acute distress.    Appearance: Normal appearance. He is normal weight. He is not ill-appearing, toxic-appearing or diaphoretic.  HENT:     Head: Normocephalic and atraumatic.     Right Ear: Tympanic membrane, ear canal and external ear normal. There is no impacted cerumen.     Left Ear: Tympanic membrane, ear canal and external ear normal. There is no impacted cerumen.     Nose: Nose normal. No congestion or rhinorrhea.     Mouth/Throat:     Mouth: Mucous membranes are moist.     Pharynx: Oropharynx is clear. No oropharyngeal exudate or posterior oropharyngeal erythema.  Eyes:     General: No scleral icterus.       Right eye: No discharge.        Left eye: No discharge.     Extraocular Movements: Extraocular movements intact.     Conjunctiva/sclera: Conjunctivae normal.     Pupils: Pupils are equal, round, and reactive to light.  Neck:     Vascular: No carotid bruit.  Cardiovascular:     Rate and Rhythm: Normal rate and regular rhythm.     Pulses: Normal pulses.     Heart sounds: No murmur heard.   No friction rub. No gallop.  Pulmonary:     Effort: Pulmonary effort is  normal. No respiratory distress.     Breath sounds: Normal breath sounds. No stridor. No wheezing, rhonchi or rales.  Chest:     Chest wall: No tenderness.  Abdominal:     General: Abdomen is flat. Bowel sounds are normal. There is no distension.     Palpations: Abdomen is soft. There is no mass.     Tenderness: There is no abdominal tenderness. There is no right CVA tenderness, left CVA tenderness, guarding or rebound.     Hernia: No hernia is present.  Genitourinary:    Comments: Genital exam deferred with shared decision making Musculoskeletal:        General: No swelling, tenderness, deformity or signs of injury.     Cervical back: Normal range of motion and neck supple. No rigidity. No muscular tenderness.     Right lower leg: No edema.     Left lower leg: No edema.  Lymphadenopathy:     Cervical: No cervical adenopathy.  Skin:    General: Skin is warm and dry.     Capillary Refill: Capillary refill takes less than 2 seconds.     Coloration: Skin is not jaundiced or pale.     Findings: No bruising, erythema, lesion or rash.  Neurological:     General: No focal deficit present.     Mental Status: He is alert and oriented to person, place, and time.     Cranial Nerves: No cranial nerve deficit.     Sensory: No sensory deficit.     Motor: No weakness.     Coordination: Coordination normal.     Gait: Gait normal.     Deep Tendon Reflexes: Reflexes normal.  Psychiatric:        Mood and Affect: Mood normal.        Behavior: Behavior normal.        Thought Content: Thought content normal.  Judgment: Judgment normal.    6CIT Screen 12/18/2020 03/16/2017  What Year? 0 points 0 points  What month? 0 points 0 points  What time? 0 points 0 points  Count back from 20 0 points 0 points  Months in reverse 0 points 0 points  Repeat phrase 2 points 0 points  Total Score 2 0     Results for orders placed or performed in visit on 11/23/20  CUP [place] CHECK   Result Value Ref Range   Date Time Interrogation Session [id number]    Pulse Generator Manufacturer [employer]    Pulse Gen Model 2411-36C Ellipse DR    Pulse Gen Serial Number [id number]    Clinic Name [facility]    Implantable Pulse Generator Type Implantable Cardiac Defibulator    Implantable Pulse Generator Implant Date 12878676    Implantable Lead Manufacturer [facility]    Implantable Lead Model 7122 Durata    Implantable Lead Serial Number [id number]    Implantable Lead Implant Date 72094709    Implantable Lead Location Detail 1 APEX    Implantable Lead Location [id number]    Implantable Lead Manufacturer [facility]    Implantable Lead Model LPA1200M Tendril MRI    Implantable Lead Serial Number [id number]    Implantable Lead Implant Date 62836629    Implantable Lead Location Detail 1 UNKNOWN    Implantable Lead Location [id number]    Lead Channel Setting Sensing Sensitivity 0.5 mV   Lead Channel Setting Sensing Adaptation Mode Adaptive Sensing    Lead Channel Setting Pacing Amplitude 2.0 V   Lead Channel Setting Pacing Pulse Width 0.5 ms   Lead Channel Setting Pacing Amplitude 2.5 V   Lead Channel Status NULL    Lead Channel Impedance Value 390 ohm   Lead Channel Sensing Intrinsic Amplitude 1.5 mV   Lead Channel Pacing Threshold Amplitude 1.0 V   Lead Channel Pacing Threshold Pulse Width 0.5 ms   Lead Channel Status NULL    Lead Channel Impedance Value 440 ohm   Lead Channel Sensing Intrinsic Amplitude 11.8 mV   Lead Channel Pacing Threshold Amplitude 0.75 V   Lead Channel Pacing Threshold Pulse Width 0.5 ms   HighPow Impedance 88 ohm   HighPow Impedance 88 ohm   HighPow Imped Status NULL    HighPow Imped Status NULL    Battery Status MOS    Battery Remaining Longevity 61 mo   Battery Remaining Percentage 63.0 %   Battery Voltage 2.96 V   Brady Statistic RA Percent Paced 4.1 %   Brady Statistic RV Percent Paced 5.5 %   Brady Statistic AP VP Percent 1.0 %   Brady Statistic AS VP  Percent 4.5 %   Brady Statistic AP VS Percent 3.8 %   Brady Statistic AS VS Percent 90.0 %      Assessment & Plan:   Problem List Items Addressed This Visit       Cardiovascular and Mediastinum   Dilated cardiomyopathy (HCC)    Stable. Continue to follow with cardiology. Call with any concerns. Continue to monitor.       Relevant Orders   Comprehensive metabolic panel   CBC with Differential/Platelet   TSH   Microalbumin, Urine Waived   Chronic systolic heart failure (HCC)    Stable. Continue to follow with cardiology. Call with any concerns. Continue to monitor.       Relevant Orders   Comprehensive metabolic panel   CBC with Differential/Platelet   TSH   Microalbumin, Urine  Waived     Digestive   Reflux esophagitis   Relevant Medications   omeprazole (PRILOSEC) 20 MG capsule     Genitourinary   BPH (benign prostatic hyperplasia)    Under good control on current regimen. Continue current regimen. Continue to monitor. Call with any concerns. Refills given. Labs drawn today.       Relevant Medications   finasteride (PROSCAR) 5 MG tablet   tamsulosin (FLOMAX) 0.4 MG CAPS capsule   Other Relevant Orders   Comprehensive metabolic panel   CBC with Differential/Platelet   PSA   TSH   Urinalysis, Routine w reflex microscopic   Other Visit Diagnoses     Wellness examination    -  Primary   Preventative care discussed today as below.    Routine general medical examination at a health care facility       Vaccines up to date. Screening labs checked today. Continue diet and exercise. Call with any concerns.    Screening for cardiovascular condition       Lipids drawn today.   Relevant Orders   Lipid Panel w/o Chol/HDL Ratio   Need for immunization against influenza       Flu shot given today.   Relevant Orders   Flu Vaccine QUAD High Dose(Fluad) (Completed)        Preventative Services:  Health Risk Assessment and Personalized Prevention Plan: Done  today Bone Mass Measurements: N/A CVD Screening: Done today Colon Cancer Screening: N/A Depression Screening: Done today Diabetes Screening: Done today Glaucoma Screening: See your eye doctor Hepatitis B vaccine: N/A Hepatitis C screening: Up to date HIV Screening: Up to date Flu Vaccine: Done today Lung cancer Screening: N/A Obesity Screening: Done today Pneumonia Vaccines (2): Up to date STI Screening: N/A PSA screening: Done today  LABORATORY TESTING:  Health maintenance labs ordered today as discussed above.   The natural history of prostate cancer and ongoing controversy regarding screening and potential treatment outcomes of prostate cancer has been discussed with the patient. The meaning of a false positive PSA and a false negative PSA has been discussed. He indicates understanding of the limitations of this screening test and wishes to proceed with screening PSA testing.   IMMUNIZATIONS:   - Tdap: Tetanus vaccination status reviewed: last tetanus booster within 10 years. - Influenza: Administered today - Pneumovax: Up to date - Prevnar: Up to date - COVID vaccine: Up to date  SCREENING: - Colonoscopy: Not applicable  Discussed with patient purpose of the colonoscopy is to detect colon cancer at curable precancerous or early stages   PATIENT COUNSELING:    Sexuality: Discussed sexually transmitted diseases, partner selection, use of condoms, avoidance of unintended pregnancy  and contraceptive alternatives.   Advised to avoid cigarette smoking.  I discussed with the patient that most people either abstain from alcohol or drink within safe limits (<=14/week and <=4 drinks/occasion for males, <=7/weeks and <= 3 drinks/occasion for females) and that the risk for alcohol disorders and other health effects rises proportionally with the number of drinks per week and how often a drinker exceeds daily limits.  Discussed cessation/primary prevention of drug use and  availability of treatment for abuse.   Diet: Encouraged to adjust caloric intake to maintain  or achieve ideal body weight, to reduce intake of dietary saturated fat and total fat, to limit sodium intake by avoiding high sodium foods and not adding table salt, and to maintain adequate dietary potassium and calcium preferably from fresh fruits, vegetables, and  low-fat dairy products.    stressed the importance of regular exercise  Injury prevention: Discussed safety belts, safety helmets, smoke detector, smoking near bedding or upholstery.   Dental health: Discussed importance of regular tooth brushing, flossing, and dental visits.   Follow up plan: NEXT PREVENTATIVE PHYSICAL DUE IN 1 YEAR. Return in about 6 months (around 06/18/2021), or with PCP.

## 2020-12-18 NOTE — Assessment & Plan Note (Signed)
Under good control on current regimen. Continue current regimen. Continue to monitor. Call with any concerns. Refills given. Labs drawn today.   

## 2020-12-19 LAB — CBC WITH DIFFERENTIAL/PLATELET
Basophils Absolute: 0 10*3/uL (ref 0.0–0.2)
Basos: 1 %
EOS (ABSOLUTE): 0.2 10*3/uL (ref 0.0–0.4)
Eos: 3 %
Hematocrit: 42.9 % (ref 37.5–51.0)
Hemoglobin: 14.2 g/dL (ref 13.0–17.7)
Immature Grans (Abs): 0.1 10*3/uL (ref 0.0–0.1)
Immature Granulocytes: 1 %
Lymphocytes Absolute: 1.3 10*3/uL (ref 0.7–3.1)
Lymphs: 21 %
MCH: 29.3 pg (ref 26.6–33.0)
MCHC: 33.1 g/dL (ref 31.5–35.7)
MCV: 89 fL (ref 79–97)
Monocytes Absolute: 0.9 10*3/uL (ref 0.1–0.9)
Monocytes: 15 %
Neutrophils Absolute: 3.6 10*3/uL (ref 1.4–7.0)
Neutrophils: 59 %
Platelets: 192 10*3/uL (ref 150–450)
RBC: 4.84 x10E6/uL (ref 4.14–5.80)
RDW: 14 % (ref 11.6–15.4)
WBC: 6.1 10*3/uL (ref 3.4–10.8)

## 2020-12-19 LAB — COMPREHENSIVE METABOLIC PANEL
ALT: 10 IU/L (ref 0–44)
AST: 16 IU/L (ref 0–40)
Albumin/Globulin Ratio: 1.7 (ref 1.2–2.2)
Albumin: 4 g/dL (ref 3.7–4.7)
Alkaline Phosphatase: 101 IU/L (ref 44–121)
BUN/Creatinine Ratio: 14 (ref 10–24)
BUN: 15 mg/dL (ref 8–27)
Bilirubin Total: 0.3 mg/dL (ref 0.0–1.2)
CO2: 21 mmol/L (ref 20–29)
Calcium: 8.7 mg/dL (ref 8.6–10.2)
Chloride: 103 mmol/L (ref 96–106)
Creatinine, Ser: 1.1 mg/dL (ref 0.76–1.27)
Globulin, Total: 2.4 g/dL (ref 1.5–4.5)
Glucose: 88 mg/dL (ref 70–99)
Potassium: 4.2 mmol/L (ref 3.5–5.2)
Sodium: 139 mmol/L (ref 134–144)
Total Protein: 6.4 g/dL (ref 6.0–8.5)
eGFR: 69 mL/min/{1.73_m2} (ref >59)

## 2020-12-19 LAB — PSA: Prostate Specific Ag, Serum: 1.4 ng/mL (ref 0.0–4.0)

## 2020-12-19 LAB — LIPID PANEL W/O CHOL/HDL RATIO
Cholesterol, Total: 151 mg/dL (ref 100–199)
HDL: 41 mg/dL (ref >39)
LDL Chol Calc (NIH): 90 mg/dL (ref 0–99)
Triglycerides: 110 mg/dL (ref 0–149)
VLDL Cholesterol Cal: 20 mg/dL (ref 5–40)

## 2020-12-19 LAB — TSH: TSH: 2.37 u[IU]/mL (ref 0.450–4.500)

## 2020-12-20 DIAGNOSIS — M1611 Unilateral primary osteoarthritis, right hip: Secondary | ICD-10-CM | POA: Diagnosis not present

## 2020-12-23 DIAGNOSIS — M1611 Unilateral primary osteoarthritis, right hip: Secondary | ICD-10-CM | POA: Insufficient documentation

## 2020-12-24 ENCOUNTER — Other Ambulatory Visit: Payer: Self-pay | Admitting: Cardiovascular Disease

## 2020-12-24 DIAGNOSIS — I472 Ventricular tachycardia, unspecified: Secondary | ICD-10-CM

## 2021-01-18 NOTE — Discharge Instructions (Signed)
Instructions after Total Hip Replacement     Etoy Mcdonnell P. Pachia Strum, Jr., M.D.     Dept. of Orthopaedics & Sports Medicine  [facility]  [address]  [place], [place]  [address]  Phone: [phone]   Fax: [phone]    DIET: . Drink plenty of non-alcoholic fluids. . Resume your normal diet. Include foods high in fiber.  ACTIVITY:  . You may use crutches or a walker with weight-bearing as tolerated, unless instructed otherwise. . You may be weaned off of the walker or crutches by your Physical Therapist.  . Do NOT reach below the level of your knees or cross your legs until allowed.    . Continue doing gentle exercises. Exercising will reduce the pain and swelling, increase motion, and prevent muscle weakness.   . Please continue to use the TED compression stockings for 6 weeks. You may remove the stockings at night, but should reapply them in the morning. . Do not drive or operate any equipment until instructed.  WOUND CARE:  . Continue to use ice packs periodically to reduce pain and swelling. . Keep the incision clean and dry. . You may bathe or shower after the staples are removed at the first office visit following surgery.  MEDICATIONS: . You may resume your regular medications. . Please take the pain medication as prescribed on the medication. . Do not take pain medication on an empty stomach. . You have been given a prescription for a blood thinner to prevent blood clots. Please take the medication as instructed. (NOTE: After completing a 2 week course of Lovenox, take one Enteric-coated aspirin once a day.) . Pain medications and iron supplements can cause constipation. Use a stool softener (Senokot or Colace) on a daily basis and a laxative (dulcolax or miralax) as needed. . Do not drive or drink alcoholic beverages when taking pain medications.  CALL THE OFFICE FOR: . Temperature above 101 degrees . Excessive bleeding or drainage on the dressing. . Excessive  swelling, coldness, or paleness of the toes. . Persistent nausea and vomiting.  FOLLOW-UP:  . You should have an appointment to return to the office in 6 weeks after surgery. . Arrangements have been made for continuation of Physical Therapy (either home therapy or outpatient therapy).     [facility] Department Directory         [email or web address]       [email or web address]          Cardiology  Appointments: [place] - [phone] [place] - [phone]  Endocrinology  Appointments: [place] - [phone] [place] - [phone]  Gastroenterology  Appointments: [place] - [phone] [place] - [phone]        General Surgery   Appointments: [place] - [phone]  Internal Medicine/Family Medicine  Appointments: [place] - [phone] [place] - [phone] [place] - [phone]  Metabolic and Weigh Loss Surgery  Appointments: [place] - [phone]        Neurology  Appointments: [place] - [phone] [place] - [phone]  Neurosurgery  Appointments: [place] - [phone]  Obstetrics & Gynecology  Appointments: [place] - [phone] [place] - [phone]        Pediatrics  Appointments: [place] - [phone] [place] - [phone]  Physiatry  Appointments: [place] -[phone]  Physical Therapy  Appointments: [place] - [phone] [place] - [phone]        Podiatry  Appointments: [place] - [phone] [place] - [phone]  Pulmonology  Appointments: [place] - [phone]  Rheumatology  Appointments:  - [phone]         Location: [facility]  [address] [place], [place]  [address]  [place] Location: [facility] [address] [place], [place]  [address]  [place] Location: [facility] [address] [place], [place]  [address]    

## 2021-01-28 ENCOUNTER — Telehealth: Payer: Self-pay | Admitting: Cardiovascular Disease

## 2021-01-28 ENCOUNTER — Telehealth: Payer: Self-pay

## 2021-01-28 DIAGNOSIS — M1611 Unilateral primary osteoarthritis, right hip: Secondary | ICD-10-CM | POA: Diagnosis not present

## 2021-01-28 NOTE — Telephone Encounter (Signed)
Surgeon office calling.   Tiffany states that patient was in the office today for an H&P and there was not a clearance.  Patient is to see pre op tomorrow per the surgeons office and the clearance is needed for that.    Once clearance is obtained please fax to:  Attn: Tiffany  [phone]

## 2021-01-28 NOTE — Telephone Encounter (Signed)
   [facility] Pre-operative Risk Assessment    Patient Name: KEMAR PANDIT  DOB: April 12, 1945 MRN: [id number]  HEARTCARE STAFF:  - IMPORTANT!!!!!! Under Visit Info/Reason for Call, type in Other and utilize the format Clearance MM/DD/YY or Clearance TBD. Do not use dashes or single digits. - Please review there is not already an duplicate clearance open for this procedure. - If request is for dental extraction, please clarify the # of teeth to be extracted. - If the patient is currently at the dentist's office, call Pre-Op Callback Staff (MA/nurse) to input urgent request.  - If the patient is not currently in the dentist office, please route to the Pre-Op pool.  Request for surgical clearance:  What type of surgery is being performed? Right total hip arthroplasty  When is this surgery scheduled? 02/08/21  What type of clearance is required (medical clearance vs. Pharmacy clearance to hold med vs. Both)? Both   Are there any medications that need to be held prior to surgery and how long? Unknown PATIENT CALLING   Practice name and name of physician performing surgery? [facility]   What is the office phone number? [phone]   7.   What is the office fax number? [phone]  8.   Anesthesia type (None, local, MAC, general) ? UNKNOWN PATIENT CALLING    Clarisse Gouge 01/28/2021, 11:01 AM  _________________________________________________________________   (provider comments below)

## 2021-01-28 NOTE — Telephone Encounter (Signed)
ADDENDUM TO PRE OP CLEARANCE: ANESTHESIA LISTED AS CHOICE  PT IS NOT ON ANY BLOOD THINNERS.   I WILL UPDATE THE PRE OP PROVIDER AS SURGERY IS SET FOR 02/08/21.

## 2021-01-28 NOTE — Telephone Encounter (Signed)
Per chart review, It appears pre-opt team is handling this at current.  Refer to 01/28/21 encounter from [place], [place]

## 2021-01-28 NOTE — Telephone Encounter (Signed)
    Patient Name: Leroy Merritt  DOB: [birth year] MRN: [id number]  Primary Cardiologist: Ida Rogue, MD  Chart reviewed as part of pre-operative protocol coverage.   LVMTCB.   [place], [place] 01/28/2021, 2:45 PM

## 2021-01-29 ENCOUNTER — Other Ambulatory Visit: Payer: Self-pay

## 2021-01-29 ENCOUNTER — Encounter: Payer: Self-pay | Admitting: Internal Medicine

## 2021-01-29 ENCOUNTER — Encounter
Admission: RE | Admit: 2021-01-29 | Discharge: 2021-01-29 | Disposition: A | Discharge status: Home / Self Care | Payer: Medicare HMO | Source: Ambulatory Visit | Attending: Orthopedic Surgery | Admitting: Orthopedic Surgery

## 2021-01-29 VITALS — BP 133/90 | HR 67 | Resp 18 | Ht 70.0 in | Wt 179.0 lb

## 2021-01-29 DIAGNOSIS — I5022 Chronic systolic (congestive) heart failure: Secondary | ICD-10-CM

## 2021-01-29 DIAGNOSIS — I420 Dilated cardiomyopathy: Secondary | ICD-10-CM | POA: Diagnosis not present

## 2021-01-29 DIAGNOSIS — Z01818 Encounter for other preprocedural examination: Secondary | ICD-10-CM | POA: Insufficient documentation

## 2021-01-29 DIAGNOSIS — I472 Ventricular tachycardia, unspecified: Secondary | ICD-10-CM

## 2021-01-29 DIAGNOSIS — Z95810 Presence of automatic (implantable) cardiac defibrillator: Secondary | ICD-10-CM

## 2021-01-29 DIAGNOSIS — I8393 Asymptomatic varicose veins of bilateral lower extremities: Secondary | ICD-10-CM

## 2021-01-29 DIAGNOSIS — Z01812 Encounter for preprocedural laboratory examination: Secondary | ICD-10-CM

## 2021-01-29 DIAGNOSIS — I440 Atrioventricular block, first degree: Secondary | ICD-10-CM

## 2021-01-29 DIAGNOSIS — K2100 Gastro-esophageal reflux disease with esophagitis, without bleeding: Secondary | ICD-10-CM | POA: Diagnosis not present

## 2021-01-29 DIAGNOSIS — M1611 Unilateral primary osteoarthritis, right hip: Secondary | ICD-10-CM | POA: Diagnosis not present

## 2021-01-29 HISTORY — DX: Presence of automatic (implantable) cardiac defibrillator: Z95.810

## 2021-01-29 LAB — URINALYSIS, ROUTINE W REFLEX MICROSCOPIC
Bilirubin Urine: NEGATIVE
Glucose, UA: NEGATIVE mg/dL
Hgb urine dipstick: NEGATIVE
Ketones, ur: NEGATIVE mg/dL
Leukocytes,Ua: NEGATIVE
Nitrite: NEGATIVE
Protein, ur: NEGATIVE mg/dL
Specific Gravity, Urine: 1.018 (ref 1.005–1.030)
pH: 5 (ref 5.0–8.0)

## 2021-01-29 LAB — COMPREHENSIVE METABOLIC PANEL
ALT: 13 U/L (ref 0–44)
AST: 16 U/L (ref 15–41)
Albumin: 3.7 g/dL (ref 3.5–5.0)
Alkaline Phosphatase: 76 U/L (ref 38–126)
Anion gap: 7 (ref 5–15)
BUN: 18 mg/dL (ref 8–23)
CO2: 25 mmol/L (ref 22–32)
Calcium: 9 mg/dL (ref 8.9–10.3)
Chloride: 107 mmol/L (ref 98–111)
Creatinine, Ser: 1.09 mg/dL (ref 0.61–1.24)
GFR, Estimated: >60 mL/min (ref >60)
Glucose, Bld: 92 mg/dL (ref 70–99)
Potassium: 3.9 mmol/L (ref 3.5–5.1)
Sodium: 139 mmol/L (ref 135–145)
Total Bilirubin: 0.8 mg/dL (ref 0.3–1.2)
Total Protein: 6.8 g/dL (ref 6.5–8.1)

## 2021-01-29 LAB — SEDIMENTATION RATE: Sed Rate: 4 mm/hr (ref 0–20)

## 2021-01-29 LAB — TYPE AND SCREEN
ABO/RH(D): A NEG
Antibody Screen: NEGATIVE

## 2021-01-29 LAB — CBC
HCT: 44 % (ref 39.0–52.0)
Hemoglobin: 14.6 g/dL (ref 13.0–17.0)
MCH: 30 pg (ref 26.0–34.0)
MCHC: 33.2 g/dL (ref 30.0–36.0)
MCV: 90.5 fL (ref 80.0–100.0)
Platelets: 156 10*3/uL (ref 150–400)
RBC: 4.86 MIL/uL (ref 4.22–5.81)
RDW: 14.2 % (ref 11.5–15.5)
WBC: 6.4 10*3/uL (ref 4.0–10.5)
nRBC: 0 % (ref 0.0–0.2)

## 2021-01-29 LAB — PROTIME-INR
INR: 1.2 (ref 0.8–1.2)
Prothrombin Time: 15.2 seconds (ref 11.4–15.2)

## 2021-01-29 LAB — SURGICAL PCR SCREEN
MRSA, PCR: NEGATIVE
Staphylococcus aureus: NEGATIVE

## 2021-01-29 LAB — APTT: aPTT: 31 seconds (ref 24–36)

## 2021-01-29 LAB — C-REACTIVE PROTEIN: CRP: 0.7 mg/dL (ref <1.0)

## 2021-01-29 NOTE — Telephone Encounter (Signed)
Left message to call back and ask to speak with pre-op team.  Darreld Mclean, PA-C 01/29/2021 4:51 PM

## 2021-01-29 NOTE — Patient Instructions (Addendum)
Your procedure is scheduled on: 02/08/21 - Friday Report to the Registration Desk on the 1st floor of the [facility]. To find out your arrival time, please call [phone] between 1PM - 3PM on: 02/07/21 - Monday  - Report to [facility] for Covid Test 02/06/21 between 8:00 am and 12:00 pm.   REMEMBER: Instructions that are not followed completely may result in serious medical risk, up to and including death; or upon the discretion of your surgeon and anesthesiologist your surgery may need to be rescheduled.  Do not eat food after midnight the night before surgery.  No gum chewing, lozengers or hard candies.  You may however, drink CLEAR liquids up to 2 hours before you are scheduled to arrive for your surgery. Do not drink anything within 2 hours of your scheduled arrival time.  Clear liquids include: - water  - apple juice without pulp - gatorade (not RED, PURPLE, OR BLUE) - black coffee or tea (Do NOT add milk or creamers to the coffee or tea) Do NOT drink anything that is not on this list.  In addition, your doctor has ordered for you to drink the provided  Ensure Pre-Surgery Clear Carbohydrate Drink  Drinking this carbohydrate drink up to two hours before surgery helps to reduce insulin resistance and improve patient outcomes. Please complete drinking 2 hours prior to scheduled arrival time.  TAKE THESE MEDICATIONS THE MORNING OF SURGERY WITH A SIP OF WATER:  - omeprazole (PRILOSEC) 20 MG capsule, (take one the night before and one on the morning of surgery - helps to prevent nausea after surgery.) - sotalol (BETAPACE) 80 MG tablet   One week prior to surgery: Stop Anti-inflammatories (NSAIDS) such as Advil, Aleve, Ibuprofen, Motrin, Naproxen, Naprosyn and Aspirin based products such as Excedrin, Goodys Powder, BC Powder.  Stop ANY OVER THE COUNTER supplements until after surgery.  You may take Tylenol as directed if needed for pain up until the day of  surgery.  No Alcohol for 24 hours before or after surgery.  No Smoking including e-cigarettes for 24 hours prior to surgery.  No chewable tobacco products for at least 6 hours prior to surgery.  No nicotine patches on the day of surgery.  Do not use any "recreational" drugs for at least a week prior to your surgery.  Please be advised that the combination of cocaine and anesthesia may have negative outcomes, up to and including death. If you test positive for cocaine, your surgery will be cancelled.  On the morning of surgery brush your teeth with toothpaste and water, you may rinse your mouth with mouthwash if you wish. Do not swallow any toothpaste or mouthwash.  Use CHG Soap or wipes as directed on instruction sheet.  Do not wear jewelry, make-up, hairpins, clips or nail polish.  Do not wear lotions, powders, or perfumes.   Do not shave body from the neck down 48 hours prior to surgery just in case you cut yourself which could leave a site for infection.  Also, freshly shaved skin may become irritated if using the CHG soap.  Contact lenses, hearing aids and dentures may not be worn into surgery.  Do not bring valuables to the hospital. [facility] is not responsible for any missing/lost belongings or valuables.   Notify your doctor if there is any change in your medical condition (cold, fever, infection).  Wear comfortable clothing (specific to your surgery type) to the hospital.  After surgery, you can help prevent lung complications  by doing breathing exercises.  Take deep breaths and cough every 1-2 hours. Your doctor may order a device called an Incentive Spirometer to help you take deep breaths. When coughing or sneezing, hold a pillow firmly against your incision with both hands. This is called "splinting." Doing this helps protect your incision. It also decreases belly discomfort.  If you are being admitted to the hospital overnight, leave your suitcase in the  car. After surgery it may be brought to your room.  If you are being discharged the day of surgery, you will not be allowed to drive home. You will need a responsible adult (18 years or older) to drive you home and stay with you that night.   If you are taking public transportation, you will need to have a responsible adult (18 years or older) with you. Please confirm with your physician that it is acceptable to use public transportation.   Please call the [place] Dept. at [phone] if you have any questions about these instructions.  Surgery Visitation Policy:  Patients undergoing a surgery or procedure may have one family member or support person with them as long as that person is not COVID-19 positive or experiencing its symptoms.  That person may remain in the waiting area during the procedure and may rotate out with other people.  Inpatient Visitation:    Visiting hours are 7 a.m. to 8 p.m. Up to two visitors ages 16+ are allowed at one time in a patient room. The visitors may rotate out with other people during the day. Visitors must check out when they leave, or other visitors will not be allowed. One designated support person may remain overnight. The visitor must pass COVID-19 screenings, use hand sanitizer when entering and exiting the patient's room and wear a mask at all times, including in the patient's room. Patients must also wear a mask when staff or their visitor are in the room. Masking is required regardless of vaccination status.

## 2021-01-29 NOTE — Progress Notes (Signed)
[place] DEVICE PROGRAMMING  Patient Information: Name:  Leroy Merritt  DOB:  [birth year]  MRN:  [id number]    Karen Kitchens, NP  P Cv Div Heartcare Device Planned Procedure:  [place]  Surgeon:  Dr. Skip Estimable, MD  Requesting device clearance: Honor Loh, FNP-C  Date of Procedure:  02/08/2021  Cautery will be used.   Please route documentation back me via [facility], or may fax report as per below:  [facility] (PAT APP Fax # [phone])   Honor Loh, MSN, APRN, FNP-C, [place] Nurse Practitioner  Phone: [phone]  01/29/21 1:44 PM  Device Information:  Clinic EP Physician:  Virl Axe, MD   Device Type:  Defibrillator Manufacturer and Phone #:  St. Jude/[employer]ott: [phone] Pacemaker Dependent?:  No. Date of Last Device Check:  11/23/20 (remote) 06/12/20 (in-clinic) Normal Device Function?:  Yes.    Electrophysiologist's Recommendations:  Have magnet available. Provide continuous ECG monitoring when magnet is used or reprogramming is to be performed.  Procedure should not interfere with device function.  No device programming or magnet placement needed.  Per Device Clinic Standing Orders, York Ram, RN  2:27 PM 01/29/2021

## 2021-01-30 NOTE — Telephone Encounter (Signed)
Patient returning call.

## 2021-01-30 NOTE — Telephone Encounter (Signed)
    Patient Name: Leroy Merritt  DOB: 10-19-1945 MRN: [id number]  Primary Cardiologist: Ida Rogue, MD  Chart reviewed as part of pre-operative protocol coverage.  Patient was last seen by Dr. Rockey Situ in  09/2020. He was contacted today for further preop evaluation and reported doing well since last visit. He denies any chest pain, shortness of breath, orthopnea/PND, edema, palpitations, syncope. Activity is limited due to significant hip pain but still able to complete >4.0 METS. Given past medical history and time since last visit, based on ACC/AHA guidelines, TRISTRAM MILIAN would be at acceptable risk for the planned procedure without further cardiovascular testing.   I will route this recommendation to the requesting party via Epic fax function and remove from pre-op pool.  Please call with questions.  Darreld Mclean, PA-C 01/30/2021, 10:31 AM

## 2021-02-04 ENCOUNTER — Encounter: Payer: Self-pay | Admitting: Orthopedic Surgery

## 2021-02-04 NOTE — Progress Notes (Signed)
Perioperative Services  Pre-Admission/Anesthesia Testing Clinical Review  Date: 02/04/21  Patient Demographics:  Name: Leroy Merritt DOB:   August 13, 1945 MRN:   [id number]  Planned Surgical Procedure(s):    Case: [id number] Date/Time: 02/08/21 0715   Procedure: TOTAL HIP ARTHROPLASTY (Right: Hip)   Anesthesia type: Choice   Pre-op diagnosis: PRIMARY OSTEOARTHRITIS OF RIGHT KNEE.   Location: [facility] OR ROOM 03 / [place] ORS FOR ANESTHESIA GROUP   Surgeons: Dereck Leep, MD   NOTE: Available PAT nursing documentation and vital signs have been reviewed. Clinical nursing staff has updated patient's PMH/PSHx, current medication list, and drug allergies/intolerances to ensure comprehensive history available to assist in medical decision making as it pertains to the aforementioned surgical procedure and anticipated anesthetic course. Extensive review of available clinical information performed. [place] PMH and PSHx updated with any diagnoses/procedures that  may have been inadvertently omitted during his intake with the pre-admission testing department's nursing staff.  Clinical Discussion:  Leroy Merritt is a 75 y.o. male who is submitted for pre-surgical anesthesia review and clearance prior to him undergoing the above procedure. Patient has never been a smoker. Pertinent PMH includes: CAD, HFrEF, NICM, AV dissociation, ventricular tachycardia (s/p AICD placement), aortic root/ascending aorta dilatation, HLD, GERD (on daily PPI), BPH, OA.  Patient is followed by cardiology Rockey Situ, MD). He was last seen in the cardiology clinic on 09/12/2020; notes reviewed. At the time of his clinic visit, the patient denied any chest pain, shortness of breath, PND, orthopnea, palpitations, significant peripheral edema, vertiginous symptoms, or presyncope/syncope.  Patient with a PMH significant for cardiovascular diagnoses.  Patient experiencing long runs of sustained ventricular tachycardia with clear A-V  dissociation.  Patient was transferred from cardiology office to [facility] where he underwent further testing and treatment.  Diagnostic left heart catheterization performed on 04/28/2017 revealing severe left ventricular systolic dysfunction with an EF of 25-30%.  There was global hypokinesis.  LVEDP normal at 40 mmHg.  Study did not demonstrate any evidence of obstructive CAD.  TTE performed on 04/29/2017 revealed moderately to severely reduced left ventricular systolic function with an EF of 30-35%.  There was diffuse/global hypokinesis.  Diastolic parameters consistent with abnormal left ventricular relaxation (G1DD). There is no evidence of significant valvular regurgitation or transvalvular gradient suggestive of stenosis.     Cardiac MRI performed on 04/29/2017 demonstrated severe left ventricular enlargement with diffuse hypokinesis; LVEF 27%.  Right ventricle moderately enlarged and hypokinetic.  There was biatrial enlargement.  Patient ultimately underwent placement of a Saint Jude dual-chamber AICD on 04/29/2017.  Last TTE was performed on 03/13/2020 revealing mildly decreased left ventricular systolic function; EF 21-30%.  There was global hypokinesis.  Diastolic Doppler parameters consistent with abnormal laxation (G1DD).  Average GLS abnormal at -12.7%.  Aortic dilatation noted; aortic root measured 3.9 cm and ascending aorta measured 3.7 cm.  Heart failure symptoms currently being managed on ARB/ANRi + diuretic therapies.  Patient with AICD in place that is managed by electrophysiology Caryl Comes, MD); last interrogation revealed <1% tachyarrhythmia burden. Blood pressure well controlled at 120/64 on currently prescribed diuretic, ARB, and beta-blocker therapies.  Patient is not taking any type of lipid-lowering therapies for his HLD or ASCVD prevention.  He is not diabetic. Functional capacity, as defined by DASI, is documented as being >/= 4 METS.  No changes were made to his  medication regimen.  Patient to follow-up with outpatient cardiology in 1 year or sooner if needed.  Leroy Merritt is scheduled for  an elective RIGHT TOTAL HIP ARTHROPLASTY on 02/08/2021 with Dr. Skip Estimable, MD. Given patient's past medical history significant for cardiovascular diagnoses, presurgical cardiac clearance was sought by the PAT team. Per cardiology, "based ACC/AHA guidelines, the patient's past medical history, and the amount of time since his last clinic visit, this patient would be at an overall ACCEPTABLE risk for the planned procedure without further cardiovascular testing or intervention at this time".  In review of his medication reconciliation, it is noted the patient is not currently taking any type of anticoagulation/antiplatelet medications that will need to be held during the perioperative period.  Patient denies previous perioperative complications with anesthesia in the past. In review of the available records, it is noted that patient underwent a MAC anesthetic course here (ASA III) in 05/2019 without documented complications.   Vitals with BMI 01/29/2021 12/18/2020 12/18/2020  Height _0  - _1   Weight 179 lbs - 181 lbs  BMI 85.46 - 27.03  Systolic 500 938 182  Diastolic 90 95 93  Pulse 67 - 60    Providers/Specialists:   NOTE: Primary physician provider listed below. Patient may have been seen by APP or partner within same practice.   PROVIDER ROLE / SPECIALTY LAST OV  Dereck Leep, MD Orthopedics 01/28/2021  Valerie Roys, DO Primary Care Provider 12/18/2020  Ida Rogue, MD Cardiology 09/12/2020   Allergies:  Patient has no known allergies.  Current Home Medications:   No current facility-administered medications for this encounter.    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   Cataract    Diverticulosis 2017   Dysphagia 12/20/2014   GERD (gastroesophageal reflux disease)    HFrEF (heart failure with reduced ejection fraction) ([place]) 04/29/2017   a.) TTE 04/29/2017: mod-sev LV ysfunction; EF 30-35%; global HK; G1DD. b.) Cardiac MRI 04/29/2017: biventricular/biatrial enlargement; LVEF 27%; global HK.   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  Status post SJM dual-chamber ICD in 04/2017   Past Surgical History:  Procedure Laterality Date   CARDIAC CATHETERIZATION  04/28/2017   CATARACT EXTRACTION W/ INTRAOCULAR LENS IMPLANT Right 2017   CATARACT EXTRACTION W/PHACO Left 05/20/2019   Procedure: CATARACT EXTRACTION PHACO AND INTRAOCULAR LENS PLACEMENT ([place]) LEFT;  Surgeon: Birder Robson, MD;  Location: [facility] ORS;  Service: Ophthalmology;  Laterality: Left;  [place] 01:19.7 CDE 14.58 Fluid Pack Lot # [id number]   ESOPHAGOGASTRODUODENOSCOPY (EGD) WITH PROPOFOL N/A 02/19/2015   Procedure: ESOPHAGOGASTRODUODENOSCOPY (EGD) WITH PROPOFOL and esophageal dilation.;  Surgeon: Lucilla Lame, MD;  Location: [facility];  Service: Endoscopy;  Laterality: N/A;  EYE SURGERY     ICD IMPLANT N/A 04/29/2017   Procedure: ICD IMPLANT;  Surgeon: Evans Lance, MD;  Location: [facility] CV LAB;  Service: Cardiovascular;  Laterality: N/A;   LEFT HEART CATH AND CORONARY ANGIOGRAPHY N/A 04/28/2017   Procedure: LEFT HEART CATH AND CORONARY ANGIOGRAPHY;  Surgeon: [place], Peter M, MD;  Location: [facility] CV LAB;  Service: Cardiovascular;  Laterality: N/A;   PROSTATE BIOPSY  ~ 2012   RETINAL DETACHMENT SURGERY Right 1990s   "torn retina"   Family History  Problem Relation Age of Onset   Arthritis Mother    Diabetes Father    Hypertension Father    Heart disease Brother    Hyperlipidemia Brother    Hypertension Brother    Lung cancer Daughter    Cancer Daughter    Social History   Tobacco Use   Smoking status: Never   Smokeless tobacco: Never  Vaping Use   Vaping Use: Never used  Substance Use Topics   Alcohol use: Yes    Alcohol/week: 1.0 standard drink    Types: 1 Glasses of wine per week    Comment: 1 Drink/Week   Drug use: No    Pertinent Clinical Results:  LABS: Labs reviewed: Acceptable for surgery.  No visits with results within 3 Day(s) from this visit.  Latest known visit with results is:  Hospital Outpatient Visit on 01/29/2021  Component Date Value Ref Range Status   CRP 01/29/2021 0.7  <1.0 mg/dL Final   Performed at [facility] [address] [place], [place] [address]   MRSA, PCR 01/29/2021 NEGATIVE  NEGATIVE Final   Staphylococcus aureus 01/29/2021 NEGATIVE  NEGATIVE Final   Comment: (NOTE) The Xpert SA Assay (FDA approved for NASAL specimens in patients 80 years of age and older), is one component of a comprehensive surveillance program. It is not intended to diagnose infection nor to guide or monitor treatment. Performed at [facility], [address] [place], [place] [address]   Sed Rate 01/29/2021 4  0 - 20 mm/hr Final   Performed at [facility], [address] [place], [place] [address]   WBC 01/29/2021 6.4  4.0 - 10.5 K/uL Final   RBC 01/29/2021 4.86  4.22 - 5.81  MIL/uL Final   Hemoglobin 01/29/2021 14.6  13.0 - 17.0 g/dL Final   HCT 01/29/2021 44.0  39.0 - 52.0 % Final   MCV 01/29/2021 90.5  80.0 - 100.0 fL Final   MCH 01/29/2021 30.0  26.0 - 34.0 pg Final   MCHC 01/29/2021 33.2  30.0 - 36.0 g/dL Final   RDW 01/29/2021 14.2  11.5 - 15.5 % Final   Platelets 01/29/2021 156  150 - 400 K/uL Final   nRBC 01/29/2021 0.0  0.0 - 0.2 % Final   Performed at [facility], [address] [place], [place] [address]   Sodium 01/29/2021 139  135 - 145 mmol/L Final   Potassium 01/29/2021 3.9  3.5 - 5.1 mmol/L Final   Chloride 01/29/2021 107  98 - 111 mmol/L Final   CO2 01/29/2021 25  22 - 32 mmol/L Final   Glucose, Bld 01/29/2021 92  70 - 99 mg/dL Final   Glucose reference range applies only to samples taken after fasting for at least 8 hours.   BUN 01/29/2021 18  8 - 23 mg/dL Final   Creatinine, Ser 01/29/2021 1.09  0.61 - 1.24 mg/dL Final   Calcium 01/29/2021 9.0  8.9 -  10.3 mg/dL Final   Total Protein 01/29/2021 6.8  6.5 - 8.1 g/dL Final   Albumin 01/29/2021 3.7  3.5 - 5.0 g/dL Final   AST 01/29/2021 16  15 - 41 U/L Final   ALT 01/29/2021 13  0 - 44 U/L Final   Alkaline Phosphatase 01/29/2021 76  38 - 126 U/L Final   Total Bilirubin 01/29/2021 0.8  0.3 - 1.2 mg/dL Final   GFR, Estimated 01/29/2021 >60  >60 mL/min Final   Comment: (NOTE) Calculated using the CKD-EPI Creatinine Equation (2021)   Anion gap 01/29/2021 7  5 - 15 Final   Performed at [facility], [address] [place], [place] [address]   Prothrombin Time 01/29/2021 15.2  11.4 - 15.2 seconds Final   INR 01/29/2021 1.2  0.8 - 1.2 Final   Comment: (NOTE) INR goal varies based on device and disease states. Performed at [facility], [address] [place], [place] [address]    aPTT 01/29/2021 31  24 - 36 seconds Final   Performed at [facility], [place], [place] [address]   Color, Urine 01/29/2021 YELLOW (A)  YELLOW Final    APPearance 01/29/2021 HAZY (A)  CLEAR Final   Specific Gravity, Urine 01/29/2021 1.018  1.005 - 1.030 Final   pH 01/29/2021 5.0  5.0 - 8.0 Final   Glucose, UA 01/29/2021 NEGATIVE  NEGATIVE mg/dL Final   Hgb urine dipstick 01/29/2021 NEGATIVE  NEGATIVE Final   Bilirubin Urine 01/29/2021 NEGATIVE  NEGATIVE Final   Ketones, ur 01/29/2021 NEGATIVE  NEGATIVE mg/dL Final   Protein, ur 01/29/2021 NEGATIVE  NEGATIVE mg/dL Final   Nitrite 01/29/2021 NEGATIVE  NEGATIVE Final   Leukocytes,Ua 01/29/2021 NEGATIVE  NEGATIVE Final   Performed at [facility], [address] [place], [place] [address]   ABO/RH(D) 01/29/2021 A NEG   Final   Antibody Screen 01/29/2021 NEG   Final   Sample Expiration 01/29/2021 02/12/2021,2359   Final   Extend sample reason 01/29/2021    Final                   Value:NO TRANSFUSIONS OR PREGNANCY IN THE PAST 3 MONTHS Performed at [facility], [address] [place], [place] [address]     ECG: Date: 01/29/2021 Time ECG obtained: 1210 PM Rate: 63 bpm Rhythm:  Sinus rhythm with first-degree AV block Axis (leads I and aVF): Left axis deviation Intervals: PR 256 ms. QRS 92 ms. QTc 442 ms. ST segment and T wave changes: No evidence of acute ST segment elevation or depression Comparison: Previous tracing on 04/30/2017 showed paced rhythm   IMAGING / PROCEDURES: TRANSTHORACIC ECHOCARDIOGRAM performed on 03/13/2020 Left ventricular ejection fraction, by estimation, is 45 to 50%. The left ventricle has mildly decreased function. The left ventricle  demonstrates global hypokinesis. There is mild left ventricular hypertrophy. Left ventricular diastolic parameters are consistent with Grade I diastolic dysfunction (impaired relaxation). The average left ventricular global longitudinal strain is -12.7 %. The global longitudinal strain is abnormal.  Right ventricular systolic function is normal. The right ventricular size is normal. There is normal pulmonary  artery systolic pressure.  The mitral valve is normal in structure. Mild mitral valve regurgitation. No evidence of mitral stenosis.  The aortic valve has an indeterminant number of cusps. There is mild thickening of the aortic valve. Aortic valve regurgitation is not visualized. Mild aortic valve sclerosis is present, with no evidence of  aortic valve stenosis.  Aortic dilatation  noted. 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   MRI CARDIAC MORPHOLOGY W WO CONTRAST performed on 04/29/2017 Severe LVE with diffuse hypokinesis; LVEF 27% Minimal delayed gadolinium uptake in the basal inferior wall and septum Moderate right ventricular enlargement and hypokinesis with no evidence of right ventricular dysplasia Biatrial enlargement  LEFT HEART CATHETERIZATION AND CORONARY ANGIOGRAPHY performed on 04/28/2017 Nonobstructive CAD severe left ventricular systolic dysfunction Global hypokinesis with an EF of 25-30% Normal LVEDP of 14 mmHg  Impression and Plan:  Leroy Merritt has been referred for pre-anesthesia review and clearance prior to him undergoing the planned anesthetic and procedural courses. Available labs, pertinent testing, and imaging results were personally reviewed by me. This patient has been appropriately cleared by cardiology with an overall ACCEPTABLE risk of significant perioperative cardiovascular complications. Completed perioperative prescription for cardiac device management documentation completed by primary cardiology team and placed on patient's chart for review by the surgical/anesthetic team on the day of his procedure.   Based on clinical review performed today (02/04/21), barring any significant acute changes in the patient's overall condition, it is anticipated that he will be able to proceed with the planned surgical intervention. Any acute changes in clinical condition may necessitate his  procedure being postponed and/or cancelled. Patient will meet with anesthesia team (MD and/or CRNA) on the day of his procedure for preoperative evaluation/assessment. Questions regarding anesthetic course will be fielded at that time.   Pre-surgical instructions were reviewed with the patient during his PAT appointment and questions were fielded by PAT clinical staff. Patient was advised that if any questions or concerns arise prior to his procedure then he should return a call to PAT and/or his surgeon's office to discuss.  Honor Loh, MSN, APRN, FNP-C, CEN [facility]  Peri-operative Services Nurse Practitioner Phone: [phone] Fax: [phone] 02/04/21 9:20 AM  NOTE: This note has been prepared using Dragon dictation software. Despite my best ability to proofread, there is always the potential that unintentional transcriptional errors may still occur from this process.

## 2021-02-06 ENCOUNTER — Other Ambulatory Visit
Admission: RE | Admit: 2021-02-06 | Discharge: 2021-02-06 | Disposition: A | Discharge status: Home / Self Care | Payer: Medicare HMO | Source: Ambulatory Visit | Attending: Orthopedic Surgery | Admitting: Orthopedic Surgery

## 2021-02-06 ENCOUNTER — Other Ambulatory Visit: Payer: Self-pay

## 2021-02-06 DIAGNOSIS — U071 COVID-19: Secondary | ICD-10-CM | POA: Insufficient documentation

## 2021-02-06 DIAGNOSIS — Z20822 Contact with and (suspected) exposure to covid-19: Secondary | ICD-10-CM

## 2021-02-06 DIAGNOSIS — Z01812 Encounter for preprocedural laboratory examination: Secondary | ICD-10-CM | POA: Diagnosis not present

## 2021-02-06 DIAGNOSIS — Z1152 Encounter for screening for COVID-19: Secondary | ICD-10-CM

## 2021-02-07 LAB — SARS CORONAVIRUS 2 (TAT 6-24 HRS): SARS Coronavirus 2: POSITIVE — AB

## 2021-02-08 DIAGNOSIS — I5022 Chronic systolic (congestive) heart failure: Secondary | ICD-10-CM

## 2021-02-08 DIAGNOSIS — I420 Dilated cardiomyopathy: Secondary | ICD-10-CM

## 2021-02-08 DIAGNOSIS — K2100 Gastro-esophageal reflux disease with esophagitis, without bleeding: Secondary | ICD-10-CM

## 2021-02-08 DIAGNOSIS — I472 Ventricular tachycardia, unspecified: Secondary | ICD-10-CM

## 2021-02-08 DIAGNOSIS — I8393 Asymptomatic varicose veins of bilateral lower extremities: Secondary | ICD-10-CM

## 2021-02-08 DIAGNOSIS — M1611 Unilateral primary osteoarthritis, right hip: Secondary | ICD-10-CM

## 2021-02-08 DIAGNOSIS — Z95810 Presence of automatic (implantable) cardiac defibrillator: Secondary | ICD-10-CM

## 2021-02-08 DIAGNOSIS — I440 Atrioventricular block, first degree: Secondary | ICD-10-CM

## 2021-02-22 ENCOUNTER — Ambulatory Visit (INDEPENDENT_AMBULATORY_CARE_PROVIDER_SITE_OTHER): Payer: Medicare HMO

## 2021-02-22 DIAGNOSIS — I420 Dilated cardiomyopathy: Secondary | ICD-10-CM

## 2021-02-22 LAB — CUP PACEART REMOTE DEVICE CHECK
Battery Remaining Longevity: 59 mo
Battery Remaining Percentage: 61 %
Battery Voltage: 2.96 V
Brady Statistic AP VP Percent: 1 %
Brady Statistic AP VS Percent: 4.1 %
Brady Statistic AS VP Percent: 3.1 %
Brady Statistic AS VS Percent: 91 %
Brady Statistic RA Percent Paced: 3.6 %
Brady Statistic RV Percent Paced: 3.9 %
Date Time Interrogation Session: 20221216040020
HighPow Impedance: 80 Ohm
HighPow Impedance: 80 Ohm
Implantable Lead Implant Date: 20190220
Implantable Lead Implant Date: 20190220
Implantable Lead Location: 753859
Implantable Lead Location: 753860
Implantable Lead Model: 7122
Implantable Pulse Generator Implant Date: 20190220
Lead Channel Impedance Value: 390 Ohm
Lead Channel Impedance Value: 440 Ohm
Lead Channel Pacing Threshold Amplitude: 0.75 V
Lead Channel Pacing Threshold Amplitude: 1 V
Lead Channel Pacing Threshold Pulse Width: 0.5 ms
Lead Channel Pacing Threshold Pulse Width: 0.5 ms
Lead Channel Sensing Intrinsic Amplitude: 1.7 mV
Lead Channel Sensing Intrinsic Amplitude: 11.8 mV
Lead Channel Setting Pacing Amplitude: 2 V
Lead Channel Setting Pacing Amplitude: 2.5 V
Lead Channel Setting Pacing Pulse Width: 0.5 ms
Lead Channel Setting Sensing Sensitivity: 0.5 mV
Pulse Gen Serial Number: 9787528

## 2021-03-05 ENCOUNTER — Other Ambulatory Visit: Payer: Self-pay

## 2021-03-05 ENCOUNTER — Encounter: Payer: Self-pay | Admitting: Internal Medicine

## 2021-03-05 ENCOUNTER — Ambulatory Visit (INDEPENDENT_AMBULATORY_CARE_PROVIDER_SITE_OTHER): Payer: Medicare HMO | Admitting: Internal Medicine

## 2021-03-05 VITALS — BP 114/82 | HR 77 | Ht 70.0 in | Wt 182.4 lb

## 2021-03-05 DIAGNOSIS — I5022 Chronic systolic (congestive) heart failure: Secondary | ICD-10-CM

## 2021-03-05 DIAGNOSIS — I472 Ventricular tachycardia, unspecified: Secondary | ICD-10-CM | POA: Diagnosis not present

## 2021-03-05 DIAGNOSIS — I428 Other cardiomyopathies: Secondary | ICD-10-CM

## 2021-03-05 DIAGNOSIS — Z79899 Other long term (current) drug therapy: Secondary | ICD-10-CM

## 2021-03-05 DIAGNOSIS — I440 Atrioventricular block, first degree: Secondary | ICD-10-CM

## 2021-03-05 DIAGNOSIS — I420 Dilated cardiomyopathy: Secondary | ICD-10-CM

## 2021-03-05 DIAGNOSIS — Z95810 Presence of automatic (implantable) cardiac defibrillator: Secondary | ICD-10-CM

## 2021-03-05 NOTE — Progress Notes (Signed)
Patient Care Team: Valerie Roys, DO as PCP - General (Family Medicine) [name], MD as PCP - Cardiology (Cardiology) Deboraha Sprang, MD as PCP - Electrophysiology (Cardiology) [name], MD as Consulting Physician (Cardiology) Deboraha Sprang, MD as Consulting Physician (Cardiology)   HPI  Leroy Merritt is a 76 y.o. 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 No genetic testing; noted to have significant LV dysfunction and mild RV enlargement.  Interval improvement in LV function (See Below)    Seen initially 7/20 with complaints of fatigue.  Sotalol dose was decreased from 160--80; also started on transition to [facility] and addition of spironolactone- >>> eplerenone  2/2 gynecomastia   The patient denies chest pain, nocturnal dyspnea, orthopnea or peripheral edema.  There have been no palpitations, lightheadedness or syncope.  Complains of dyspnea which he attributes to his hip for which he is scheduled to undergo surgery in the next couple of weeks.   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   Cataract     Diverticulosis 2017   Dysphagia 12/20/2014   GERD (gastroesophageal reflux disease)    HFrEF (heart failure with reduced ejection fraction) ([place]) 04/29/2017   a.) TTE 04/29/2017: mod-sev LV ysfunction; EF 30-35%; global HK; G1DD. b.) Cardiac MRI 04/29/2017: biventricular/biatrial enlargement; LVEF 27%; global HK.   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  Status post SJM dual-chamber ICD in 04/2017    Past Surgical History:  Procedure Laterality Date   CARDIAC CATHETERIZATION  04/28/2017   CATARACT EXTRACTION W/ INTRAOCULAR LENS IMPLANT Right 2017   CATARACT EXTRACTION W/PHACO Left 05/20/2019   Procedure: CATARACT EXTRACTION PHACO AND INTRAOCULAR LENS PLACEMENT ([place]) LEFT;  Surgeon: Birder Robson, MD;  Location: [facility] ORS;  Service: Ophthalmology;  Laterality: Left;  [place] 01:19.7 CDE 14.58 Fluid Pack Lot # [id number]   ESOPHAGOGASTRODUODENOSCOPY (EGD) WITH PROPOFOL N/A 02/19/2015   Procedure: ESOPHAGOGASTRODUODENOSCOPY (EGD) WITH PROPOFOL and esophageal dilation.;  Surgeon: Lucilla Lame, MD;  Location: [place];  Service: Endoscopy;  Laterality: N/A;   EYE SURGERY     ICD IMPLANT N/A 04/29/2017   Procedure: ICD IMPLANT;  Surgeon: Evans Lance, MD;  Location: [place] CV LAB;  Service: Cardiovascular;  Laterality: N/A;   LEFT HEART CATH AND CORONARY ANGIOGRAPHY  N/A 04/28/2017   Procedure: LEFT HEART CATH AND CORONARY ANGIOGRAPHY;  Surgeon: Martinique, Peter M, MD;  Location: [place] CV LAB;  Service: Cardiovascular;  Laterality: N/A;   PROSTATE BIOPSY  ~ 2012   RETINAL DETACHMENT SURGERY Right 1990s   "torn retina"    Current Meds  Medication Sig   diphenhydramine-acetaminophen (TYLENOL PM) 25-500 MG TABS tablet Take 1 tablet by mouth at bedtime as needed (sleep).   ENTRESTO 49-51 MG TAKE ONE (1) TABLET BY MOUTH TWO TIMES PER DAY   eplerenone (INSPRA)  25 MG tablet TAKE ONE TABLET BY MOUTH DAILY   finasteride (PROSCAR) 5 MG tablet Take 1 tablet (5 mg total) by mouth at bedtime.   Multiple Vitamins-Minerals (MULTIVITAMIN ADULT PO) Take 1 tablet by mouth every morning.    omeprazole (PRILOSEC) 20 MG capsule Take 1 capsule (20 mg total) by mouth daily.   sotalol (BETAPACE) 80 MG tablet TAKE 1 TABLET TWICE DAILY   tamsulosin (FLOMAX) 0.4 MG CAPS capsule Take 1 capsule (0.4 mg total) by mouth daily.    No Known Allergies    Review of Systems negative except from HPI and PMH  Physical Exam BP 114/82    Pulse 77    Ht 5\' 10"  (1.778 m)    Wt 182 lb 6.4 oz (82.7 kg)    SpO2 90%    BMI 26.17 kg/m  Well developed and well nourished in no acute distress HENT normal Neck supple with JVP-flat Clear Device pocket well healed; without hematoma or erythema.  There is no tethering  Regular rate and rhythm, no  gallop No  murmur diminished S1 Abd-soft with active BS No Clubbing cyanosis   edema Skin-warm and dry A & Oriented  Grossly normal sensory and motor function  ECG   sinus at 77 Intervals 28/08/37 Left axis deviation -61  Assessment and  Plan  Nonischemic cardiomyopathy    Ventricular tachycardia--PVCs frequent   Implantable defibrillator-secondary prevention  St Jude   Orthostatic intolerance  High Risk Medication Surveillance sotalol., aldactone  Fatigue  First-degree AV block  PVC burden per his device is less than 1% we will continue his sotalol 80 mg twice daily; last potassium was 3.9.  We will arrange for labs to be drawn at the time of his surgery including his magnesium  No intercurrent ventricular tachycardia, continue sotalol as above  Cardiomyopathy remains an issue.  Did not tolerate [place].  We will continue him on Entresto 49/51 and eplerenone 25 conjunction with his beta-blocker   First-degree AV block is stable, we will need to reassess history of following his surgery when he can walk reasonably  He is  euvolemic.  We will continue his eplerenone 25 daily.     Current medicines are reviewed at length with the patient today .  The patient does not  have concerns regarding medicines.

## 2021-03-05 NOTE — Patient Instructions (Signed)
Medication Instructions:   Your physician recommends that you continue on your current medications as directed. Please refer to the Current Medication list given to you today.  *If you need a refill on your cardiac medications before your next appointment, please call your pharmacy*   Lab Work:  None ordered today  We have given you a lab order slip to make sure you have Magnesium checked with upcoming labs  Testing/Procedures:  None ordered   Follow-Up: At [facility], you and your health needs are our priority.  As part of our continuing mission to provide you with exceptional heart care, we have created designated Provider Care Teams.  These Care Teams include your primary Cardiologist (physician) and Advanced Practice Providers (APPs -  Physician Assistants and Nurse Practitioners) who all work together to provide you with the care you need, when you need it.  We recommend signing up for the patient portal called "MyChart".  Sign up information is provided on this After Visit Summary.  MyChart is used to connect with patients for Virtual Visits (Telemedicine).  Patients are able to view lab/test results, encounter notes, upcoming appointments, etc.  Non-urgent messages can be sent to your provider as well.   To learn more about what you can do with MyChart, go to [email or web address].    Your next appointment:   6 month(s)  The format for your next appointment:   In Person  Provider:   You may see Dr. Caryl Comes

## 2021-03-06 NOTE — Progress Notes (Signed)
Remote ICD transmission.   

## 2021-03-08 ENCOUNTER — Inpatient Hospital Stay: Admission: RE | Admit: 2021-03-08 | Payer: Medicare HMO | Source: Ambulatory Visit

## 2021-03-10 DIAGNOSIS — N35919 Unspecified urethral stricture, male, unspecified site: Secondary | ICD-10-CM

## 2021-03-10 HISTORY — DX: Unspecified urethral stricture, male, unspecified site: N35.919

## 2021-03-12 NOTE — Pre-Procedure Instructions (Signed)
Spoke with patient on the phone.  No changes in his medical condition or medication since his surgery was cancelled last month due to being Covid positive.  He understands his instructions and the medication he should take the morning of surgery.  We will repeat his T&S the morning of surgery and he has an appointment for a H&P with [facility] clinic prior to the surgery.

## 2021-03-13 ENCOUNTER — Other Ambulatory Visit: Payer: Medicare HMO

## 2021-03-17 NOTE — H&P (Signed)
ORTHOPAEDIC HISTORY & PHYSICAL Gwenlyn Fudge, [place] - 03/15/2021 8:15 AM EST Formatting of this note is different from the original. [place] MEDICINE Chief Complaint:   Chief Complaint  Patient presents with   Pre-op Exam  Scheduled for Right THA 03/20/21 with Dr. Marry Guan   History of Present Illness:   Leroy Merritt is a 76 y.o. male that presents to clinic today for his preoperative history and evaluation. Patient presents with his wife. The patient is scheduled to undergo a right total hip arthroplasty on 03/20/21 by Dr. Marry Guan. His pain began 6 months ago. The pain is located in the right hip and groin. He describes his pain as worse with weightbearing. He denies associated numbness or tingling.   The patient's symptoms have progressed to the point that they decrease his quality of life. The patient has previously undergone conservative treatment including NSAIDS and activity modification without adequate control of his symptoms.  Patient has received cardiac clearance from Dr Caryl Comes. No history of blood clots, lumbar surgery. No known drug allergies. His wife will be assisting at home after surgery.   Past Medical, Surgical, Family, Social History, Allergies, Medications:   Past Medical History:  Past Medical History:  Diagnosis Date   BPH (benign prostatic hyperplasia)   Dilated cardiomyopathy (CMS-HCC)   Diverticulosis   Dysrhythmia   Heart disease   History of chicken pox   Past Surgical History:  Past Surgical History:  Procedure Laterality Date   INSERTION DUAL CHAMBER PACEMAKER GENERATOR   UPPER GASTROINTESTINAL ENDOSCOPY   Current Medications:  Current Outpatient Medications  Medication Sig Dispense Refill   eplerenone (INSPRA) 25 MG tablet Take 25 mg by mouth once daily   finasteride (PROSCAR) 5 mg tablet Take 5 mg by mouth once daily.   multivitamin tablet Take 1 tablet by mouth once daily.   omeprazole (PRILOSEC) 20 MG  DR capsule Take 20 mg by mouth once daily   sacubitriL-valsartan (ENTRESTO) 24-26 mg tablet Entresto 24 mg-26 mg tablet TK 1 T PO BID   sotaloL (BETAPACE) 80 MG tablet Take 80 mg by mouth every 12 (twelve) hours   tamsulosin (FLOMAX) 0.4 mg capsule Take 0.4 mg by mouth once daily. Take 30 minutes after same meal each day.   No current facility-administered medications for this visit.   Allergies: No Known Allergies  Social History:  Social History   Socioeconomic History   Marital status: Married  Tobacco Use   Smoking status: Never   Smokeless tobacco: Never  Substance and Sexual Activity   Alcohol use: Yes  Alcohol/week: 1.0 standard drink  Types: 1 Cans of beer per week   Drug use: No   Sexual activity: Defer   Family History:  Family History  Problem Relation Age of Onset   Arthritis Mother   High blood pressure (Hypertension) Father   Diabetes type II Father   Heart disease Brother   Review of Systems:   A 10+ ROS was performed, reviewed, and the pertinent orthopaedic findings are documented in the HPI.   Physical Examination:   BP 118/70   Ht 177.8 cm (5\' 10" )   Wt 83.4 kg (183 lb 12.8 oz)   BMI 26.37 kg/m   Patient is a well-developed, well-nourished male in no acute distress. Patient has normal mood and affect. Patient is alert and oriented to person, place, and time.   HEENT: Atraumatic, normocephalic. Pupils equal and reactive to light. Extraocular motion intact. Noninjected sclera.  Cardiovascular:  Regular rate and rhythm, with no murmurs, rubs, or gallops. Distal pulses palpable.  Respiratory: Lungs clear to auscultation bilaterally.   Right Hip: Pelvic tilt: Negative Limb lengths: Equal with the patient standing Soft tissue swelling: Negative Erythema: Negative Crepitance: Positive Tenderness: Greater trochanter is nontender to palpation. Moderate pain is elicited by axial compression or extremes of rotation. Atrophy: No atrophy. Fair to good hip  flexor and abductor strength. Range of Motion: EXT/FLEX: 0/0/100 ADD/ABD: 20/0/20 IR/ER: 10/0/30  Sensation is intact to light touch over the saphenous, lateral cutaneous, superficial fibular, and deep fibular nerve distributions.  Tests Performed/Reviewed:  X-rays  No new radiographs were obtained today. Previous radiographs were reviewed of the right hip and revealed complete loss of superior femoral acetabular joint space with deformation of the femoral head and subchondral sclerosis noted. No fractures or other osseous abnormality noted.  Impression:   ICD-10-CM  1. Primary osteoarthritis of right hip M16.11   Plan:   The patient has end-stage degenerative changes of the right hip. It was explained to the patient that the condition is progressive in nature. Having failed conservative treatment, the patient has elected to proceed with a total joint arthroplasty. The patient will undergo a total joint arthroplasty with Dr. Marry Guan. The risks of surgery, including blood clot and infection, were discussed with the patient. Measures to reduce these risks, including the use of anticoagulation, perioperative antibiotics, and early ambulation were discussed. The importance of postoperative physical therapy was discussed with the patient. The patient elects to proceed with surgery. The patient is instructed to stop all blood thinners prior to surgery. The patient is instructed to call the hospital the day before surgery to learn of the proper arrival time.   Contact our office with any questions or concerns. Follow up as indicated, or sooner should any new problems arise, if conditions worsen, or if they are otherwise concerned.   Gwenlyn Fudge, PA-C [facility] [address] [place], [place] [address] Phone: [phone]  This note was generated in part with voice recognition software and I apologize for any typographical errors that were not  detected and corrected.  Electronically signed by Gwenlyn Fudge, PA at 03/15/2021 8:55 AM EST

## 2021-03-18 ENCOUNTER — Other Ambulatory Visit: Payer: Medicare HMO

## 2021-03-20 ENCOUNTER — Other Ambulatory Visit: Payer: Self-pay

## 2021-03-20 ENCOUNTER — Encounter: Payer: Self-pay | Admitting: Orthopedic Surgery

## 2021-03-20 ENCOUNTER — Encounter
Admission: RE | Disposition: A | Discharge status: Home / Self Care | Payer: Self-pay | Source: Ambulatory Visit | Attending: Orthopedic Surgery

## 2021-03-20 ENCOUNTER — Ambulatory Visit: Payer: Medicare HMO | Admitting: Urgent Care

## 2021-03-20 ENCOUNTER — Observation Stay: Payer: Medicare HMO

## 2021-03-20 ENCOUNTER — Observation Stay
Admission: RE | Admit: 2021-03-20 | Discharge: 2021-03-21 | Disposition: A | Discharge status: Home / Self Care | Payer: Medicare HMO | Source: Ambulatory Visit | Attending: Orthopedic Surgery | Admitting: Orthopedic Surgery

## 2021-03-20 DIAGNOSIS — M7989 Other specified soft tissue disorders: Secondary | ICD-10-CM | POA: Diagnosis not present

## 2021-03-20 DIAGNOSIS — I472 Ventricular tachycardia, unspecified: Secondary | ICD-10-CM

## 2021-03-20 DIAGNOSIS — N35912 Unspecified bulbous urethral stricture, male: Secondary | ICD-10-CM

## 2021-03-20 DIAGNOSIS — I5022 Chronic systolic (congestive) heart failure: Secondary | ICD-10-CM

## 2021-03-20 DIAGNOSIS — I420 Dilated cardiomyopathy: Secondary | ICD-10-CM

## 2021-03-20 DIAGNOSIS — M1611 Unilateral primary osteoarthritis, right hip: Principal | ICD-10-CM

## 2021-03-20 DIAGNOSIS — Z79899 Other long term (current) drug therapy: Secondary | ICD-10-CM | POA: Insufficient documentation

## 2021-03-20 DIAGNOSIS — K2100 Gastro-esophageal reflux disease with esophagitis, without bleeding: Secondary | ICD-10-CM

## 2021-03-20 DIAGNOSIS — K219 Gastro-esophageal reflux disease without esophagitis: Secondary | ICD-10-CM | POA: Diagnosis not present

## 2021-03-20 DIAGNOSIS — N400 Enlarged prostate without lower urinary tract symptoms: Secondary | ICD-10-CM | POA: Insufficient documentation

## 2021-03-20 DIAGNOSIS — I8393 Asymptomatic varicose veins of bilateral lower extremities: Secondary | ICD-10-CM

## 2021-03-20 DIAGNOSIS — I440 Atrioventricular block, first degree: Secondary | ICD-10-CM

## 2021-03-20 DIAGNOSIS — Z95810 Presence of automatic (implantable) cardiac defibrillator: Secondary | ICD-10-CM

## 2021-03-20 DIAGNOSIS — Z96641 Presence of right artificial hip joint: Secondary | ICD-10-CM

## 2021-03-20 DIAGNOSIS — Z96649 Presence of unspecified artificial hip joint: Secondary | ICD-10-CM

## 2021-03-20 HISTORY — DX: Atherosclerotic heart disease of native coronary artery without angina pectoris: I25.10

## 2021-03-20 HISTORY — DX: Other cardiomyopathies: I42.8

## 2021-03-20 HISTORY — PX: TOTAL HIP ARTHROPLASTY: SHX124

## 2021-03-20 LAB — TYPE AND SCREEN
ABO/RH(D): A NEG
Antibody Screen: NEGATIVE

## 2021-03-20 SURGERY — ARTHROPLASTY, HIP, TOTAL,POSTERIOR APPROACH
Anesthesia: Spinal | Site: Hip | Laterality: Right

## 2021-03-20 MED ORDER — CEFAZOLIN SODIUM-DEXTROSE 2-4 GM/100ML-% IV SOLN
INTRAVENOUS | Status: AC
  Administered 2021-03-20: 2 g via INTRAVENOUS
  Filled 2021-03-20: qty 100

## 2021-03-20 MED ORDER — GABAPENTIN 300 MG PO CAPS
ORAL_CAPSULE | ORAL | Status: AC
  Administered 2021-03-20: 300 mg via ORAL
  Filled 2021-03-20: qty 1

## 2021-03-20 MED ORDER — 0.9 % SODIUM CHLORIDE (POUR BTL) OPTIME
TOPICAL | Status: DC | PRN
  Administered 2021-03-20: 200 mL

## 2021-03-20 MED ORDER — MAGNESIUM HYDROXIDE 400 MG/5ML PO SUSP
ORAL | Status: AC
  Filled 2021-03-20: qty 30

## 2021-03-20 MED ORDER — OXYCODONE HCL 5 MG PO TABS
ORAL_TABLET | ORAL | Status: AC
  Filled 2021-03-20: qty 2

## 2021-03-20 MED ORDER — CHLORHEXIDINE GLUCONATE 4 % EX LIQD
60.0000 mL | Freq: Once | CUTANEOUS | Status: AC
  Administered 2021-03-20: 4 via TOPICAL

## 2021-03-20 MED ORDER — ACETAMINOPHEN 10 MG/ML IV SOLN
1000.0000 mg | Freq: Four times a day (QID) | INTRAVENOUS | Status: AC
  Administered 2021-03-20 – 2021-03-21 (×2): 1000 mg via INTRAVENOUS
  Filled 2021-03-20: qty 100

## 2021-03-20 MED ORDER — ORAL CARE MOUTH RINSE: 15.0000 mL | Freq: Once | OROMUCOSAL | Status: AC

## 2021-03-20 MED ORDER — BUPIVACAINE HCL (PF) 0.5 % IJ SOLN
INTRAMUSCULAR | Status: DC | PRN
  Administered 2021-03-20: 3 mL

## 2021-03-20 MED ORDER — CHLORHEXIDINE GLUCONATE 0.12 % MT SOLN: 15.0000 mL | Freq: Once | OROMUCOSAL | Status: AC

## 2021-03-20 MED ORDER — BISACODYL 10 MG RE SUPP
10.0000 mg | Freq: Every day | RECTAL | Status: DC | PRN
  Filled 2021-03-20: qty 1

## 2021-03-20 MED ORDER — METOCLOPRAMIDE HCL 10 MG PO TABS
ORAL_TABLET | ORAL | Status: AC
  Filled 2021-03-20: qty 1

## 2021-03-20 MED ORDER — LACTATED RINGERS IV BOLUS
250.0000 mL | INTRAVENOUS | Status: AC
  Administered 2021-03-20: 250 mL via INTRAVENOUS

## 2021-03-20 MED ORDER — CELECOXIB 200 MG PO CAPS
200.0000 mg | ORAL_CAPSULE | Freq: Two times a day (BID) | ORAL | Status: DC
  Administered 2021-03-21: 200 mg via ORAL

## 2021-03-20 MED ORDER — GABAPENTIN 300 MG PO CAPS: 300.0000 mg | ORAL_CAPSULE | Freq: Once | ORAL | Status: AC

## 2021-03-20 MED ORDER — BUPIVACAINE HCL (PF) 0.5 % IJ SOLN
INTRAMUSCULAR | Status: AC
  Filled 2021-03-20: qty 10

## 2021-03-20 MED ORDER — FENTANYL CITRATE (PF) 100 MCG/2ML IJ SOLN
INTRAMUSCULAR | Status: DC | PRN
  Administered 2021-03-20 (×3): 25 ug via INTRAVENOUS

## 2021-03-20 MED ORDER — OXYCODONE HCL 5 MG PO TABS
10.0000 mg | ORAL_TABLET | ORAL | Status: DC | PRN
  Administered 2021-03-20: 10 mg via ORAL
  Administered 2021-03-20: 5 mg via ORAL

## 2021-03-20 MED ORDER — FENTANYL CITRATE (PF) 100 MCG/2ML IJ SOLN
INTRAMUSCULAR | Status: AC
  Filled 2021-03-20: qty 2

## 2021-03-20 MED ORDER — CELECOXIB 200 MG PO CAPS
ORAL_CAPSULE | ORAL | Status: AC
  Administered 2021-03-20: 400 mg via ORAL
  Filled 2021-03-20: qty 2

## 2021-03-20 MED ORDER — PROPOFOL 1000 MG/100ML IV EMUL
INTRAVENOUS | Status: AC
  Filled 2021-03-20: qty 100

## 2021-03-20 MED ORDER — OXYCODONE HCL 5 MG PO TABS
ORAL_TABLET | ORAL | Status: AC
  Filled 2021-03-20: qty 1

## 2021-03-20 MED ORDER — HYDROMORPHONE HCL 1 MG/ML IJ SOLN: 0.5000 mg | INTRAMUSCULAR | Status: DC | PRN

## 2021-03-20 MED ORDER — TRANEXAMIC ACID-NACL 1000-0.7 MG/100ML-% IV SOLN: 1000.0000 mg | Freq: Once | INTRAVENOUS | Status: AC

## 2021-03-20 MED ORDER — CEFAZOLIN SODIUM-DEXTROSE 2-4 GM/100ML-% IV SOLN
2.0000 g | INTRAVENOUS | Status: AC
  Administered 2021-03-20: 2 g via INTRAVENOUS

## 2021-03-20 MED ORDER — SODIUM CHLORIDE 0.9 % IR SOLN
Status: DC | PRN
  Administered 2021-03-20: 3000 mL

## 2021-03-20 MED ORDER — FLEET ENEMA 7-19 GM/118ML RE ENEM: 1.0000 | ENEMA | Freq: Once | RECTAL | Status: DC | PRN

## 2021-03-20 MED ORDER — PROPOFOL 10 MG/ML IV BOLUS
INTRAVENOUS | Status: DC | PRN
  Administered 2021-03-20: 75 ug/kg/min via INTRAVENOUS
  Administered 2021-03-20 (×2): 30 mg via INTRAVENOUS

## 2021-03-20 MED ORDER — FENTANYL CITRATE (PF) 100 MCG/2ML IJ SOLN: 25.0000 ug | INTRAMUSCULAR | Status: DC | PRN

## 2021-03-20 MED ORDER — ACETAMINOPHEN 325 MG PO TABS: 325.0000 mg | ORAL_TABLET | Freq: Four times a day (QID) | ORAL | Status: DC | PRN

## 2021-03-20 MED ORDER — SOTALOL HCL 80 MG PO TABS
80.0000 mg | ORAL_TABLET | Freq: Two times a day (BID) | ORAL | Status: DC
  Administered 2021-03-20: 80 mg via ORAL
  Filled 2021-03-20 (×3): qty 1

## 2021-03-20 MED ORDER — TRANEXAMIC ACID-NACL 1000-0.7 MG/100ML-% IV SOLN
1000.0000 mg | INTRAVENOUS | Status: AC
  Administered 2021-03-20: 1000 mg via INTRAVENOUS

## 2021-03-20 MED ORDER — ACETAMINOPHEN 10 MG/ML IV SOLN
INTRAVENOUS | Status: AC
  Administered 2021-03-20: 1000 mg via INTRAVENOUS
  Filled 2021-03-20: qty 100

## 2021-03-20 MED ORDER — CELECOXIB 200 MG PO CAPS: 400.0000 mg | ORAL_CAPSULE | Freq: Once | ORAL | Status: AC

## 2021-03-20 MED ORDER — SODIUM CHLORIDE 0.9 % IV SOLN: INTRAVENOUS | Status: DC

## 2021-03-20 MED ORDER — ONDANSETRON HCL 4 MG/2ML IJ SOLN: 4.0000 mg | Freq: Once | INTRAMUSCULAR | Status: DC | PRN

## 2021-03-20 MED ORDER — PHENYLEPHRINE HCL-NACL 20-0.9 MG/250ML-% IV SOLN
INTRAVENOUS | Status: DC | PRN
  Administered 2021-03-20: 20 ug/min via INTRAVENOUS

## 2021-03-20 MED ORDER — PHENYLEPHRINE HCL (PRESSORS) 10 MG/ML IV SOLN
INTRAVENOUS | Status: DC | PRN
Start: 2021-03-20 — End: 2021-03-20
  Administered 2021-03-20: 160 ug via INTRAVENOUS
  Administered 2021-03-20: 80 ug via INTRAVENOUS
  Administered 2021-03-20 (×2): 160 ug via INTRAVENOUS
  Administered 2021-03-20: 80 ug via INTRAVENOUS
  Administered 2021-03-20: 160 ug via INTRAVENOUS

## 2021-03-20 MED ORDER — ENSURE PRE-SURGERY PO LIQD
296.0000 mL | Freq: Once | ORAL | Status: AC
  Administered 2021-03-20: 296 mL via ORAL
  Filled 2021-03-20: qty 296

## 2021-03-20 MED ORDER — MAGNESIUM HYDROXIDE 400 MG/5ML PO SUSP
30.0000 mL | Freq: Every day | ORAL | Status: DC
  Administered 2021-03-20: 30 mL via ORAL

## 2021-03-20 MED ORDER — SENNOSIDES-DOCUSATE SODIUM 8.6-50 MG PO TABS
1.0000 | ORAL_TABLET | Freq: Two times a day (BID) | ORAL | Status: DC
  Administered 2021-03-20 (×2): 1 via ORAL
  Filled 2021-03-20 (×4): qty 1

## 2021-03-20 MED ORDER — CEFAZOLIN SODIUM-DEXTROSE 2-4 GM/100ML-% IV SOLN
2.0000 g | Freq: Four times a day (QID) | INTRAVENOUS | Status: AC
  Administered 2021-03-20: 2 g via INTRAVENOUS

## 2021-03-20 MED ORDER — METOCLOPRAMIDE HCL 10 MG PO TABS
ORAL_TABLET | ORAL | Status: AC
  Administered 2021-03-20: 10 mg via ORAL
  Filled 2021-03-20: qty 1

## 2021-03-20 MED ORDER — TRANEXAMIC ACID-NACL 1000-0.7 MG/100ML-% IV SOLN
INTRAVENOUS | Status: AC
  Filled 2021-03-20: qty 100

## 2021-03-20 MED ORDER — LACTATED RINGERS IV SOLN: INTRAVENOUS | Status: DC

## 2021-03-20 MED ORDER — DEXAMETHASONE SODIUM PHOSPHATE 10 MG/ML IJ SOLN
INTRAMUSCULAR | Status: AC
  Administered 2021-03-20: 8 mg via INTRAVENOUS
  Filled 2021-03-20: qty 1

## 2021-03-20 MED ORDER — PANTOPRAZOLE SODIUM 40 MG PO TBEC
40.0000 mg | DELAYED_RELEASE_TABLET | Freq: Two times a day (BID) | ORAL | Status: DC
  Administered 2021-03-20 (×2): 40 mg via ORAL
  Filled 2021-03-20 (×4): qty 1

## 2021-03-20 MED ORDER — ONDANSETRON HCL 4 MG PO TABS: 4.0000 mg | ORAL_TABLET | Freq: Four times a day (QID) | ORAL | Status: DC | PRN

## 2021-03-20 MED ORDER — OXYCODONE HCL 5 MG PO TABS
5.0000 mg | ORAL_TABLET | ORAL | Status: DC | PRN
  Administered 2021-03-20 – 2021-03-21 (×2): 5 mg via ORAL

## 2021-03-20 MED ORDER — TRAMADOL HCL 50 MG PO TABS: 50.0000 mg | ORAL_TABLET | ORAL | Status: DC | PRN

## 2021-03-20 MED ORDER — METOCLOPRAMIDE HCL 10 MG PO TABS
10.0000 mg | ORAL_TABLET | Freq: Three times a day (TID) | ORAL | Status: DC
  Administered 2021-03-20 – 2021-03-21 (×2): 10 mg via ORAL

## 2021-03-20 MED ORDER — FAMOTIDINE 20 MG PO TABS
ORAL_TABLET | ORAL | Status: AC
  Filled 2021-03-20: qty 1

## 2021-03-20 MED ORDER — PHENYLEPHRINE HCL (PRESSORS) 10 MG/ML IV SOLN
INTRAVENOUS | Status: AC
  Filled 2021-03-20: qty 1

## 2021-03-20 MED ORDER — FERROUS SULFATE 325 (65 FE) MG PO TABS
325.0000 mg | ORAL_TABLET | Freq: Two times a day (BID) | ORAL | Status: DC
  Administered 2021-03-20 – 2021-03-21 (×2): 325 mg via ORAL
  Filled 2021-03-20 (×5): qty 1

## 2021-03-20 MED ORDER — MENTHOL 3 MG MT LOZG
1.0000 | LOZENGE | OROMUCOSAL | Status: DC | PRN
  Filled 2021-03-20: qty 9

## 2021-03-20 MED ORDER — CHLORHEXIDINE GLUCONATE 0.12 % MT SOLN
OROMUCOSAL | Status: AC
  Administered 2021-03-20: 15 mL via OROMUCOSAL
  Filled 2021-03-20: qty 15

## 2021-03-20 MED ORDER — DEXAMETHASONE SODIUM PHOSPHATE 10 MG/ML IJ SOLN: 8.0000 mg | Freq: Once | INTRAMUSCULAR | Status: AC

## 2021-03-20 MED ORDER — PRONTOSAN WOUND IRRIGATION OPTIME
TOPICAL | Status: DC | PRN
Start: 2021-03-20 — End: 2021-03-20
  Administered 2021-03-20: 1 via TOPICAL

## 2021-03-20 MED ORDER — TRANEXAMIC ACID-NACL 1000-0.7 MG/100ML-% IV SOLN
INTRAVENOUS | Status: AC
  Administered 2021-03-20: 1000 mg via INTRAVENOUS
  Filled 2021-03-20: qty 100

## 2021-03-20 MED ORDER — ONDANSETRON HCL 4 MG/2ML IJ SOLN: 4.0000 mg | Freq: Four times a day (QID) | INTRAMUSCULAR | Status: DC | PRN

## 2021-03-20 MED ORDER — PROPOFOL 500 MG/50ML IV EMUL: INTRAVENOUS | Status: DC | PRN

## 2021-03-20 MED ORDER — LIDOCAINE HCL (PF) 2 % IJ SOLN
INTRAMUSCULAR | Status: AC
  Filled 2021-03-20: qty 5

## 2021-03-20 MED ORDER — LIDOCAINE HCL (CARDIAC) PF 100 MG/5ML IV SOSY
PREFILLED_SYRINGE | INTRAVENOUS | Status: DC | PRN
  Administered 2021-03-20: 40 mg via INTRAVENOUS

## 2021-03-20 MED ORDER — CEFAZOLIN SODIUM-DEXTROSE 2-4 GM/100ML-% IV SOLN
INTRAVENOUS | Status: AC
  Filled 2021-03-20: qty 100

## 2021-03-20 MED ORDER — PHENOL 1.4 % MT LIQD
1.0000 | OROMUCOSAL | Status: DC | PRN
  Filled 2021-03-20: qty 177

## 2021-03-20 MED ORDER — DIPHENHYDRAMINE HCL 12.5 MG/5ML PO ELIX
12.5000 mg | ORAL_SOLUTION | ORAL | Status: DC | PRN
  Filled 2021-03-20: qty 10

## 2021-03-20 MED ORDER — ENOXAPARIN SODIUM 30 MG/0.3ML IJ SOSY
30.0000 mg | PREFILLED_SYRINGE | Freq: Two times a day (BID) | INTRAMUSCULAR | Status: DC
  Administered 2021-03-21: 30 mg via SUBCUTANEOUS
  Filled 2021-03-20 (×3): qty 0.3

## 2021-03-20 MED ORDER — ACETAMINOPHEN 10 MG/ML IV SOLN
INTRAVENOUS | Status: AC
  Filled 2021-03-20: qty 100

## 2021-03-20 MED ORDER — ALUM & MAG HYDROXIDE-SIMETH 200-200-20 MG/5ML PO SUSP: 30.0000 mL | ORAL | Status: DC | PRN

## 2021-03-20 SURGICAL SUPPLY — 68 items
ARTICULEZE HEAD (Hips) ×3 IMPLANT
BLADE DRUM FLTD (BLADE) ×3 IMPLANT
BLADE SAW 90X25X1.19 OSCILLAT (BLADE) ×3 IMPLANT
CARTRIDGE OIL MAESTRO DRILL (MISCELLANEOUS) ×2 IMPLANT
CATH COUDE FOLEY 2W 5CC 16FR (CATHETERS) IMPLANT
CATH FOLEY 2W COUNCIL 5CC 16FR (CATHETERS) ×1 IMPLANT
CATH FOLEY SIL 2WAY 12FR5CC (CATHETERS) IMPLANT
CATH PEDI SILICON 3C 10FR (CATHETERS) IMPLANT
CATH SET URETHRAL DILATOR (CATHETERS) ×1 IMPLANT
CUP ACET PNNCL SECTR W/GRIP 56 (Hips) IMPLANT
DIFFUSER DRILL AIR PNEUMATIC (MISCELLANEOUS) ×3 IMPLANT
DRAPE 3/4 80X56 (DRAPES) ×3 IMPLANT
DRAPE INCISE IOBAN 66X60 STRL (DRAPES) ×3 IMPLANT
DRSG DERMACEA 8X12 NADH (GAUZE/BANDAGES/DRESSINGS) ×3 IMPLANT
DRSG MEPILEX SACRM 8.7X9.8 (GAUZE/BANDAGES/DRESSINGS) ×3 IMPLANT
DRSG OPSITE POSTOP 4X12 (GAUZE/BANDAGES/DRESSINGS) ×3 IMPLANT
DRSG OPSITE POSTOP 4X14 (GAUZE/BANDAGES/DRESSINGS) IMPLANT
DRSG TEGADERM 4X4.75 (GAUZE/BANDAGES/DRESSINGS) ×3 IMPLANT
DURAPREP 26ML APPLICATOR (WOUND CARE) ×3 IMPLANT
ELECT CAUTERY BLADE 6.4 (BLADE) ×3 IMPLANT
ELECT REM PT RETURN 9FT ADLT (ELECTROSURGICAL) ×3
ELECTRODE REM PT RTRN 9FT ADLT (ELECTROSURGICAL) ×2 IMPLANT
GAUZE 4X4 16PLY ~~LOC~~+RFID DBL (SPONGE) ×3 IMPLANT
GLOVE SURG ENC MOIS LTX SZ7.5 (GLOVE) ×6 IMPLANT
GLOVE SURG ENC TEXT LTX SZ7.5 (GLOVE) ×6 IMPLANT
GLOVE SURG UNDER LTX SZ8 (GLOVE) ×3 IMPLANT
GLOVE SURG UNDER POLY LF SZ7.5 (GLOVE) ×3 IMPLANT
GOWN STRL REUS W/ TWL LRG LVL3 (GOWN DISPOSABLE) ×4 IMPLANT
GOWN STRL REUS W/ TWL XL LVL3 (GOWN DISPOSABLE) ×2 IMPLANT
GOWN STRL REUS W/TWL LRG LVL3 (GOWN DISPOSABLE) ×2
GOWN STRL REUS W/TWL XL LVL3 (GOWN DISPOSABLE) ×1
GUIDEWIRE STR DUAL SENSOR (WIRE) ×1 IMPLANT
HEAD ARTICULEZE (Hips) IMPLANT
HEMOVAC 400CC 10FR (MISCELLANEOUS) ×3 IMPLANT
HOLDER FOLEY CATH W/STRAP (MISCELLANEOUS) ×3 IMPLANT
HOLSTER ELECTROSUGICAL PENCIL (MISCELLANEOUS) ×6 IMPLANT
IV NS IRRIG 3000ML ARTHROMATIC (IV SOLUTION) ×3 IMPLANT
KIT PEG BOARD PINK (KITS) ×3 IMPLANT
KIT TURNOVER KIT A (KITS) ×3 IMPLANT
LINER MARATHON 4MM 10D 36X56 (Hips) ×1 IMPLANT
MANIFOLD NEPTUNE II (INSTRUMENTS) ×6 IMPLANT
NDL SAFETY ECLIPSE 18X1.5 (NEEDLE) ×2 IMPLANT
NEEDLE HYPO 18GX1.5 SHARP (NEEDLE) ×1
NS IRRIG 500ML POUR BTL (IV SOLUTION) ×3 IMPLANT
OIL CARTRIDGE MAESTRO DRILL (MISCELLANEOUS) ×3
PACK HIP PROSTHESIS (MISCELLANEOUS) ×3 IMPLANT
PENCIL SMOKE EVACUATOR COATED (MISCELLANEOUS) ×3 IMPLANT
PINN SECTOR W/GRIP ACE CUP 56 (Hips) ×3 IMPLANT
PULSAVAC PLUS IRRIG FAN TIP (DISPOSABLE) ×3
SOL PREP PVP 2OZ (MISCELLANEOUS) ×3
SOLUTION PREP PVP 2OZ (MISCELLANEOUS) ×2 IMPLANT
SOLUTION PRONTOSAN WOUND 350ML (IRRIGATION / IRRIGATOR) IMPLANT
SPONGE DRAIN TRACH 4X4 STRL 2S (GAUZE/BANDAGES/DRESSINGS) ×3 IMPLANT
SPONGE T-LAP 18X18 ~~LOC~~+RFID (SPONGE) ×14 IMPLANT
STAPLER SKIN PROX 35W (STAPLE) ×3 IMPLANT
STEM FEM CMNTLSS LG AML 15.0 (Hips) ×1 IMPLANT
SUT ETHIBOND #5 BRAIDED 30INL (SUTURE) ×3 IMPLANT
SUT VIC AB 0 CT1 36 (SUTURE) ×3 IMPLANT
SUT VIC AB 1 CT1 36 (SUTURE) ×6 IMPLANT
SUT VIC AB 2-0 CT1 27 (SUTURE) ×1
SUT VIC AB 2-0 CT1 TAPERPNT 27 (SUTURE) ×2 IMPLANT
SYR 20ML LL LF (SYRINGE) ×3 IMPLANT
TAPE CLOTH 3X10 WHT NS LF (GAUZE/BANDAGES/DRESSINGS) ×3 IMPLANT
TAPE TRANSPORE STRL 2 31045 (GAUZE/BANDAGES/DRESSINGS) ×3 IMPLANT
TIP FAN IRRIG PULSAVAC PLUS (DISPOSABLE) ×2 IMPLANT
TOWEL OR 17X26 4PK STRL BLUE (TOWEL DISPOSABLE) ×3 IMPLANT
TRAY FOLEY MTR SLVR 16FR STAT (SET/KITS/TRAYS/PACK) ×3 IMPLANT
WATER STERILE IRR 1000ML POUR (IV SOLUTION) ×1 IMPLANT

## 2021-03-20 NOTE — Progress Notes (Signed)
Met with the patient to discuss DC plan and needs I explained the MOON and that his stay is Observation He stated understanding He has a rolling walker at home, he will need a 3 in 1, [employer] will deliver to the bedside prior to DC His wife provides transportation He can afford his medication He is set up with [place] for [facility] services

## 2021-03-20 NOTE — Op Note (Signed)
OPERATIVE NOTE  DATE OF SURGERY:  03/20/2021  PATIENT NAME:  Leroy Merritt   DOB: [birth year]  MRN: [id number]  PRE-OPERATIVE DIAGNOSIS: Degenerative arthrosis of the right hip, primary  POST-OPERATIVE DIAGNOSIS:  Same  PROCEDURE:  Right total hip arthroplasty  SURGEON:  Marciano Sequin. M.D.  ASSISTANT: Cassell Smiles, PA-C (present and scrubbed throughout the case, critical for assistance with exposure, retraction, instrumentation, and closure)  ANESTHESIA: spinal  ESTIMATED BLOOD LOSS: 200 mL  FLUIDS REPLACED: 1000 mL of crystalloid  DRAINS: 2 medium Hemovac drains  IMPLANTS UTILIZED: DePuy 15 mm large stature AML femoral stem, 56 mm OD Pinnacle Sector acetabular component, +4 mm 10 degree Pinnacle Marathon polyethylene insert, and a 36 mm M-SPEC +5 mm hip ball  INDICATIONS FOR SURGERY: Leroy Merritt is a 76 y.o. year old male with a long history of progressive hip and groin  pain. X-rays demonstrated severe degenerative changes. The patient had not seen any significant improvement despite conservative nonsurgical intervention. After discussion of the risks and benefits of surgical intervention, the patient expressed understanding of the risks benefits and agree with plans for total hip arthroplasty.   The risks, benefits, and alternatives were discussed at length including but not limited to the risks of infection, bleeding, nerve injury, stiffness, blood clots, the need for revision surgery, limb length inequality, dislocation, cardiopulmonary complications, among others, and they were willing to proceed.  PROCEDURE IN DETAIL: The patient was brought into the operating room and, after adequate spinal anesthesia was achieved, the patient was placed in a left lateral decubitus position. Axillary roll was placed and all bony prominences were well-padded. The patient's right hip was cleaned and prepped with alcohol and DuraPrep and draped in the usual sterile fashion. A "timeout" was  performed as per usual protocol. A lateral curvilinear incision was made gently curving towards the posterior superior iliac spine. The IT band was incised in line with the skin incision and the fibers of the gluteus maximus were split in line. The piriformis tendon was identified, skeletonized, and incised at its insertion to the proximal femur and reflected posteriorly. A T type posterior capsulotomy was performed. Prior to dislocation of the femoral head, a threaded Steinmann pin was inserted through a separate stab incision into the pelvis superior to the acetabulum and bent in the form of a stylus so as to assess limb length and hip offset throughout the procedure. The femoral head was then dislocated posteriorly. Inspection of the femoral head demonstrated severe degenerative changes with full-thickness loss of articular cartilage. The femoral neck cut was performed using an oscillating saw. The anterior capsule was elevated off of the femoral neck using a periosteal elevator. Attention was then directed to the acetabulum. The remnant of the labrum was excised using electrocautery. Inspection of the acetabulum also demonstrated significant degenerative changes. The acetabulum was reamed in sequential fashion up to a 55 mm diameter. Good punctate bleeding bone was encountered. A 56 mm Pinnacle Sector acetabular component was positioned and impacted into place. Good scratch fit was appreciated. A +4 mm neutral polyethylene trial was inserted.  Attention was then directed to the proximal femur. A hole for reaming of the proximal femoral canal was created using a high-speed burr. The femoral canal was reamed in sequential fashion up to a 14.5 mm diameter. This allowed for approximately 7 cm of scratch fit.  It was thus elected to ream up to a 15 mm diameter to allow for a line to line fit.  Serial broaches were inserted up to a 15 mm large stature femoral broach. Calcar region was planed and a trial reduction  was performed using a 36 mm hip ball with a +5 mm neck length.  Reasonably good stability was noted but it was elected to trial a +4 mm 10 degree liner with the high side positioned at the 8 o'clock position.  Good equalization of limb lengths and hip offset was appreciated and excellent stability was noted both anteriorly and posteriorly. Trial components were removed. The acetabular shell was irrigated with copious amounts of normal saline with antibiotic solution and suctioned dry. A +4 mm 10 degree Pinnacle Marathon polyethylene insert was positioned with the high side at the 8 o'clock position and impacted into place. Next, a 15 mm large stature AML femoral stem was positioned and impacted into place. Excellent scratch fit was appreciated. A trial reduction was again performed with a 32 mm hip ball with a +5 mm neck length. Again, good equalization of limb lengths was appreciated and excellent stability appreciated both anteriorly and posteriorly. The hip was then dislocated and the trial hip ball was removed. The Morse taper was cleaned and dried. A 36 mm M-SPEC hip ball with a +5 mm neck length was placed on the trunnion and impacted into place. The hip was then reduced and placed through range of motion. Excellent stability was appreciated both anteriorly and posteriorly.  The wound was irrigated with copious amounts of normal saline followed by 350 ml of Prontosan and suctioned dry. Good hemostasis was appreciated. The posterior capsulotomy was repaired using #5 Ethibond. Piriformis tendon was reapproximated to the undersurface of the gluteus medius tendon using #5 Ethibond. The IT band was reapproximated using interrupted sutures of #1 Vicryl. Subcutaneous tissue was approximated using first #0 Vicryl followed by #2-0 Vicryl. The skin was closed with skin staples.  The patient tolerated the procedure well and was transported to the recovery room in stable condition.   Marciano Sequin., M.D.

## 2021-03-20 NOTE — Care Management Obs Status (Signed)
[place] NOTIFICATION   Patient Details  Name: Leroy Merritt MRN: [id number] Date of Birth: 05/07/1945   Medicare Observation Status Notification Given:  Yes    Conception Oms, RN 03/20/2021, 4:07 PM

## 2021-03-20 NOTE — H&P (Signed)
The patient has been re-examined, and the chart reviewed, and there have been no interval changes to the documented history and physical.    The risks, benefits, and alternatives have been discussed at length. The patient expressed understanding of the risks benefits and agreed with plans for surgical intervention.  Delorse Shane P. Kavonte Bearse, Jr. M.D.    

## 2021-03-20 NOTE — Op Note (Signed)
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 Hollice Espy  Complications: None  Blood loss: None  Indication: 76 year old male undergoing total hip arthroplasty with Dr. Marry Guan found to have urethral stricture.  Intraoperative urology consult called.  Procedure: Patient was reprepped and draped in the standard sterile fashion.  The urethral meatus was located slightly more ventrally than anticipated near the coronal margin and there was some adhesions around the urethral meatus.  There is also some whitish skin discoloration concerning for possible lichen sclerosis.    The meatus appeared slightly stenotic.  I used forceps to gently dilate the meatus and within the fossa navicularis which was also stricture.  I was able to advance a 80 [place] coud to the bulbar urethra where another area of tightness was noted and not able to advance beyond this area presumably secondary to bulbar urethral stricture.  I then advanced a sensor wire presumably to the bladder.  He has a 5 [place] open-ended ureteral catheter over this wire into the bladder and upon removing the wire, there was some scant urine confirming position of the bladder.  I then replaced the wire and remove the 5 [place] open-ended ureteral catheter.  I then used a dilator set starting a 64 [place] which is relatively tight up to 82 [place] to dilate the urethra serially.  I was then able to advance a 39 [place] coud over the wire into the bladder with some mild difficulty.  The balloon was filled and the catheter was pulled back to the bladder neck.  I then irrigated the catheter confirming position.  The wire was removed.  It was secured to the patient's left thigh.    We will plan to leave the catheter for at least 3 days postoperatively.  He will need to f/u with Urology.

## 2021-03-20 NOTE — Transfer of Care (Signed)
Immediate Anesthesia Transfer of Care Note  Patient: Leroy Merritt  Procedure(s) Performed: TOTAL HIP ARTHROPLASTY (Right: Hip)  Patient Location: PACU  Anesthesia Type:Spinal  Level of Consciousness: sedated  Airway & Oxygen Therapy: Patient Spontanous Breathing  Post-op Assessment: Report given to RN and Post -op Vital signs reviewed and stable  Post vital signs: Reviewed and stable  Last Vitals:  Vitals Value Taken Time  BP 89/61 03/20/21 1207  Temp    Pulse 38 03/20/21 1211  Resp 15 03/20/21 1211  SpO2 94 % 03/20/21 1211  Vitals shown include unvalidated device data.  Last Pain:  Vitals:   03/20/21 0630  TempSrc: Temporal  PainSc: 6          Complications: No notable events documented.

## 2021-03-20 NOTE — Progress Notes (Signed)
Pt and family informed that foley would be in place for 3 days. Dr. Cherrie Gauze office called spouse and set up appointment for next Tuesday 1/17 for foley removal. Dr. Erlene Quan confirmed that foley would need to be in place for 3 days, this falls on the weekend, so first available appointment is Tuesday 1/17. Pt/family notified and verbalizes understanding.

## 2021-03-20 NOTE — Evaluation (Signed)
Physical Therapy Evaluation Patient Details Name: Leroy Merritt MRN: [id number] DOB: 1945-08-02 Today's Date: 03/20/2021  History of Present Illness  Pt admitted for R THR. History of diverticulosis, BPH, and pacemaker. Pt is POD 0 at time of evaluation.  Clinical Impression  Pt is a pleasant 76 year old male who was admitted for R THR. Pt performs bed mobility, transfers, and ambulation with cga and RW. Educated on hip precautions and WBing status. Pt demonstrates deficits with strength/mobility/pain. Further mobility deferred as pt became nauseated and dizzy. Anticipate next PT session, p will be able to ambulate in hallway and perform stair training. Will bring written HEP to review. Would benefit from skilled PT to address above deficits and promote optimal return to PLOF. Recommend transition to [place] upon discharge from acute hospitalization.      Recommendations for follow up therapy are one component of a multi-disciplinary discharge planning process, led by the attending physician.  Recommendations may be updated based on patient status, additional functional criteria and insurance authorization.  Follow Up Recommendations Home health PT    Assistance Recommended at Discharge Set up Supervision/Assistance  Patient can return home with the following  A little help with walking and/or transfers;Help with stairs or ramp for entrance    Equipment Recommendations BSC/3in1  Recommendations for Other Services       Functional Status Assessment Patient has had a recent decline in their functional status and demonstrates the ability to make significant improvements in function in a reasonable and predictable amount of time.     Precautions / Restrictions Precautions Precautions: Fall;Posterior Hip Precaution Booklet Issued: No Restrictions Weight Bearing Restrictions: Yes RLE Weight Bearing: Weight bearing as tolerated      Mobility  Bed Mobility Overal bed mobility: Needs  Assistance Bed Mobility: Supine to Sit     Supine to sit: Min guard     General bed mobility comments: needs assist for sliding B LEs off bed. Once seated at EOB, upright posture noted    Transfers Overall transfer level: Needs assistance Equipment used: Rolling walker (2 wheels) Transfers: Sit to/from Stand Sit to Stand: Min guard           General transfer comment: safe technique with upright posture. Used RW.    Ambulation/Gait Ambulation/Gait assistance: Min guard Gait Distance (Feet): 3 Feet Assistive device: Rolling walker (2 wheels) Gait Pattern/deviations: Step-to pattern       General Gait Details: ambulated over to recliner. Further ambulation deferred due to dizziness and nausea.  Stairs            Wheelchair Mobility    Modified Rankin (Stroke Patients Only)       Balance Overall balance assessment: Needs assistance Sitting-balance support: Feet supported Sitting balance-Leahy Scale: Good     Standing balance support: Bilateral upper extremity supported Standing balance-Leahy Scale: Good                               Pertinent Vitals/Pain Pain Assessment: 0-10 Pain Score: 4  Pain Location: R LE Pain Descriptors / Indicators: Discomfort;Dull;Operative site guarding Pain Intervention(s): Limited activity within patient's tolerance;Premedicated before session;Ice applied;Repositioned    Home Living Family/patient expects to be discharged to:: Private residence Living Arrangements: Spouse/significant other Available Help at Discharge: Family;Available 24 hours/day Type of Home: House Home Access: Stairs to enter Entrance Stairs-Rails: Can reach both Entrance Stairs-Number of Steps: 5   Home Layout: Able to live on main  level with bedroom/bathroom Home Equipment: Rolling Walker (2 wheels);Cane - single point      Prior Function Prior Level of Function : Independent/Modified Independent;Driving             Mobility  Comments: was using SPC and RW on PRN basis       Hand Dominance        Extremity/Trunk Assessment   Upper Extremity Assessment Upper Extremity Assessment: Overall WFL for tasks assessed    Lower Extremity Assessment Lower Extremity Assessment: Generalized weakness (L LE grossly 3/5)       Communication   Communication: No difficulties  Cognition Arousal/Alertness: Awake/alert Behavior During Therapy: WFL for tasks assessed/performed Overall Cognitive Status: Within Functional Limits for tasks assessed                                          General Comments      Exercises Other Exercises Other Exercises: supine ther-ex performed on R LE including AP, quad sets, and hip abd/add x 10 reps with cga   Assessment/Plan    PT Assessment Patient needs continued PT services  PT Problem List Decreased strength;Decreased mobility;Pain;Decreased balance       PT Treatment Interventions Gait training;Stair training;DME instruction;Therapeutic exercise    PT Goals (Current goals can be found in the Care Plan section)  Acute Rehab PT Goals Patient Stated Goal: to wash his car PT Goal Formulation: With patient Time For Goal Achievement: 04/03/21 Potential to Achieve Goals: Good    Frequency BID     Co-evaluation               AM-PAC PT "6 Clicks" Mobility  Outcome Measure Help needed turning from your back to your side while in a flat bed without using bedrails?: A Little Help needed moving from lying on your back to sitting on the side of a flat bed without using bedrails?: A Little Help needed moving to and from a bed to a chair (including a wheelchair)?: A Little Help needed standing up from a chair using your arms (e.g., wheelchair or bedside chair)?: A Little Help needed to walk in hospital room?: A Little Help needed climbing 3-5 steps with a railing? : A Little 6 Click Score: 18    End of Session Equipment Utilized During Treatment:  Gait belt Activity Tolerance: Patient tolerated treatment well Patient left: in chair;with SCD's reapplied Nurse Communication: Mobility status PT Visit Diagnosis: Muscle weakness (generalized) (M62.81);Pain;Difficulty in walking, not elsewhere classified (R26.2) Pain - Right/Left: Right Pain - part of body: Hip    Time: 3491-7915 PT Time Calculation (min) (ACUTE ONLY): 33 min   Charges:   PT Evaluation $PT Eval Low Complexity: 1 Low PT Treatments $Therapeutic Exercise: 8-22 mins       Greggory Stallion, PT, DPT, GCS [phone]   Delila Kuklinski 03/20/2021, 4:55 PM

## 2021-03-20 NOTE — Anesthesia Procedure Notes (Signed)
Spinal  Patient location during procedure: OR Start time: 03/20/2021 7:20 AM End time: 03/20/2021 7:27 AM Reason for block: surgical anesthesia Staffing Performed: resident/CRNA  Anesthesiologist: Molli Barrows, MD Resident/CRNA: Rona Ravens, CRNA Preanesthetic Checklist Completed: patient identified, IV checked, site marked, risks and benefits discussed, surgical consent, monitors and equipment checked, pre-op evaluation and timeout performed Spinal Block Patient position: sitting Prep: Betadine Patient monitoring: heart rate, continuous pulse ox, blood pressure and cardiac monitor Approach: midline Location: L4-5 Injection technique: single-shot Needle Needle type: Introducer and Pencan  Needle gauge: 24 G Needle length: 9 cm Assessment Events: CSF return Additional Notes Negative paresthesia. Negative blood return. Positive free-flowing CSF. Expiration date of kit checked and confirmed. Patient tolerated procedure well, without complications.

## 2021-03-20 NOTE — TOC Progression Note (Signed)
Transition of Care [facility]) - Progression Note    Patient Details  Name: Leroy Merritt MRN: [id number] Date of Birth: Apr 12, 1945  Transition of Care [facility]) CM/SW [place], RN Phone Number: 03/20/2021, 12:51 PM  Clinical Narrative:   The patient is open with [place] for [facility] services         Expected Discharge Plan and Services                                                 Social Determinants of Health (SDOH) Interventions    Readmission Risk Interventions No flowsheet data found.

## 2021-03-20 NOTE — Anesthesia Preprocedure Evaluation (Signed)
Anesthesia Evaluation  Patient identified by MRN, date of birth, ID band Patient awake    Reviewed: Allergy & Precautions, H&P , NPO status , Patient's Chart, lab work & pertinent test results, reviewed documented beta blocker date and time   Airway Mallampati: III   Neck ROM: full    Dental  (+) Poor Dentition   Pulmonary neg pulmonary ROS,    Pulmonary exam normal        Cardiovascular Exercise Tolerance: Poor (-) angina+ CAD  (-) Orthopnea and (-) PND + dysrhythmias Ventricular Tachycardia + Cardiac Defibrillator  Rhythm:regular Rate:Normal     Neuro/Psych negative neurological ROS  negative psych ROS   GI/Hepatic Neg liver ROS, GERD  Medicated,  Endo/Other  negative endocrine ROS  Renal/GU negative Renal ROS  negative genitourinary   Musculoskeletal   Abdominal   Peds  Hematology negative hematology ROS (+)   Anesthesia Other Findings Past Medical History: No date: AICD (automatic cardioverter/defibrillator) present 03/22/2019: Aortic root dilatation (HCC)     Comment:  a.) TTE 03/22/2019: measured 3.9 cm No date: Arthritis 03/13/2020: Ascending aorta dilation ([place])     Comment:  a.) TTE 03/13/2020: measured 3.7 cm 04/28/2017: AV dissociation No date: BPH (benign prostatic hyperplasia) No date: CAD (coronary artery disease)     Comment:  a.) LHC 04/28/2017: severe LV dysfunction with EF               25-30%; global HK, LVEDP 14 mmHg; no obstructive CAD. No date: Cataract 2017: Diverticulosis 12/20/2014: Dysphagia No date: GERD (gastroesophageal reflux disease) 04/29/2017: HFrEF (heart failure with reduced ejection fraction) ([place])     Comment:  a.) TTE 04/29/2017: mod-sev LV ysfunction; EF 30-35%;               global HK; G1DD. b.) Cardiac MRI 04/29/2017:               biventricular/biatrial enlargement; LVEF 27%; global HK. No date: Hypercholesterolemia No date: NICM (nonischemic cardiomyopathy)  ([place]) 12/20/2014: Varicose veins of both lower extremities 04/28/2017: Ventricular tachycardia     Comment:  a.  Status post SJM dual-chamber ICD in 04/2017 Past Surgical History: 04/28/2017: CARDIAC CATHETERIZATION 2017: CATARACT EXTRACTION W/ INTRAOCULAR LENS IMPLANT; Right 05/20/2019: CATARACT EXTRACTION W/PHACO; Left     Comment:  Procedure: CATARACT EXTRACTION PHACO AND INTRAOCULAR               LENS PLACEMENT ([place]) LEFT;  Surgeon: Birder Robson,               MD;  Location: [facility] ORS;  Service: Ophthalmology;                Laterality: Left;  [place] 01:19.7 CDE 14.58 Fluid Pack Lot               # [id number] 02/19/2015: ESOPHAGOGASTRODUODENOSCOPY (EGD) WITH PROPOFOL; N/A     Comment:  Procedure: ESOPHAGOGASTRODUODENOSCOPY (EGD) WITH               PROPOFOL and esophageal dilation.;  Surgeon: Lucilla Lame,              MD;  Location: [place];  Service: Endoscopy;               Laterality: N/A; No date: EYE SURGERY 04/29/2017: ICD IMPLANT; N/A     Comment:  Procedure: ICD IMPLANT;  Surgeon: Evans Lance, MD;                Location: [facility] INVASIVE CV  LAB;  Service: Cardiovascular;                Laterality: N/A; 04/28/2017: LEFT HEART CATH AND CORONARY ANGIOGRAPHY; N/A     Comment:  Procedure: LEFT HEART CATH AND CORONARY ANGIOGRAPHY;                Surgeon: Martin[place], Peter M, MD;  Location: [place] CV               LAB;  Service: Cardiovascular;  Laterality: N/A; ~ 2012: PROSTATE BIOPSY 1990s: RETINAL DETACHMENT SURGERY; Right     Comment:  "torn retina" BMI    Body Mass Index: 26.11 kg/m     Reproductive/Obstetrics negative OB ROS                             Anesthesia Physical Anesthesia Plan  ASA: 4  Anesthesia Plan: Spinal   Post-op Pain Management:    Induction:   PONV Risk Score and Plan: 2  Airway Management Planned:   Additional Equipment:   Intra-op Plan:   Post-operative Plan:   Informed Consent: I have reviewed  the patients History and Physical, chart, labs and discussed the procedure including the risks, benefits and alternatives for the proposed anesthesia with the patient or authorized representative who has indicated his/her understanding and acceptance.     Dental Advisory Given  Plan Discussed with: CRNA  Anesthesia Plan Comments:         Anesthesia Quick Evaluation

## 2021-03-21 ENCOUNTER — Encounter: Payer: Self-pay | Admitting: Orthopedic Surgery

## 2021-03-21 DIAGNOSIS — M1611 Unilateral primary osteoarthritis, right hip: Secondary | ICD-10-CM | POA: Diagnosis not present

## 2021-03-21 DIAGNOSIS — I420 Dilated cardiomyopathy: Secondary | ICD-10-CM | POA: Diagnosis not present

## 2021-03-21 DIAGNOSIS — N400 Enlarged prostate without lower urinary tract symptoms: Secondary | ICD-10-CM | POA: Diagnosis not present

## 2021-03-21 DIAGNOSIS — Z79899 Other long term (current) drug therapy: Secondary | ICD-10-CM | POA: Diagnosis not present

## 2021-03-21 MED ORDER — METOCLOPRAMIDE HCL 10 MG PO TABS
ORAL_TABLET | ORAL | Status: AC
  Administered 2021-03-21: 10 mg via ORAL
  Filled 2021-03-21: qty 1

## 2021-03-21 MED ORDER — OXYCODONE HCL 5 MG PO TABS
ORAL_TABLET | ORAL | Status: AC
  Filled 2021-03-21: qty 1

## 2021-03-21 MED ORDER — TRAMADOL HCL 50 MG PO TABS: 50.0000 mg | ORAL_TABLET | ORAL | 0 refills | Status: DC | PRN

## 2021-03-21 MED ORDER — ENOXAPARIN SODIUM 40 MG/0.4ML IJ SOSY
40.0000 mg | PREFILLED_SYRINGE | INTRAMUSCULAR | 0 refills | Status: DC
Start: 2021-03-21 — End: 2021-05-08

## 2021-03-21 MED ORDER — CELECOXIB 200 MG PO CAPS
ORAL_CAPSULE | ORAL | Status: AC
  Filled 2021-03-21: qty 1

## 2021-03-21 MED ORDER — OXYCODONE HCL 5 MG PO TABS: 5.0000 mg | ORAL_TABLET | ORAL | 0 refills | Status: DC | PRN

## 2021-03-21 MED ORDER — CELECOXIB 200 MG PO CAPS: 200.0000 mg | ORAL_CAPSULE | Freq: Two times a day (BID) | ORAL | 0 refills | Status: DC

## 2021-03-21 MED ORDER — OXYCODONE HCL 5 MG PO TABS
ORAL_TABLET | ORAL | Status: AC
  Administered 2021-03-21: 5 mg via ORAL
  Filled 2021-03-21: qty 1

## 2021-03-21 MED ORDER — METOCLOPRAMIDE HCL 10 MG PO TABS
ORAL_TABLET | ORAL | Status: AC
  Filled 2021-03-21: qty 1

## 2021-03-21 MED ORDER — MAGNESIUM HYDROXIDE 400 MG/5ML PO SUSP
ORAL | Status: AC
  Filled 2021-03-21: qty 30

## 2021-03-21 NOTE — Progress Notes (Signed)
Physical Therapy Treatment Patient Details Name: Leroy Merritt MRN: [id number] DOB: 09/03/1945 Today's Date: 03/21/2021   History of Present Illness Pt admitted for R THR. History of diverticulosis, BPH, and pacemaker.    PT Comments    Patient has made excellent progress towards PT goals and is cleared from a mobility perspective to be discharged to home. Gait training progressed to hallway ambulation with rolling walker. Goal for stair training was completed without difficulty. I personally reviewed all exercises in the HEP and patient verbalized understanding. He was able to recall all hip precautions without promoting and demonstrated understanding with functional mobility. He asked questions appropriately as well. Spouse present throughout session. Recommend HHPT at discharge at this time.     Recommendations for follow up therapy are one component of a multi-disciplinary discharge planning process, led by the attending physician.  Recommendations may be updated based on patient status, additional functional criteria and insurance authorization.  Follow Up Recommendations  Home health PT     Assistance Recommended at Discharge Set up Supervision/Assistance  Patient can return home with the following     Equipment Recommendations  Other (comment) (RW at home and 3-in 1 has been ordered according to notes)    Recommendations for Other Services       Precautions / Restrictions Precautions Precautions: Fall;Posterior Hip Precaution Booklet Issued: Yes (comment) Restrictions Weight Bearing Restrictions: Yes RLE Weight Bearing: Weight bearing as tolerated     Mobility  Bed Mobility               General bed mobility comments: not observed as patient sitting up on arrival and post session    Transfers Overall transfer level: Needs assistance Equipment used: Rolling walker (2 wheels) Transfers: Sit to/from Stand Sit to Stand: Supervision           General  transfer comment: multiple standing bouts performed from recliner chair. with initial instruction for hand placement, patient demonstrated carry over    Ambulation/Gait Ambulation/Gait assistance: Supervision Gait Distance (Feet): 170 Feet Assistive device: Rolling walker (2 wheels) Gait Pattern/deviations: Step-through pattern;Decreased stance time - right Gait velocity: decreased     General Gait Details: verbal cues for proper placement of rolling walker. step through pattern demonstrated. encouraged patient to ambualte multiple short bouts at home using rolling walker and to use rolling walker until cleared by home health PT/oupatient PT.   Stairs Stairs: Yes Stairs assistance: Supervision Stair Management: Two rails;Step to pattern;Forwards Number of Stairs: 4 General stair comments: after initial verbal and demonstration, patient demonstrated correct technique to go up and down 4 steps with 2 rails without difficulty. spouse was also present and education provided on how she can assist with rolling walker placement   Wheelchair Mobility    Modified Rankin (Stroke Patients Only)       Balance Overall balance assessment: Needs assistance Sitting-balance support: Feet supported Sitting balance-Leahy Scale: Good     Standing balance support: Bilateral upper extremity supported Standing balance-Leahy Scale: Good                              Cognition Arousal/Alertness: Awake/alert Behavior During Therapy: WFL for tasks assessed/performed Overall Cognitive Status: Within Functional Limits for tasks assessed                                 General Comments: patient able to follow  all commands without difficulty        Exercises Other Exercises Other Exercises: I have personally previewed all exercises in the HEP packet. also reviewed car transfer technqiue with patient and spouse and importance of maintaining hip precautions with all  functional mobility. patient was able to recall 3/3 hip precuations without prompting and demonstrated understanding with functional mobility.    General Comments        Pertinent Vitals/Pain Pain Assessment: 0-10 Pain Score: 4  Pain Location: right hip incision Pain Descriptors / Indicators: Sore Pain Intervention(s): Limited activity within patient's tolerance (encoruaged ice pack PRN)    Home Living                          Prior Function            PT Goals (current goals can now be found in the care plan section) Acute Rehab PT Goals Patient Stated Goal: to return home PT Goal Formulation: With patient Time For Goal Achievement: 04/03/21 Potential to Achieve Goals: Good Progress towards PT goals: Progressing toward goals    Frequency    BID      PT Plan Current plan remains appropriate    Co-evaluation              AM-PAC PT "6 Clicks" Mobility   Outcome Measure  Help needed turning from your back to your side while in a flat bed without using bedrails?: A Little Help needed moving from lying on your back to sitting on the side of a flat bed without using bedrails?: A Little Help needed moving to and from a bed to a chair (including a wheelchair)?: A Little Help needed standing up from a chair using your arms (e.g., wheelchair or bedside chair)?: A Little Help needed to walk in hospital room?: A Little Help needed climbing 3-5 steps with a railing? : A Little 6 Click Score: 18    End of Session Equipment Utilized During Treatment: Gait belt Activity Tolerance: Patient tolerated treatment well Patient left: in chair;with call bell/phone within reach;with family/visitor present Nurse Communication: Mobility status (okay to be discharged from PT perspective) PT Visit Diagnosis: Muscle weakness (generalized) (M62.81);Pain;Difficulty in walking, not elsewhere classified (R26.2) Pain - Right/Left: Right Pain - part of body: Hip     Time:  6712-4580 PT Time Calculation (min) (ACUTE ONLY): 34 min  Charges:  $Gait Training: 8-22 mins $Therapeutic Activity: 8-22 mins                     Leroy Merritt, PT, MPT    Leroy Merritt 03/21/2021, 10:58 AM

## 2021-03-21 NOTE — Anesthesia Postprocedure Evaluation (Signed)
Anesthesia Post Note  Patient: Leroy Merritt  Procedure(s) Performed: TOTAL HIP ARTHROPLASTY (Right: Hip) URETHRAL DILATION UNDER ANESTHESIA  Patient location during evaluation: Nursing Unit Anesthesia Type: Spinal Level of consciousness: oriented and awake and alert Pain management: pain level controlled Vital Signs Assessment: post-procedure vital signs reviewed and stable Respiratory status: spontaneous breathing and respiratory function stable Cardiovascular status: blood pressure returned to baseline and stable Postop Assessment: no headache, no backache, no apparent nausea or vomiting and spinal receding Anesthetic complications: no   No notable events documented.   Last Vitals:  Vitals:   03/20/21 2352 03/21/21 0500  BP: 103/60 131/74  Pulse: 75 88  Resp: 18 18  Temp: 37 C 37.1 C  SpO2: 94% 97%    Last Pain:  Vitals:   03/21/21 0500  TempSrc: Temporal  PainSc:                  Brantley Fling

## 2021-03-21 NOTE — Progress Notes (Addendum)
°  Subjective: 1 Day Post-Op Procedure(s) (LRB): TOTAL HIP ARTHROPLASTY (Right) URETHRAL DILATION UNDER ANESTHESIA Patient reports pain as well-controlled.   Patient is well, and has had no acute complaints or problems Plan is to go Home after hospital stay. Negative for chest pain and shortness of breath Fever: no Gastrointestinal: negative for nausea and vomiting.  Patient has not had a bowel movement.  Objective: Vital signs in last 24 hours: Temp:  [96.8 F (36 C)-98.8 F (37.1 C)] 97.4 F (36.3 C) (01/12 0759) Pulse Rate:  [44-88] 79 (01/12 0759) Resp:  [14-20] 20 (01/12 0759) BP: (74-166)/(49-77) 118/77 (01/12 0759) SpO2:  [94 %-100 %] 95 % (01/12 0759)  Intake/Output from previous day:  Intake/Output Summary (Last 24 hours) at 03/21/2021 1012 Last data filed at 03/21/2021 0808 Gross per 24 hour  Intake 3016.67 ml  Output 3290 ml  Net -273.33 ml    Intake/Output this shift: Total I/O In: -  Out: 50 [Drains:50]  Labs: No results for input(s): HGB in the last 72 hours. No results for input(s): WBC, RBC, HCT, PLT in the last 72 hours. No results for input(s): NA, K, CL, CO2, BUN, CREATININE, GLUCOSE, CALCIUM in the last 72 hours. No results for input(s): LABPT, INR in the last 72 hours.   EXAM General - Patient is Alert, Appropriate, and Oriented Extremity - Neurovascular intact Dorsiflexion/Plantar flexion intact Compartment soft Dressing/Incision -clean, dry, no drainage, Hemovac in place.  Motor Function - intact, moving foot and toes well on exam. Able to perform independent SLR.  Cardiovascular- Regular rate and rhythm, no murmurs/rubs/gallops Respiratory- Lungs clear to auscultation bilaterally Gastrointestinal- soft, nontender, and active bowel sounds   Assessment/Plan: 1 Day Post-Op Procedure(s) (LRB): TOTAL HIP ARTHROPLASTY (Right) URETHRAL DILATION UNDER ANESTHESIA Principal Problem:   Hx of total hip arthroplasty, right  Estimated body mass  index is 26.11 kg/m as calculated from the following:   Height as of this encounter: 5\' 10"  (1.778 m).   Weight as of this encounter: 82.6 kg. Advance diet Up with therapy  Up with therapy, maintain posterior hip precautions   Hemovac removed. and Mini compression dressing applied.   DVT Prophylaxis - Lovenox, Ted hose, and SCDs Weight-Bearing as tolerated to right leg  Cassell Smiles, PA-C [facility] 03/21/2021, 10:12 AM

## 2021-03-21 NOTE — Progress Notes (Signed)
PT Cancellation Note  Patient Details Name: Leroy Merritt MRN: [id number] DOB: 07-24-1945   Cancelled Treatment:    Reason Eval/Treat Not Completed: Other (comment). Spoke with patient and family at the bedside. Patient declined need for afternoon PT session. He completed mobility requirements for discharge this morning including stair training, gait training, and review of exercises/hip precautions. Alerted Dr. Marry Guan via secure chat. Patient is anticipated to d/c home today.   [name], PT, MPT  Percell Locus 03/21/2021, 3:19 PM

## 2021-03-21 NOTE — Evaluation (Signed)
Occupational Therapy Evaluation Patient Details Name: Leroy Merritt MRN: [id number] DOB: 08-Jun-1945 Today's Date: 03/21/2021   History of Present Illness Pt admitted for R THR. History of diverticulosis, BPH, and pacemaker.   Clinical Impression   Patient presenting with decreased Ind in self care, balance, functional mobility/transfers, endurance, and safety awareness. Patient reports living at home with wife and being Mod I at baseline. Pt correctly states posterior hip precautions. OT educated pt and wife on self care tasks and need for AE to increase independence in LB self care. Paper handout provided as well. Patient will benefit from acute OT to increase overall independence in the areas of ADLs, functional mobility, and safety awareness in order to safely discharge home with family.      Recommendations for follow up therapy are one component of a multi-disciplinary discharge planning process, led by the attending physician.  Recommendations may be updated based on patient status, additional functional criteria and insurance authorization.   Follow Up Recommendations  No OT follow up    Assistance Recommended at Discharge Intermittent Supervision/Assistance  Patient can return home with the following A little help with walking and/or transfers;A little help with bathing/dressing/bathroom    Functional Status Assessment  Patient has had a recent decline in their functional status and demonstrates the ability to make significant improvements in function in a reasonable and predictable amount of time.  Equipment Recommendations  BSC/3in1       Precautions / Restrictions Precautions Precautions: Fall;Posterior Hip Precaution Booklet Issued: Yes (comment) Restrictions Weight Bearing Restrictions: Yes RLE Weight Bearing: Weight bearing as tolerated      Mobility Bed Mobility Overal bed mobility: Needs Assistance Bed Mobility: Supine to Sit     Supine to sit: Supervision      General bed mobility comments: cuing for technique with precautions    Transfers Overall transfer level: Needs assistance Equipment used: Rolling walker (2 wheels) Transfers: Sit to/from Stand Sit to Stand: Supervision;Min guard           General transfer comment: min guard progressing to supervision      Balance Overall balance assessment: Needs assistance Sitting-balance support: Feet supported Sitting balance-Leahy Scale: Good     Standing balance support: Bilateral upper extremity supported;During functional activity;Reliant on assistive device for balance Standing balance-Leahy Scale: Good                             ADL either performed or assessed with clinical judgement   ADL Overall ADL's : Needs assistance/impaired                     Lower Body Dressing: Minimal assistance;Sitting/lateral leans Lower Body Dressing Details (indicate cue type and reason): secondary to precautions without use of AD Toilet Transfer: Minimal assistance Toilet Transfer Details (indicate cue type and reason): simulated                 Vision Patient Visual Report: No change from baseline              Pertinent Vitals/Pain Pain Assessment: 0-10 Pain Score: 4  Pain Location: right hip incision Pain Descriptors / Indicators: Sore;Aching Pain Intervention(s): Limited activity within patient's tolerance;Monitored during session;Repositioned     Hand Dominance Right   Extremity/Trunk Assessment Upper Extremity Assessment Upper Extremity Assessment: Overall WFL for tasks assessed   Lower Extremity Assessment Lower Extremity Assessment: Generalized weakness       Communication Communication  Communication: No difficulties   Cognition Arousal/Alertness: Awake/alert Behavior During Therapy: WFL for tasks assessed/performed Overall Cognitive Status: Within Functional Limits for tasks assessed                                  General Comments: patient able to follow all commands without difficulty. Pleasant and cooperative.        Exercises Other Exercises Other Exercises: I have personally previewed all exercises in the HEP packet. also reviewed car transfer technqiue with patient and spouse and importance of maintaining hip precautions with all functional mobility. patient was able to recall 3/3 hip precuations without prompting and demonstrated understanding with functional mobility.        Home Living Family/patient expects to be discharged to:: Private residence Living Arrangements: Spouse/significant other Available Help at Discharge: Family;Available 24 hours/day Type of Home: House Home Access: Stairs to enter [employer] of Steps: 5 Entrance Stairs-Rails: Can reach both Home Layout: Able to live on main level with bedroom/bathroom     Bathroom Shower/Tub: Tub/shower unit         Home Equipment: Conservation officer, nature (2 wheels);Cane - single point          Prior Functioning/Environment Prior Level of Function : Independent/Modified Independent;Driving             Mobility Comments: was using SPC and RW on PRN basis ADLs Comments: mod I for self care tasks        OT Problem List: Decreased strength;Decreased range of motion;Decreased activity tolerance;Impaired balance (sitting and/or standing);Decreased safety awareness;Pain;Decreased cognition;Decreased knowledge of precautions;Decreased knowledge of use of DME or AE         OT Goals(Current goals can be found in the care plan section) Acute Rehab OT Goals Patient Stated Goal: to go home OT Goal Formulation: With patient Time For Goal Achievement: 04/04/21 Potential to Achieve Goals: Good ADL Goals Pt Will Perform Grooming: with modified independence;standing Pt Will Perform Lower Body Dressing: with modified independence;sit to/from stand Pt Will Transfer to Toilet: with modified independence;ambulating Pt Will  Perform Toileting - Clothing Manipulation and hygiene: with modified independence;sit to/from stand  OT Frequency:         AM-PAC OT "6 Clicks" Daily Activity     Outcome Measure Help from another person eating meals?: None Help from another person taking care of personal grooming?: None Help from another person toileting, which includes using toliet, bedpan, or urinal?: A Little Help from another person bathing (including washing, rinsing, drying)?: A Little Help from another person to put on and taking off regular upper body clothing?: A Little Help from another person to put on and taking off regular lower body clothing?: A Little 6 Click Score: 20   End of Session Equipment Utilized During Treatment: Rolling walker (2 wheels) Nurse Communication: Mobility status  Activity Tolerance: Patient tolerated treatment well Patient left: in chair;with call bell/phone within reach;with family/visitor present  OT Visit Diagnosis: Unsteadiness on feet (R26.81);Repeated falls (R29.6);Muscle weakness (generalized) (M62.81)                Time: 8756-4332 OT Time Calculation (min): 19 min Charges:  OT General Charges $OT Visit: 1 Visit OT Evaluation $OT Eval Moderate Complexity: 1 Mod OT Treatments $Self Care/Home Management : 8-22 mins  Darleen Crocker, MS, OTR/L , CBIS ascom [phone]  03/21/21, 2:02 PM

## 2021-03-21 NOTE — Discharge Summary (Signed)
Physician Discharge Summary  Patient ID: Leroy Merritt MRN: [id number] DOB/AGE: 78-Sep-1947 78 y.o.  Admit date: 03/20/2021 Discharge date: 03/21/2021  Admission Diagnoses:  Hx of total hip arthroplasty, right [Z96.641]  Surgeries:Procedure(s): PROCEDURE:  Right total hip arthroplasty   SURGEON:  Marciano Sequin. M.D.   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 Hollice Espy    Discharge Diagnoses: Patient Active Problem List   Diagnosis Date Noted   Hx of total hip arthroplasty, right 03/20/2021   Primary osteoarthritis of right hip 12/23/2020   First degree AV block 75/17/0017   Chronic systolic heart failure ([place]) 09/16/2018   ICD (implantable cardioverter-defibrillator) in place 09/16/2018   Ventricular tachycardia 04/28/2017   Near syncope 04/28/2017   Dilated cardiomyopathy ([place]) 04/28/2017   Advanced care planning/counseling discussion 03/17/2017   Palpitations 03/17/2017   Reflux esophagitis    Stricture and stenosis of esophagus    BPH (benign prostatic hyperplasia) 12/20/2014   Varicose veins of both lower extremities 12/20/2014    Past Medical History:  Diagnosis Date   AICD (automatic cardioverter/defibrillator) present    Aortic root dilatation ([place]) 03/22/2019   a.) TTE  03/22/2019: measured 3.9 cm   Arthritis    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   Cataract    Diverticulosis 2017   Dysphagia 12/20/2014   GERD (gastroesophageal reflux disease)    HFrEF (heart failure with reduced ejection fraction) ([place]) 04/29/2017   a.) TTE 04/29/2017: mod-sev LV ysfunction; EF 30-35%; global HK; G1DD. b.) Cardiac MRI 04/29/2017: biventricular/biatrial enlargement; LVEF 27%; global HK.   Hypercholesterolemia    NICM (nonischemic cardiomyopathy) ([place])    Varicose veins of both lower extremities 12/20/2014   Ventricular tachycardia 04/28/2017   a.  Status post SJM dual-chamber ICD in 04/2017     Transfusion:    Consultants (if any): Urology, Dr Hollice Espy  Discharged Condition: Improved  Hospital Course: Leroy Merritt is an 76 y.o. male who was admitted 03/20/2021 with a diagnosis of right hip osteoarthritis and went to the operating room on 03/20/2021 and underwent right total hip arthroplasty through posterior approach. Prior to the total hip replacement, there was significant difficulty placing the foley catheter and urology was consulted. Dr Erlene Quan performed a urethral dilation with Foley catheter placement. The patient received perioperative antibiotics for prophylaxis (see below). The patient tolerated the procedure well and was transported to PACU in stable condition. After meeting PACU criteria, the patient was subsequently transferred to the Orthopaedics/Rehabilitation unit.   The patient received DVT prophylaxis in the form of early mobilization, Lovenox, TED hose, and SCDs . A  sacral pad had been placed and heels were elevated off of the bed with rolled towels in order to protect skin integrity. Foley catheter  was left in place at the direction of urology. Wound drains were discontinued on postoperative day #1. The surgical incision was healing well without signs of infection.  Physical therapy was initiated postoperatively for transfers, gait training, and strengthening. Occupational therapy was initiated for activities of daily living and evaluation for assisted devices. Rehabilitation goals were reviewed in detail with the patient. The patient made steady progress with physical therapy and physical therapy recommended discharge to Home.   The patient achieved the preliminary goals of this hospitalization and was felt to be medically and orthopaedically appropriate for discharge.  He was given perioperative antibiotics:  Anti-infectives (From admission, onward)    Start     Dose/Rate Route Frequency Ordered Stop   03/20/21 2056  ceFAZolin (ANCEF) 2-4 GM/100ML-% IVPB       Note to Pharmacy: Callie Fielding C: cabinet override      03/20/21 2056 03/20/21 2101   03/20/21 1400  ceFAZolin (ANCEF) IVPB 2g/100 mL premix        2 g 200 mL/hr over 30 Minutes Intravenous Every 6 hours 03/20/21 1237 03/20/21 2130   03/20/21 0614  ceFAZolin (ANCEF) 2-4 GM/100ML-% IVPB       Note to Pharmacy: Trudie Reed S: cabinet override      03/20/21 0614 03/20/21 1437   03/20/21 0600  ceFAZolin (ANCEF) IVPB 2g/100 mL premix        2 g 200 mL/hr over 30 Minutes Intravenous On call to O.R. 03/20/21 2423 03/20/21 0849     .  Recent vital signs:  Vitals:   03/21/21 0500 03/21/21 0759  BP: 131/74 118/77  Pulse: 88 79  Resp: 18 20  Temp: 98.8 F (37.1 C) (!) 97.4 F (36.3 C)  SpO2: 97% 95%    Recent laboratory studies:  No results for input(s): WBC, HGB, HCT, PLT, K, CL, CO2, BUN, CREATININE, GLUCOSE, CALCIUM, LABPT, INR in the last 72 hours.  Diagnostic Studies: CUP PACEART REMOTE DEVICE CHECK  Result Date: 02/22/2021 Scheduled remote reviewed. Normal device function.  1 NSVT, 10 beats AMS episodes 2-8sec  in duration, majority with sensing test Next remote 91 days. LR  DG Hip Port Unilat With Pelvis 1V Right  Result Date: 03/20/2021 CLINICAL DATA:  Post total hip arthroplasty (right) EXAM: DG HIP (WITH OR WITHOUT PELVIS) 1V PORT RIGHT COMPARISON:  None. FINDINGS: Status post total right hip arthroplasty. No perihardware lucency is seen to indicate hardware failure or loosening. Expected postoperative changes are seen including lateral surgical skin staples soft tissue swelling, and subcutaneous air. There is a right hip drain. The left femoroacetabular joint space is maintained. A Foley catheter tip overlies the urinary bladder. Left hemipelvis vascular phleboliths. IMPRESSION: Status post total right hip arthroplasty without evidence of hardware failure. Electronically Signed   By: Yvonne Kendall   On: 03/20/2021 13:05    Discharge Medications:   Allergies as of 03/21/2021   No Known Allergies      Medication List     TAKE these medications    celecoxib 200 MG capsule Commonly known as: CELEBREX Take 1 capsule (200 mg total) by mouth 2 (two) times daily.   diphenhydramine-acetaminophen 25-500 MG Tabs tablet Commonly known as: TYLENOL PM Take 1 tablet by mouth at bedtime as needed (sleep).   enoxaparin 40 MG/0.4ML injection Commonly known as: LOVENOX Inject 0.4 mLs (40 mg  total) into the skin daily for 14 days.   Entresto 49-51 MG Generic drug: sacubitril-valsartan TAKE ONE (1) TABLET BY MOUTH TWO TIMES PER DAY   eplerenone 25 MG tablet Commonly known as: INSPRA TAKE ONE TABLET BY MOUTH DAILY   finasteride 5 MG tablet Commonly known as: PROSCAR Take 1 tablet (5 mg total) by mouth at bedtime.   MULTIVITAMIN ADULT PO Take 1 tablet by mouth every morning.   omeprazole 20 MG capsule Commonly known as: PRILOSEC Take 1 capsule (20 mg total) by mouth daily.   oxyCODONE 5 MG immediate release tablet Commonly known as: Oxy IR/ROXICODONE Take 1 tablet (5 mg total) by mouth every  4 (four) hours as needed for severe pain.   sotalol 80 MG tablet Commonly known as: BETAPACE TAKE 1 TABLET TWICE DAILY   tamsulosin 0.4 MG Caps capsule Commonly known as: FLOMAX Take 1 capsule (0.4 mg total) by mouth daily.   traMADol 50 MG tablet Commonly known as: ULTRAM Take 1 tablet (50 mg total) by mouth every 4 (four) hours as needed for moderate pain.               Durable Medical Equipment  (From admission, onward)           Start     Ordered   03/20/21 1204  DME Walker rolling  Once       Question:  Patient needs a walker to treat with the following condition  Answer:  S/P total hip arthroplasty   03/20/21 1203   03/20/21 1204  DME Bedside commode  Once       Question:  Patient needs a bedside commode to treat with the following condition  Answer:  S/P total hip arthroplasty   03/20/21 1203            Disposition: Home with home health PT     Follow-up Information     Hooten, Laurice Record, MD Follow up on 05/02/2021.   Specialty: Orthopedic Surgery Why: at 2:30pm Contact information: [place] [id number] [phone]         Hollice Espy, MD Follow up.   Specialty: Urology Why: 03/26/21 @ 1:45 PM for removal of catheter Contact information: [place] [id number] [place], PA-C 03/21/2021, 10:42 AM

## 2021-03-22 LAB — SURGICAL PATHOLOGY

## 2021-03-25 NOTE — Progress Notes (Incomplete)
° °  03/26/2021 CC: No chief complaint on file.    HPI: Leroy Merritt is a 76 y.o. male who presents today for Foley catheter removal.   He has a personal history of BPH he [place] currently on finasteride and flomax.   He underwent right total hip arthroplasty through posterior approach on 03/20/2021 , there was significant difficulty placing the foley catheter and urology was consulted.   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    There were no vitals filed for this visit. NED. A&Ox3.   No respiratory distress   Abd soft, NT, ND Normal phallus with bilateral descended testicles  Catheter Removal  Patient is present today for a catheter removal.  ***ml of water was drained from the balloon. A ***FR foley cath was removed from the bladder {dnt complications:20057} . Patient tolerated well.  Performed by: ***  Follow up/ Additional notes: ***   Assessment/ Plan:   No follow-ups on file.  I,Kailey Littlejohn,acting as a Education administrator for Hollice Espy, MD.,have documented all relevant documentation on the behalf of Hollice Espy, MD,as directed by  Hollice Espy, MD while in the presence of Hollice Espy, MD.

## 2021-03-26 ENCOUNTER — Other Ambulatory Visit: Payer: Self-pay

## 2021-03-26 ENCOUNTER — Ambulatory Visit: Payer: Medicare HMO | Admitting: Urology

## 2021-03-26 VITALS — BP 154/94 | HR 76 | Ht 70.0 in | Wt 182.0 lb

## 2021-03-26 DIAGNOSIS — N35816 Other urethral stricture, male, overlapping sites: Secondary | ICD-10-CM

## 2021-03-26 MED ORDER — CEPHALEXIN 250 MG PO CAPS
500.0000 mg | ORAL_CAPSULE | Freq: Once | ORAL | Status: AC
  Administered 2021-03-26: 500 mg via ORAL

## 2021-03-26 NOTE — Progress Notes (Signed)
° °  03/26/2021 CC:  Chief Complaint  Patient presents with   Benign Prostatic Hypertrophy     HPI: Leroy Merritt is a 76 y.o. male who presents today for Foley catheter removal.   He has a personal history of BPH he [place] currently on finasteride and flomax.   He underwent right total hip arthroplasty through posterior approach on 03/20/2021 , there was significant difficulty placing the foley catheter and urology was consulted.  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  Stricutre was likely multifocal at the meatus but also at the bulb.   He reports that he has a weak stream.  This been going on for quite some time.  He sternly tried to ignore the problem.  He has not seen or been evaluated by urologist.   Vitals:   03/26/21 1319  BP: (!) 154/94  Pulse: 76   NED. A&Ox3.   No respiratory distress   Abd soft, NT, ND Normal phallus with bilateral descended testicles   1. 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 Foley removed today without difficulty status post urethral dilation in the operating room  Given a dose of prophylactic antibiotics to reduce the risk of infection  We discussed the risk of recurrent stricture, suspect he may have underlying contributing factor including the possibility of BXO  Plan to reassess his voiding symptoms in 6 weeks with IPSS/PVR - cephALEXin (KEFLEX) capsule 500 mg   Return in 6 weeks for IPSS and PVR  I,Kailey Littlejohn,acting as a scribe for Hollice Espy, MD.,have documented all relevant documentation on the behalf of Hollice Espy, MD,as directed by  Hollice Espy, MD while in the presence of Hollice Espy, MD.  I have reviewed the above documentation for accuracy and completeness, and I agree with the above.   Hollice Espy, MD

## 2021-03-26 NOTE — Progress Notes (Signed)
Catheter Removal  Patient is present today for a catheter removal.  9 ml of water was drained from the balloon. A 16 FR foley cath was removed from the bladder no complications were noted . Patient tolerated well.  Performed by: Lesli Albee  Follow up/ Additional notes: 6 weeks as scheduled

## 2021-04-09 ENCOUNTER — Other Ambulatory Visit: Payer: Self-pay | Admitting: Cardiovascular Disease

## 2021-04-09 DIAGNOSIS — I5022 Chronic systolic (congestive) heart failure: Secondary | ICD-10-CM

## 2021-04-09 DIAGNOSIS — I428 Other cardiomyopathies: Secondary | ICD-10-CM

## 2021-04-17 ENCOUNTER — Ambulatory Visit: Payer: Medicare HMO | Admitting: Urology

## 2021-05-02 DIAGNOSIS — Z96641 Presence of right artificial hip joint: Secondary | ICD-10-CM | POA: Diagnosis not present

## 2021-05-08 ENCOUNTER — Other Ambulatory Visit: Payer: Self-pay

## 2021-05-08 ENCOUNTER — Encounter: Payer: Self-pay | Admitting: Urology

## 2021-05-08 ENCOUNTER — Ambulatory Visit: Payer: Medicare HMO | Admitting: Urology

## 2021-05-08 VITALS — BP 156/90 | HR 75 | Ht 70.0 in | Wt 180.0 lb

## 2021-05-08 DIAGNOSIS — N35816 Other urethral stricture, male, overlapping sites: Secondary | ICD-10-CM | POA: Diagnosis not present

## 2021-05-08 LAB — BLADDER SCAN AMB NON-IMAGING: Scan Result: 38

## 2021-05-08 MED ORDER — CLOBETASOL PROPIONATE 0.05 % EX OINT
1.0000 | TOPICAL_OINTMENT | Freq: Two times a day (BID) | CUTANEOUS | 0 refills | Status: DC
Start: 2021-05-08 — End: 2022-02-03

## 2021-05-08 NOTE — Progress Notes (Signed)
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   He underwent right total hip arthroplasty through posterior approach on 03/20/2021 , there was significant difficulty placing the foley catheter and urology was consulted.   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  Stricutre was likely multifocal at the meatus but also at the bulb.    He reports that his urinary issues have improved since last visit. He reports that he is concerned about tightness of his foreskin that has gotten worse in the last year. He denies any urgency and frequency.    IPSS     Row Name 05/08/21 1300         International Prostate Symptom Score   How often have you had the sensation of not emptying your bladder? Not at All     How often have you had to urinate less than every two hours? Less than 1 in 5 times     How often have you found you stopped and started again several times when you urinated? Not at All     How often have you found it difficult to postpone urination? Not at All     How often have you had a weak urinary stream? Less than 1 in 5 times     How often have you had to strain to start urination? Less than half the time     How many times did you typically get up at night to urinate? 2 Times     Total IPSS Score 6       Quality of Life due to urinary symptoms   If you were to spend the rest of your life with your urinary condition just the way it is now how would you feel about that? 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   Cataract    Diverticulosis 2017   Dysphagia 12/20/2014   GERD (gastroesophageal reflux disease)    HFrEF (heart failure with reduced ejection fraction) ([place]) 04/29/2017   a.) TTE 04/29/2017: mod-sev LV ysfunction; EF 30-35%; global HK; G1DD. b.) Cardiac MRI 04/29/2017: biventricular/biatrial enlargement; LVEF 27%; global HK.   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  Status post SJM dual-chamber ICD in 04/2017    Surgical History: Past Surgical History:  Procedure Laterality Date   CARDIAC CATHETERIZATION  04/28/2017   CATARACT EXTRACTION W/ INTRAOCULAR  LENS IMPLANT Right 2017   CATARACT EXTRACTION W/PHACO Left 05/20/2019   Procedure: CATARACT EXTRACTION PHACO AND INTRAOCULAR LENS PLACEMENT ([place]) LEFT;  Surgeon: Birder Robson, MD;  Location: [facility];  Service: Ophthalmology;  Laterality: Left;  [place] 01:19.7 CDE 14.58 Fluid Pack Lot # [id number]   ESOPHAGOGASTRODUODENOSCOPY (EGD) WITH PROPOFOL N/A 02/19/2015   Procedure: ESOPHAGOGASTRODUODENOSCOPY (EGD) WITH PROPOFOL and esophageal dilation.;  Surgeon: Lucilla Lame, MD;  Location: [facility];  Service: Endoscopy;  Laterality: N/A;   EYE SURGERY     ICD IMPLANT N/A 04/29/2017   Procedure: ICD IMPLANT;  Surgeon: Evans Lance, MD;  Location: [facility];  Service: Cardiovascular;  Laterality: N/A;   LEFT HEART CATH AND CORONARY ANGIOGRAPHY N/A 04/28/2017   Procedure: LEFT  HEART CATH AND CORONARY ANGIOGRAPHY;  Surgeon: Martinique, Peter M, MD;  Location: [facility];  Service: Cardiovascular;  Laterality: N/A;   PROSTATE BIOPSY  ~ 2012   RETINAL DETACHMENT SURGERY Right 1990s   "torn retina"   TOTAL HIP ARTHROPLASTY Right 03/20/2021   Procedure: TOTAL HIP ARTHROPLASTY;  Surgeon: Dereck Leep, MD;  Location: [facility];  Service: Orthopedics;  Laterality: Right;    Home Medications:  Allergies as of 05/08/2021   No Known Allergies      Medication List        Accurate as of May 08, 2021  1:54 PM. If you have any questions, ask your nurse or doctor.          STOP taking these medications    celecoxib 200 MG capsule Commonly known as: CELEBREX Stopped by: Hollice Espy, MD   enoxaparin 40 MG/0.4ML injection Commonly known as: LOVENOX Stopped by: Hollice Espy, MD   oxyCODONE 5 MG immediate release tablet Commonly known as: Oxy IR/ROXICODONE Stopped by: Hollice Espy, MD   traMADol 50 MG tablet Commonly known as: ULTRAM Stopped by: Hollice Espy, MD       TAKE these medications    diphenhydramine-acetaminophen 25-500 MG Tabs tablet Commonly known as: TYLENOL PM Take 1 tablet by mouth at bedtime as needed (sleep).   Entresto 49-51 MG Generic drug: sacubitril-valsartan TAKE ONE (1) TABLET BY MOUTH TWO TIMES PER DAY   eplerenone 25 MG tablet Commonly known as: INSPRA TAKE ONE TABLET BY MOUTH DAILY   finasteride 5 MG tablet Commonly known as: PROSCAR Take 1 tablet (5 mg total) by mouth at bedtime.   MULTIVITAMIN ADULT PO Take 1 tablet by mouth every morning.   omeprazole 20 MG capsule Commonly known as: PRILOSEC Take 1 capsule (20 mg total) by mouth daily.   sotalol 80 MG tablet Commonly known as: BETAPACE TAKE 1 TABLET TWICE DAILY   tamsulosin 0.4 MG Caps capsule Commonly known as: FLOMAX Take 1 capsule (0.4 mg total) by mouth daily.        Allergies: No Known Allergies  Family History: Family History   Problem Relation Age of Onset   Arthritis Mother    Diabetes Father    Hypertension Father    Heart disease Brother    Hyperlipidemia Brother    Hypertension Brother    Lung cancer Daughter    Cancer Daughter     Social History:  reports that he has never smoked. He has never used smokeless tobacco. He reports current alcohol use of about 1.0 standard drink per week. He reports that he does not use drugs.   Physical Exam: BP (!) 156/90    Pulse 75    Ht 5\' 10"  (  1.778 m)    Wt 180 lb (81.6 kg)    BMI 25.83 kg/m   Constitutional:  Alert and oriented, No acute distress. HEENT: [place] AT, moist mucus membranes.  Trachea midline, no masses. Cardiovascular: No clubbing, cyanosis, or edema. Respiratory: Normal respiratory effort, no increased work of breathing. GU: whitish sclerotic appearence of glands with a tight foreskin but ultimately reducible, meatus was ventricularly located closer to coronal margin and it was stenotic   Skin: No rashes, bruises or suspicious lesions. Neurologic: Grossly intact, no focal deficits, moving all 4 extremities. Psychiatric: Normal mood and affect.  Laboratory Data:  Lab Results  Component Value Date   CREATININE 1.09 01/29/2021   Pertinent Imaging:  Results for orders placed or performed in visit on 05/08/21  Bladder Scan (Post Void Residual) in office  Result Value Ref Range   Scan Result 38 ml     Assessment & Plan:    Urethral stricture  - S/p urethral dilation  - He is emptying adequately today - He has had improvement in his symptoms - We discussed self catheterization to keep urethra open. 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  Discussed the pathophysiology of this  - discussed treatment such as circumcision versus steroid cream - Clobetasol ointment prescribed  today  Return for CIC teaching with PA  I,Kailey Littlejohn,acting as a scribe for Hollice Espy, MD.,have documented all relevant documentation on the behalf of Hollice Espy, MD,as directed by  Hollice Espy, MD while in the presence of Hollice Espy, MD.  I have reviewed the above documentation for accuracy and completeness, and I agree with the above.   Hollice Espy, MD   [facility] [address], [place], [place] [address] [phone]

## 2021-05-24 ENCOUNTER — Ambulatory Visit (INDEPENDENT_AMBULATORY_CARE_PROVIDER_SITE_OTHER): Payer: Medicare HMO

## 2021-05-24 DIAGNOSIS — I420 Dilated cardiomyopathy: Secondary | ICD-10-CM | POA: Diagnosis not present

## 2021-05-24 LAB — CUP PACEART REMOTE DEVICE CHECK
Battery Remaining Longevity: 56 mo
Battery Remaining Percentage: 59 %
Battery Voltage: 2.96 V
Brady Statistic AP VP Percent: 1 %
Brady Statistic AP VS Percent: 2.8 %
Brady Statistic AS VP Percent: 2.6 %
Brady Statistic AS VS Percent: 94 %
Brady Statistic RA Percent Paced: 1.7 %
Brady Statistic RV Percent Paced: 2.8 %
Date Time Interrogation Session: 20230317050015
HighPow Impedance: 84 Ohm
HighPow Impedance: 84 Ohm
Implantable Lead Implant Date: 20190220
Implantable Lead Implant Date: 20190220
Implantable Lead Location: 753859
Implantable Lead Location: 753860
Implantable Lead Model: 7122
Implantable Pulse Generator Implant Date: 20190220
Lead Channel Impedance Value: 390 Ohm
Lead Channel Impedance Value: 430 Ohm
Lead Channel Pacing Threshold Amplitude: 0.75 V
Lead Channel Pacing Threshold Amplitude: 1 V
Lead Channel Pacing Threshold Pulse Width: 0.5 ms
Lead Channel Pacing Threshold Pulse Width: 0.5 ms
Lead Channel Sensing Intrinsic Amplitude: 1.3 mV
Lead Channel Sensing Intrinsic Amplitude: 11.8 mV
Lead Channel Setting Pacing Amplitude: 2 V
Lead Channel Setting Pacing Amplitude: 2.5 V
Lead Channel Setting Pacing Pulse Width: 0.5 ms
Lead Channel Setting Sensing Sensitivity: 0.5 mV
Pulse Gen Serial Number: 9787528

## 2021-05-31 NOTE — Progress Notes (Signed)
Remote ICD transmission.   

## 2021-06-05 ENCOUNTER — Ambulatory Visit: Payer: Medicare HMO | Admitting: Urology

## 2021-06-06 NOTE — Progress Notes (Addendum)
Continuous Intermittent Catheterization Teaching with I&O Catheterization due to Stricture  Due to urethral stricture and urinary retention due to stricture patient is present today for a teaching of self I & O Catheterization. Patient was given detailed verbal and printed instructions of self catheterization. Patient was cleaned and prepped in a clean fashion.    Patient initially attempted insertion of 16Fr, 14Fr, and 12Fr straight tip catheters due to fossa navicularis stricture, however he was unable to pass these catheters to the level of the bladder. I cleaned and prepped the patient in a sterile fashion with betadine and instilled 2% lidocaine jelly into the meatus. I was ultimately able to advance a 12Fr coude silicone Foley catheter to the level of the bladder, meeting some resistance along the fossa navicularis. Minimal bleeding noted; patient tolerated well.  Catheter was subsequently removed and with instruction and assistance patient inserted a 12FR [employer] SpeediCath Standard straight tip to the level of the bladder; urine return was noted 150 ml, urine was yellow in color. Patient tolerated well, no complications were noted. Patient was given a sample bag with supplies to take home.  Instructions were given for patient to cath daily x4 weeks, then every other day x4 weeks, then twice weekly x4 weeks, then weekly thereafter.  An order was placed with [employer] for catheters to be sent to the patient's home. Patient is to follow up PRN.  Bactrim DS x1 dose administered in clinic for UTI prevention.  Performed by: Carman Ching, PA-C

## 2021-06-07 ENCOUNTER — Ambulatory Visit: Payer: Medicare HMO | Admitting: Physician Assistant

## 2021-06-07 ENCOUNTER — Encounter: Payer: Self-pay | Admitting: Physician Assistant

## 2021-06-07 VITALS — BP 152/91 | HR 68 | Ht 70.0 in | Wt 180.0 lb

## 2021-06-07 DIAGNOSIS — N35816 Other urethral stricture, male, overlapping sites: Secondary | ICD-10-CM | POA: Diagnosis not present

## 2021-06-07 DIAGNOSIS — R339 Retention of urine, unspecified: Secondary | ICD-10-CM

## 2021-06-07 MED ORDER — SULFAMETHOXAZOLE-TRIMETHOPRIM 800-160 MG PO TABS
1.0000 | ORAL_TABLET | Freq: Once | ORAL | Status: AC
  Administered 2021-06-07: 1 via ORAL

## 2021-06-07 NOTE — Patient Instructions (Addendum)
Please start the following self-catheterization schedule to keep your areas of stricture open: ?-Once daily for 4 weeks, then ?-Every other day for 4 weeks, then ?-Twice a week for 4 weeks, then ?-Once weekly thereafter ? ? ? ? ?Step 1 Get all of your supplies ready and place near you. ?Step 2 Wash your hands, or put on gloves. ?Step 3 Wash around the tip of your penis with warm antibacterial soapy water. ?Step 4 Take catheter out of package and drain the lubricant over toilet. ?Step 5 While holding the penis at a 45 degree angle from the stomach in one hand and the catheter in the other hand  ?Step 6 Insert the catheter slowly into your urethra. If there is resistance when the catheter reaches the sphincter muscle,  ?            take a deep breath and gently apply steady pressure.  ?            DO NOT FORCE THE CATHETER ?Step 7 When the urine begins to flow insert another inch and lower penis. Allow the urine to flow into the toilet. ?Step 8 When the flow of urine stops, slowly remove the catheter.  ?

## 2021-06-18 DIAGNOSIS — Z96641 Presence of right artificial hip joint: Secondary | ICD-10-CM | POA: Diagnosis not present

## 2021-06-18 DIAGNOSIS — M1712 Unilateral primary osteoarthritis, left knee: Secondary | ICD-10-CM | POA: Diagnosis not present

## 2021-07-10 ENCOUNTER — Other Ambulatory Visit: Payer: Self-pay | Admitting: Internal Medicine

## 2021-07-10 ENCOUNTER — Other Ambulatory Visit: Payer: Self-pay | Admitting: Family Medicine

## 2021-07-10 DIAGNOSIS — N401 Enlarged prostate with lower urinary tract symptoms: Secondary | ICD-10-CM

## 2021-07-10 NOTE — Telephone Encounter (Signed)
Requested Prescriptions  ?Pending Prescriptions Disp Refills  ?? finasteride (PROSCAR) 5 MG tablet [Pharmacy Med Name: FINASTERIDE 5 MG Tablet] 90 tablet 1  ?  Sig: TAKE 1 TABLET AT BEDTIME  ?  ? Urology: 5-alpha Reductase Inhibitors Passed - 07/10/2021 10:07 AM  ?  ?  Passed - PSA in normal range and within 360 days  ?  Prostate Specific Ag, Serum  ?Date Value Ref Range Status  ?12/18/2020 1.4 0.0 - 4.0 ng/mL Final  ?  Comment:  ?  Roche ECLIA methodology. ?According to the American Urological Association, Serum PSA should ?decrease and remain at undetectable levels after radical ?prostatectomy. The AUA defines biochemical recurrence as an initial ?PSA value 0.2 ng/mL or greater followed by a subsequent confirmatory ?PSA value 0.2 ng/mL or greater. ?Values obtained with different assay methods or kits cannot be used ?interchangeably. Results cannot be interpreted as absolute evidence ?of the presence or absence of malignant disease. ?  ?   ?  ?  Passed - Valid encounter within last 12 months  ?  Recent Outpatient Visits   ?      ? 6 months ago Wellness examination  ? [place], DO  ? 1 year ago Routine general medical examination at a health care facility  ? [place], Megan P, DO  ? 4 years ago Benign prostatic hyperplasia with urinary frequency  ? [facility] [place], Jeannette How, MD  ? 5 years ago Benign prostatic hyperplasia with urinary frequency  ? [facility] Jeananne Rama, Jeannette How, MD  ? 6 years ago Dysphagia  ? [facility] Jeananne Rama, Jeannette How, MD  ?  ?  ?Future Appointments   ?        ? In 10 months Hollice Espy, MD [place]  ?  ? ?  ?  ?  ?? tamsulosin (FLOMAX) 0.4 MG CAPS capsule [Pharmacy Med Name: TAMSULOSIN HYDROCHLORIDE 0.4 MG Capsule] 90 capsule 1  ?  Sig: TAKE 1 CAPSULE EVERY DAY  ?  ? Urology: Alpha-Adrenergic Blocker Failed - 07/10/2021 10:07 AM  ?  ?  Failed - Last BP in normal range  ?  BP  Readings from Last 1 Encounters:  ?06/07/21 (!) 152/91  ?   ?  ?  Passed - PSA in normal range and within 360 days  ?  Prostate Specific Ag, Serum  ?Date Value Ref Range Status  ?12/18/2020 1.4 0.0 - 4.0 ng/mL Final  ?  Comment:  ?  Roche ECLIA methodology. ?According to the American Urological Association, Serum PSA should ?decrease and remain at undetectable levels after radical ?prostatectomy. The AUA defines biochemical recurrence as an initial ?PSA value 0.2 ng/mL or greater followed by a subsequent confirmatory ?PSA value 0.2 ng/mL or greater. ?Values obtained with different assay methods or kits cannot be used ?interchangeably. Results cannot be interpreted as absolute evidence ?of the presence or absence of malignant disease. ?  ?   ?  ?  Passed - Valid encounter within last 12 months  ?  Recent Outpatient Visits   ?      ? 6 months ago Wellness examination  ? [place], DO  ? 1 year ago Routine general medical examination at a health care facility  ? [place], Megan P, DO  ? 4 years ago Benign prostatic hyperplasia with urinary frequency  ? [facility] Jeananne Rama, Jeannette How, MD  ? 5 years  ago Benign prostatic hyperplasia with urinary frequency  ? [facility] Jeananne Rama, Jeannette How, MD  ? 6 years ago Dysphagia  ? [facility] Jeananne Rama, Jeannette How, MD  ?  ?  ?Future Appointments   ?        ? In 10 months Hollice Espy, MD [place]  ?  ? ?  ?  ?  ? ? ?

## 2021-07-22 ENCOUNTER — Telehealth: Payer: Self-pay | Admitting: Cardiovascular Disease

## 2021-07-22 ENCOUNTER — Other Ambulatory Visit: Payer: Self-pay

## 2021-07-22 DIAGNOSIS — I472 Ventricular tachycardia, unspecified: Secondary | ICD-10-CM

## 2021-07-22 MED ORDER — SOTALOL HCL 80 MG PO TABS: 80.0000 mg | ORAL_TABLET | Freq: Two times a day (BID) | ORAL | 0 refills | Status: DC

## 2021-07-22 NOTE — Telephone Encounter (Signed)
?*  STAT* If patient is at the pharmacy, call can be transferred to refill team. ? ? ?1. Which medications need to be refilled? (please list name of each medication and dose if known)  ?sotalol (BETAPACE) 80 MG tablet ? ?2. Which pharmacy/location (including street and city if local pharmacy) is medication to be sent to? ?[place], [place] ? ?3. Do they need a 30 day or 90 day supply?  ? ?Patient is requesting a 1 week supply to last him until his mail order arrives.  ? ?

## 2021-07-23 MED ORDER — SOTALOL HCL 80 MG PO TABS: 80.0000 mg | ORAL_TABLET | Freq: Two times a day (BID) | ORAL | 0 refills | Status: DC

## 2021-07-23 NOTE — Addendum Note (Signed)
Addended by: Raelene Bott, Katoria Yetman L on: 07/23/2021 09:08 AM ? ? Modules accepted: Orders ? ?

## 2021-07-23 NOTE — Telephone Encounter (Signed)
Requested Prescriptions  ? ?Signed Prescriptions Disp Refills  ? sotalol (BETAPACE) 80 MG tablet 14 tablet 0  ?  Sig: Take 1 tablet (80 mg total) by mouth 2 (two) times daily.  ?  Authorizing Provider: [name]  ?  Ordering User: Raelene Bott, Jahari Billy L  ? ? ?

## 2021-07-26 NOTE — Discharge Instructions (Signed)
Instructions after Total Knee Replacement   Domingo Fuson P. Lakeva Hollon, Jr., M.D.     Dept. of Orthopaedics & Sports Medicine  [facility]  [address]  [place], [place]  [address]  Phone: [phone]   Fax: [phone]    DIET: Drink plenty of non-alcoholic fluids. Resume your normal diet. Include foods high in fiber.  ACTIVITY:  You may use crutches or a walker with weight-bearing as tolerated, unless instructed otherwise. You may be weaned off of the walker or crutches by your Physical Therapist.  Do NOT place pillows under the knee. Anything placed under the knee could limit your ability to straighten the knee.   Continue doing gentle exercises. Exercising will reduce the pain and swelling, increase motion, and prevent muscle weakness.   Please continue to use the TED compression stockings for 6 weeks. You may remove the stockings at night, but should reapply them in the morning. Do not drive or operate any equipment until instructed.  WOUND CARE:  Continue to use the PolarCare or ice packs periodically to reduce pain and swelling. You may bathe or shower after the staples are removed at the first office visit following surgery.  MEDICATIONS: You may resume your regular medications. Please take the pain medication as prescribed on the medication. Do not take pain medication on an empty stomach. You have been given a prescription for a blood thinner (Lovenox or Coumadin). Please take the medication as instructed. (NOTE: After completing a 2 week course of Lovenox, take one Enteric-coated aspirin once a day. This along with elevation will help reduce the possibility of phlebitis in your operated leg.) Do not drive or drink alcoholic beverages when taking pain medications.  CALL THE OFFICE FOR: Temperature above 101 degrees Excessive bleeding or drainage on the dressing. Excessive swelling, coldness, or paleness of the toes. Persistent nausea and vomiting.  FOLLOW-UP:  You  should have an appointment to return to the office in 10-14 days after surgery. Arrangements have been made for continuation of Physical Therapy (either home therapy or outpatient therapy).   [facility] Department Directory         [email or web address]       [email or web address]          Cardiology  Appointments: [place] - [phone] [place] - [phone]  Endocrinology  Appointments: [place] - [phone] [place] - [phone]  Gastroenterology  Appointments: [place] - [phone] [place] - [phone]        General Surgery   Appointments: [place] - [phone]  Internal Medicine/Family Medicine  Appointments: [place] - [phone] [place] - [phone] [place] - [phone]  Metabolic and Weigh Loss Surgery  Appointments: [place] - [phone]        Neurology  Appointments: [place] - [phone] [place] - [phone]  Neurosurgery  Appointments: [place] - [phone]  Obstetrics & Gynecology  Appointments: [place] - [phone] [place] - [phone]        Pediatrics  Appointments: [place] - [phone] [place] - [phone]  Physiatry  Appointments: [place] -[phone]  Physical Therapy  Appointments: [place] - [phone] [place] - [phone]        Podiatry  Appointments: [place] - [phone] [place] - [phone]  Pulmonology  Appointments: [place] - [phone]  Rheumatology  Appointments:  - [phone]         Location: [facility]  [address] , [place]  [address]  [place] Location: [facility] [address] [place], [place]  [address]  [place] Location: [facility] [address] [place], [place]  [address]    

## 2021-07-31 ENCOUNTER — Encounter: Payer: Self-pay | Admitting: Internal Medicine

## 2021-07-31 ENCOUNTER — Encounter
Admission: RE | Admit: 2021-07-31 | Discharge: 2021-07-31 | Disposition: A | Discharge status: Home / Self Care | Payer: Medicare HMO | Source: Ambulatory Visit | Attending: Orthopedic Surgery | Admitting: Orthopedic Surgery

## 2021-07-31 ENCOUNTER — Telehealth: Payer: Self-pay | Admitting: *Deleted

## 2021-07-31 VITALS — BP 131/77 | HR 65 | Resp 16 | Ht 70.0 in | Wt 182.4 lb

## 2021-07-31 DIAGNOSIS — I5022 Chronic systolic (congestive) heart failure: Secondary | ICD-10-CM | POA: Diagnosis not present

## 2021-07-31 DIAGNOSIS — I2510 Atherosclerotic heart disease of native coronary artery without angina pectoris: Secondary | ICD-10-CM | POA: Diagnosis not present

## 2021-07-31 DIAGNOSIS — Z01812 Encounter for preprocedural laboratory examination: Secondary | ICD-10-CM | POA: Insufficient documentation

## 2021-07-31 DIAGNOSIS — I420 Dilated cardiomyopathy: Secondary | ICD-10-CM | POA: Diagnosis not present

## 2021-07-31 DIAGNOSIS — M1712 Unilateral primary osteoarthritis, left knee: Secondary | ICD-10-CM | POA: Diagnosis not present

## 2021-07-31 DIAGNOSIS — Z01818 Encounter for other preprocedural examination: Secondary | ICD-10-CM

## 2021-07-31 HISTORY — DX: Crossing vessel and stricture of ureter without hydronephrosis: N13.5

## 2021-07-31 HISTORY — DX: Esophagitis, unspecified without bleeding: K20.90

## 2021-07-31 HISTORY — DX: Chronic systolic (congestive) heart failure: I50.22

## 2021-07-31 HISTORY — DX: Presence of automatic (implantable) cardiac defibrillator: Z95.810

## 2021-07-31 HISTORY — DX: Dilated cardiomyopathy: I42.0

## 2021-07-31 HISTORY — DX: Palpitations: R00.2

## 2021-07-31 HISTORY — DX: Atrioventricular block, first degree: I44.0

## 2021-07-31 HISTORY — DX: Esophageal obstruction: K22.2

## 2021-07-31 LAB — CBC
HCT: 47.3 % (ref 39.0–52.0)
Hemoglobin: 15.4 g/dL (ref 13.0–17.0)
MCH: 28.8 pg (ref 26.0–34.0)
MCHC: 32.6 g/dL (ref 30.0–36.0)
MCV: 88.6 fL (ref 80.0–100.0)
Platelets: 149 10*3/uL — ABNORMAL LOW (ref 150–400)
RBC: 5.34 MIL/uL (ref 4.22–5.81)
RDW: 13.8 % (ref 11.5–15.5)
WBC: 6.6 10*3/uL (ref 4.0–10.5)
nRBC: 0 % (ref 0.0–0.2)

## 2021-07-31 LAB — URINALYSIS, ROUTINE W REFLEX MICROSCOPIC
Bilirubin Urine: NEGATIVE
Glucose, UA: NEGATIVE mg/dL
Hgb urine dipstick: NEGATIVE
Ketones, ur: NEGATIVE mg/dL
Leukocytes,Ua: NEGATIVE
Nitrite: NEGATIVE
Protein, ur: NEGATIVE mg/dL
Specific Gravity, Urine: 1.021 (ref 1.005–1.030)
pH: 5 (ref 5.0–8.0)

## 2021-07-31 LAB — COMPREHENSIVE METABOLIC PANEL
ALT: 12 U/L (ref 0–44)
AST: 15 U/L (ref 15–41)
Albumin: 3.9 g/dL (ref 3.5–5.0)
Alkaline Phosphatase: 74 U/L (ref 38–126)
Anion gap: 8 (ref 5–15)
BUN: 19 mg/dL (ref 8–23)
CO2: 25 mmol/L (ref 22–32)
Calcium: 9 mg/dL (ref 8.9–10.3)
Chloride: 107 mmol/L (ref 98–111)
Creatinine, Ser: 1.01 mg/dL (ref 0.61–1.24)
GFR, Estimated: >60 mL/min (ref >60)
Glucose, Bld: 88 mg/dL (ref 70–99)
Potassium: 4.1 mmol/L (ref 3.5–5.1)
Sodium: 140 mmol/L (ref 135–145)
Total Bilirubin: 1.1 mg/dL (ref 0.3–1.2)
Total Protein: 6.9 g/dL (ref 6.5–8.1)

## 2021-07-31 LAB — SURGICAL PCR SCREEN
MRSA, PCR: NEGATIVE
Staphylococcus aureus: NEGATIVE

## 2021-07-31 LAB — TYPE AND SCREEN
ABO/RH(D): A NEG
Antibody Screen: NEGATIVE

## 2021-07-31 LAB — SEDIMENTATION RATE: Sed Rate: 2 mm/hr (ref 0–20)

## 2021-07-31 LAB — C-REACTIVE PROTEIN: CRP: 0.6 mg/dL (ref <1.0)

## 2021-07-31 NOTE — Telephone Encounter (Signed)
Request for pre-operative cardiac clearance Received: Today Leroy Kitchens, NP  P Cv Div Preop Callback Request for pre-operative cardiac clearance:     1. What type of surgery is being performed?  LEFT COMPUTER ASSISTED TOTAL KNEE ARTHROPLASTY   2. When is this surgery scheduled?  08/12/2021     3. Type of clearance being requested (medical, pharmacy, both).  MEDICAL     4. Are there any medications that need to be held prior to surgery?  NONE   5. Practice name and name of physician performing surgery?  Performing surgeon: Dr. Skip Estimable, MD  Requesting clearance: Leroy Loh, FNP-C       6. Anesthesia type (none, local, MAC, general)?  GENERAL   7. What is the office phone and fax number?    Phone: [phone]  Fax: [phone]   ATTENTION: Unable to create telephone message as per your standard workflow. Directed by [facility] providers to send requests for cardiac clearance to this pool for appropriate distribution to provider covering pre-operative clearances.   Leroy Loh, MSN, APRN, FNP-C, CEN  [facility]  Peri-operative Services Nurse Practitioner  Phone: [phone]  07/31/21 12:08 PM

## 2021-07-31 NOTE — Progress Notes (Signed)
[facility] DEVICE PROGRAMMING  Patient Information: Name:  Leroy Merritt  DOB:  1945/09/10  MRN:  [id number]  Planned Procedure: LEFT COMPUTER ASSISTED TOTAL KNEE ARTHROPLASTY    Surgeon:  Dr. Skip Estimable, MD  Requesting device clearance: Honor Loh, FNP-C  Date of Procedure:  08/12/2021  Cautery will be used.   Please route documentation back me via [facility], or may fax report to [facility] PAT APP at [phone].   Device Information:  Clinic EP Physician:  Virl Axe, MD   Device Type:  Defibrillator Manufacturer and Phone #:  St. Jude/Abbott: [phone] Pacemaker Dependent?:  No. Date of Last Device Check:  05/24/2021 Normal Device Function?:  Yes.    Electrophysiologist's Recommendations:  Have magnet available. Provide continuous ECG monitoring when magnet is used or reprogramming is to be performed.  Procedure should not interfere with device function.  No device programming or magnet placement needed.  Per Device Clinic Standing Orders, Damian Leavell, RN  2:07 PM 07/31/2021

## 2021-07-31 NOTE — Telephone Encounter (Signed)
Left message to call back to schedule a tele pre op appt.  

## 2021-07-31 NOTE — Patient Instructions (Addendum)
Your procedure is scheduled on:08-12-21 Monday Report to the Registration Desk on the 1st floor of the [facility].Then proceed to the 2nd floor Surgery Desk To find out your arrival time, please call [phone] between 1PM - 3PM on:08-09-21 Friday If your arrival time is 6:00 am, do not arrive prior to that time as the [facility] entrance doors do not open until 6:00 am.  REMEMBER: Instructions that are not followed completely may result in serious medical risk, up to and including death; or upon the discretion of your surgeon and anesthesiologist your surgery may need to be rescheduled.  Do not eat food after midnight the night before surgery.  No gum chewing, lozengers or hard candies.  You may however, drink CLEAR liquids up to 2 hours before you are scheduled to arrive for your surgery. Do not drink anything within 2 hours of your scheduled arrival time.  Clear liquids include: - water  - apple juice without pulp - gatorade (not RED colors) - black coffee or tea (Do NOT add milk or creamers to the coffee or tea) Do NOT drink anything that is not on this list  In addition, your doctor has ordered for you to drink the provided  Ensure Pre-Surgery Clear Carbohydrate Drink  Drinking this carbohydrate drink up to two hours before surgery helps to reduce insulin resistance and improve patient outcomes. Please complete drinking 2 hours prior to scheduled arrival time.  TAKE THESE MEDICATIONS THE MORNING OF SURGERY WITH A SIP OF WATER: -sotalol (BETAPACE)  -omeprazole (PRILOSEC)-take one the night before surgery and one the morning of surgery  One week prior to surgery:Last dose on 08-04-21 Stop Anti-inflammatories (NSAIDS) such as Advil, Aleve, Ibuprofen, Motrin, Naproxen, Naprosyn and Aspirin based products such as Excedrin, Goodys Powder, BC Powder.You may however, take Tylenol if needed for pain up until the day of surgery.  Stop ANY OVER THE COUNTER supplements/vitamins 7 days  prior to surgery (Multivitamin)  No Alcohol for 24 hours before or after surgery.  No Smoking including e-cigarettes for 24 hours prior to surgery.  No chewable tobacco products for at least 6 hours prior to surgery.  No nicotine patches on the day of surgery.  Do not use any "recreational" drugs for at least a week prior to your surgery.  Please be advised that the combination of cocaine and anesthesia may have negative outcomes, up to and including death. If you test positive for cocaine, your surgery will be cancelled.  On the morning of surgery brush your teeth with toothpaste and water, you may rinse your mouth with mouthwash if you wish. Do not swallow any toothpaste or mouthwash.  Use CHG Soap as directed on instruction sheet.  Do not wear jewelry, make-up, hairpins, clips or nail polish.  Do not wear lotions, powders, or perfumes.   Do not shave body from the neck down 48 hours prior to surgery just in case you cut yourself which could leave a site for infection.  Also, freshly shaved skin may become irritated if using the CHG soap.  Contact lenses, hearing aids and dentures may not be worn into surgery.  Do not bring valuables to the hospital. [facility] is not responsible for any missing/lost belongings or valuables.   Notify your doctor if there is any change in your medical condition (cold, fever, infection).  Wear comfortable clothing (specific to your surgery type) to the hospital.  After surgery, you can help prevent lung complications by doing breathing exercises.  Take deep  breaths and cough every 1-2 hours. Your doctor may order a device called an Incentive Spirometer to help you take deep breaths. When coughing or sneezing, hold a pillow firmly against your incision with both hands. This is called "splinting." Doing this helps protect your incision. It also decreases belly discomfort.  If you are being admitted to the hospital overnight, leave your suitcase in  the car. After surgery it may be brought to your room.  If you are being discharged the day of surgery, you will not be allowed to drive home. You will need a responsible adult (18 years or older) to drive you home and stay with you that night.   If you are taking public transportation, you will need to have a responsible adult (18 years or older) with you. Please confirm with your physician that it is acceptable to use public transportation.   Please call the [facility]. at [phone] if you have any questions about these instructions.  Surgery Visitation Policy:  Patients undergoing a surgery or procedure may have two family members or support persons with them as long as the person is not COVID-19 positive or experiencing its symptoms.   Inpatient Visitation:    Visiting hours are 7 a.m. to 8 p.m. Up to four visitors are allowed at one time in a patient room, including children. The visitors may rotate out with other people during the day. One designated support person (adult) may remain overnight.

## 2021-07-31 NOTE — Telephone Encounter (Signed)
No request to hold DAPT or anticoagulants.  Preoperative team, please contact this patient and set up a phone call appointment for further cardiac evaluation.  Thank you for your help.  Jossie Ng. Lillianah Swartzentruber NP-C    07/31/2021, 12:56 PM [address] [place] [address] Office [phone] Fax [phone]

## 2021-08-01 ENCOUNTER — Telehealth: Payer: Self-pay | Admitting: *Deleted

## 2021-08-01 NOTE — Telephone Encounter (Signed)
Pt agreeable to plan of care for tele pre op appt 08/07/21 @ 1 pm. Med rec and consent are done.

## 2021-08-01 NOTE — Telephone Encounter (Signed)
Pt agreeable to plan of care for tele pre op appt 08/07/21 @ 1 pm. Med rec and consent are done.    Patient Consent for Virtual Visit        Leroy Merritt has provided verbal consent on 08/01/2021 for a virtual visit (video or telephone).   CONSENT FOR VIRTUAL VISIT FOR:  Leroy Merritt  By participating in this virtual visit I agree to the following:  I hereby voluntarily request, consent and authorize [facility] and its employed or contracted physicians, physician assistants, nurse practitioners or other licensed health care professionals (the Practitioner), to provide me with telemedicine health care services (the "Services") as deemed necessary by the treating Practitioner. I acknowledge and consent to receive the Services by the Practitioner via telemedicine. I understand that the telemedicine visit will involve communicating with the Practitioner through live audiovisual communication technology and the disclosure of certain medical information by electronic transmission. I acknowledge that I have been given the opportunity to request an in-person assessment or other available alternative prior to the telemedicine visit and am voluntarily participating in the telemedicine visit.  I understand that I have the right to withhold or withdraw my consent to the use of telemedicine in the course of my care at any time, without affecting my right to future care or treatment, and that the Practitioner or I may terminate the telemedicine visit at any time. I understand that I have the right to inspect all information obtained and/or recorded in the course of the telemedicine visit and may receive copies of available information for a reasonable fee.  I understand that some of the potential risks of receiving the Services via telemedicine include:  Delay or interruption in medical evaluation due to technological equipment failure or disruption; Information transmitted may not be sufficient (e.g. poor  resolution of images) to allow for appropriate medical decision making by the Practitioner; and/or  In rare instances, security protocols could fail, causing a breach of personal health information.  Furthermore, I acknowledge that it is my responsibility to provide information about my medical history, conditions and care that is complete and accurate to the best of my ability. I acknowledge that Practitioner's advice, recommendations, and/or decision may be based on factors not within their control, such as incomplete or inaccurate data provided by me or distortions of diagnostic images or specimens that may result from electronic transmissions. I understand that the practice of medicine is not an exact science and that Practitioner makes no warranties or guarantees regarding treatment outcomes. I acknowledge that a copy of this consent can be made available to me via my patient portal ([facility]), or I can request a printed copy by calling the office of [facility].    I understand that my insurance will be billed for this visit.   I have read or had this consent read to me. I understand the contents of this consent, which adequately explains the benefits and risks of the Services being provided via telemedicine.  I have been provided ample opportunity to ask questions regarding this consent and the Services and have had my questions answered to my satisfaction. I give my informed consent for the services to be provided through the use of telemedicine in my medical care

## 2021-08-01 NOTE — Telephone Encounter (Signed)
Follow Up:     Patient is returning Leroy Merritt's call from yesterday.

## 2021-08-06 ENCOUNTER — Encounter: Payer: Self-pay | Admitting: Orthopedic Surgery

## 2021-08-06 NOTE — Progress Notes (Signed)
Perioperative Services  Pre-Admission/Anesthesia Testing Clinical Review  Date: 08/06/21  Patient Demographics:  Name: Leroy Merritt DOB:   05/30/1945 MRN:   [id number]  Planned Surgical Procedure(s):    Case: [id number] Date/Time: 08/12/21 1115   Procedure: COMPUTER ASSISTED TOTAL KNEE ARTHROPLASTY (Left: Knee)   Anesthesia type: Choice   Pre-op diagnosis: Primary osteoarthritis of left knee  M17.12   Location: [facility] OR ROOM 01 / [facility] ORS FOR ANESTHESIA GROUP   Surgeons: Dereck Leep, MD   NOTE: Available PAT nursing documentation and vital signs have been reviewed. Clinical nursing staff has updated patient's PMH/PSHx, current medication list, and drug allergies/intolerances to ensure comprehensive history available to assist in medical decision making as it pertains to the aforementioned surgical procedure and anticipated anesthetic course. Extensive review of available clinical information performed. [place] PMH and PSHx updated with any diagnoses/procedures that  may have been inadvertently omitted during his intake with the pre-admission testing department's nursing staff.  Clinical Discussion:  Leroy Merritt is a 76 y.o. male who is submitted for pre-surgical anesthesia review and clearance prior to him undergoing the above procedure. Patient has never been a smoker. Pertinent PMH includes: CAD, NICM, HFrEF, AV dissociation, ventricular tachycardia (s/p AICD placement), aortic root/ascending aorta dilatation, HLD, GERD (on daily PPI), BPH, OA, urethral stricture (required intraop urology consult Erlene Quan, MD) for foley catheter placement)  Patient is followed by cardiology Rockey Situ, MD). He was last seen in the cardiology clinic on 03/05/2021; notes reviewed. At the time of his clinic visit, patient denied any episodes of chest pain. He did report some dyspnea, which he attributed to difficulties with his hip. He denies any PND, orthopnea, palpitations, significant peripheral  edema, vertiginous symptoms, or presyncope/syncope. Patient with a PMH significant for cardiovascular diagnoses.  Patient experiencing long runs of sustained ventricular tachycardia with clear A-V dissociation.  Patient was transferred from cardiology office to [facility] where he underwent further testing and treatment.  Diagnostic left heart catheterization performed on 04/28/2017 revealing severe left ventricular systolic dysfunction with an EF of 25-30%.  There was global hypokinesis.  LVEDP normal at 40 mmHg.  Study did not demonstrate any evidence of obstructive CAD.  TTE performed on 04/29/2017 revealed moderately to severely reduced left ventricular systolic function with an EF of 30-35%.  There was diffuse/global hypokinesis.  Diastolic parameters consistent with abnormal left ventricular relaxation (G1DD). There is no evidence of significant valvular regurgitation or transvalvular gradient suggestive of stenosis.     Cardiac MRI performed on 04/29/2017 demonstrated severe left ventricular enlargement with diffuse hypokinesis; LVEF 27%.  Right ventricle moderately enlarged and hypokinetic.  There was biatrial enlargement.  Patient ultimately underwent placement of a Saint Jude dual-chamber AICD on 04/29/2017.  Last TTE was performed on 03/13/2020 revealing mildly decreased left ventricular systolic function; EF 91-47%.  There was global hypokinesis.  Diastolic Doppler parameters consistent with abnormal laxation (G1DD).  Average GLS abnormal at -12.7%.  Aortic dilatation noted; aortic root measured 3.9 cm and ascending aorta measured 3.7 cm.  Heart failure symptoms currently being managed on ARB/ANRi (valsartan/sacubitril), aldosterone antagonist (eplerenone), and beta blocker (sotalol).  Patient with AICD in place that is managed by electrophysiology Caryl Comes, MD); last interrogation revealed <1% tachyarrhythmia burden. Blood pressure well controlled at 114/82 on currently prescribed  diuretic, ARB, and beta-blocker therapies. Patient is not taking any type of lipid-lowering therapies for his HLD or ASCVD prevention.  He is not diabetic. Functional capacity, as defined by DASI, is documented as being >/=  4 METS.  No changes were made to his medication regimen.  Patient to follow-up with outpatient cardiology in 6 months or sooner if needed.  Leroy Merritt is scheduled for an elective LEFT COMPUTER ASSISTED TOTAL KNEE ARTHROPLASTY on 08/12/2021 with Dr. Skip Estimable, MD. Given patient's past medical history significant for cardiovascular diagnoses, presurgical cardiac clearance was sought by the PAT team. Per cardiology, "based ACC/AHA guidelines, the patient's past medical history, and the amount of time since his last clinic visit, this patient would be at an overall ACCEPTABLE risk for the planned procedure without further cardiovascular testing or intervention at this time". In review of his medication reconciliation, it is noted the patient is not currently taking any type of anticoagulation/antiplatelet medications that will need to be held during the perioperative period.  Patient denies previous perioperative complications with anesthesia in the past. In review of the available records, it is noted that patient underwent a general anesthetic course here at [facility] (ASA IV) in 03/2021 without documented complications.      07/31/2021   11:27 AM 06/07/2021   11:05 AM 05/08/2021    1:50 PM  Vitals with BMI  Height '5\' 10"'$  '5\' 10"'$  '5\' 10"'$   Weight 182 lbs 6 oz 180 lbs 180 lbs  BMI 26.17 57.84 69.62  Systolic 952 841 324  Diastolic 77 91 90  Pulse 65 68 75    Providers/Specialists:   NOTE: Primary physician provider listed below. Patient may have been seen by APP or partner within same practice.   PROVIDER ROLE / SPECIALTY LAST OV  Hooten, Laurice Record, MD Orthopedics 07/31/2021  Valerie Roys, DO Primary Care Provider 12/18/2020  Ida Rogue, MD Cardiology 09/12/2020  Virl Axe, MD Electrophysiology 03/05/2021   Allergies:  Patient has no known allergies.  Current Home Medications:   No current facility-administered medications for this encounter.    clobetasol ointment (TEMOVATE) 0.05 %   diphenhydramine-acetaminophen (TYLENOL PM) 25-500 MG TABS tablet   ENTRESTO 49-51 MG   eplerenone (INSPRA) 25 MG tablet   finasteride (PROSCAR) 5 MG tablet   Multiple Vitamins-Minerals (MULTIVITAMIN ADULT PO)   omeprazole (PRILOSEC) 20 MG capsule   sotalol (BETAPACE) 80 MG tablet   tamsulosin (FLOMAX) 0.4 MG CAPS capsule   History:   Past Medical History:  Diagnosis Date   AICD (automatic cardioverter/defibrillator) present    Arthritis    Ascending aorta dilation ([place]) 03/22/2019   a.) TTE 03/22/2019: Ao root measured 4m. b.) TTE 03/13/2020: Ao root 323m Asc. Ao 371m 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   Cataract    Dilated cardiomyopathy ([place]  Diverticulosis 2017   Dysphagia 12/20/2014   Esophagitis    First degree AV block    GERD (gastroesophageal reflux disease)    HFrEF (heart failure with reduced ejection fraction) ([place]2/20/2019   a.) TTE 04/29/2017: mod-sev LV ysfunction; EF 30-35%; global HK; G1DD. b.) Cardiac MRI 04/29/2017: biventricular/biatrial enlargement; LVEF 27%; global HK.   Hypercholesterolemia    ICD (implantable cardioverter-defibrillator) in place    a.) St. Judes device   NICM (nonischemic cardiomyopathy) ([facility]  Palpitations    Stricture and stenosis of esophagus    Urethral stricture 03/2021   a.) encountered intraoperatively in 03/2021 (hip surgery); required intraop urology consult (  Erlene Quan, MD) for foley catheter placement.   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 Jude dual-chamber ICD placement   Past Surgical History:  Procedure Laterality Date   CARDIAC CATHETERIZATION  04/28/2017   CATARACT EXTRACTION W/ INTRAOCULAR LENS IMPLANT Right 2017   CATARACT EXTRACTION W/PHACO Left 05/20/2019   Procedure: CATARACT EXTRACTION PHACO AND INTRAOCULAR LENS PLACEMENT ([place]) LEFT;  Surgeon: Birder Robson, MD;  Location: [facility] ORS;  Service: Ophthalmology;  Laterality: Left;  [place] 01:19.7 CDE 14.58 Fluid Pack Lot # [id number]   COLONOSCOPY     ESOPHAGOGASTRODUODENOSCOPY (EGD) WITH PROPOFOL N/A 02/19/2015   Procedure: ESOPHAGOGASTRODUODENOSCOPY (EGD) WITH PROPOFOL and esophageal dilation.;  Surgeon: Lucilla Lame, MD;  Location: [facility];  Service: Endoscopy;  Laterality: N/A;   EYE SURGERY     ICD IMPLANT N/A 04/29/2017   Procedure: ICD IMPLANT;  Surgeon: Evans Lance, MD;  Location: [place] CV [facility];  Service: Cardiovascular;  Laterality: N/A;   LEFT HEART CATH AND CORONARY ANGIOGRAPHY N/A 04/28/2017   Procedure: LEFT HEART CATH AND CORONARY ANGIOGRAPHY;  Surgeon: Martinique, Peter M, MD;  Location: [place] [facility];  Service: Cardiovascular;  Laterality: N/A;   PROSTATE BIOPSY  ~ 2012   RETINAL DETACHMENT SURGERY Right 1990s   "torn retina"   TOTAL HIP ARTHROPLASTY Right 03/20/2021   Procedure: TOTAL HIP ARTHROPLASTY;  Surgeon: Dereck Leep, MD;  Location: [facility] ORS;  Service: Orthopedics;  Laterality: Right;   Family History  Problem Relation Age of Onset   Arthritis Mother    Diabetes Father    Hypertension Father    Heart disease Brother    Hyperlipidemia Brother    Hypertension Brother    Lung cancer Daughter    Cancer Daughter    Social History   Tobacco Use   Smoking status: Never    Passive exposure: Never   Smokeless tobacco: Never  Vaping Use   Vaping Use: Never used  Substance Use Topics   Alcohol use: Yes    Alcohol/week: 1.0 standard drink    Types: 1 Glasses of wine per week     Comment: 1 Drink/Week   Drug use: No    Pertinent Clinical Results:  LABS: Labs reviewed: Acceptable for surgery.  Component Date Value Ref Range Status   ABO/RH(D) 07/31/2021 A NEG   Final   Antibody Screen 07/31/2021 NEG   Final   Sample Expiration 07/31/2021 08/14/2021, 2359   Final   Extend sample reason 07/31/2021    Final                   Value:NO TRANSFUSIONS OR PREGNANCY IN THE PAST 3 MONTHS Performed at [facility], [address] [place], [place] [address]    MRSA, PCR 07/31/2021 NEGATIVE  NEGATIVE Final   Staphylococcus aureus 07/31/2021 NEGATIVE  NEGATIVE Final   Comment: (NOTE) The Xpert SA Assay (FDA approved for NASAL specimens in patients 61 years of age and older), is one component of a comprehensive surveillance program. It is not intended to diagnose infection nor to guide or monitor treatment. Performed at [facility], [place], [place] [address]   WBC 07/31/2021 6.6  4.0 - 10.5 K/uL Final   RBC 07/31/2021 5.34  4.22 - 5.81 MIL/uL Final   Hemoglobin 07/31/2021 15.4  13.0 - 17.0 g/dL Final   HCT 07/31/2021 47.3  39.0 - 52.0 % Final   MCV 07/31/2021 88.6  80.0 - 100.0 fL Final   MCH 07/31/2021 28.8  26.0 - 34.0 pg Final   MCHC 07/31/2021 32.6  30.0 - 36.0 g/dL Final   RDW 07/31/2021 13.8  11.5 - 15.5 % Final   Platelets 07/31/2021 149 (L)  150 - 400 K/uL Final   nRBC 07/31/2021 0.0  0.0 - 0.2 % Final   Performed at [facility], [place], [place] [address]   Sodium 07/31/2021 140  135 - 145 mmol/L Final   Potassium 07/31/2021 4.1  3.5 - 5.1 mmol/L Final   Chloride 07/31/2021 107  98 - 111 mmol/L Final   CO2 07/31/2021 25  22 - 32 mmol/L Final   Glucose, Bld 07/31/2021 88  70 - 99 mg/dL Final   Glucose reference range applies only to samples taken after fasting for at least 8 hours.   BUN 07/31/2021 19  8 - 23 mg/dL Final   Creatinine, Ser 07/31/2021 1.01  0.61 - 1.24 mg/dL Final   Calcium  07/31/2021 9.0  8.9 - 10.3 mg/dL Final   Total Protein 07/31/2021 6.9  6.5 - 8.1 g/dL Final   Albumin 07/31/2021 3.9  3.5 - 5.0 g/dL Final   AST 07/31/2021 15  15 - 41 U/L Final   ALT 07/31/2021 12  0 - 44 U/L Final   Alkaline Phosphatase 07/31/2021 74  38 - 126 U/L Final   Total Bilirubin 07/31/2021 1.1  0.3 - 1.2 mg/dL Final   GFR, Estimated 07/31/2021 >60  >60 mL/min Final   Comment: (NOTE) Calculated using the CKD-EPI Creatinine Equation (2021)    Anion gap 07/31/2021 8  5 - 15 Final   Performed at [facility], [place], [place] [address]   Color, Urine 07/31/2021 YELLOW (A)  YELLOW Final   APPearance 07/31/2021 CLEAR (A)  CLEAR Final   Specific Gravity, Urine 07/31/2021 1.021  1.005 - 1.030 Final   pH 07/31/2021 5.0  5.0 - 8.0 Final   Glucose, UA 07/31/2021 NEGATIVE  NEGATIVE mg/dL Final   Hgb urine dipstick 07/31/2021 NEGATIVE  NEGATIVE Final   Bilirubin Urine 07/31/2021 NEGATIVE  NEGATIVE Final   Ketones, ur 07/31/2021 NEGATIVE  NEGATIVE mg/dL Final   Protein, ur 07/31/2021 NEGATIVE  NEGATIVE mg/dL Final   Nitrite 07/31/2021 NEGATIVE  NEGATIVE Final   Leukocytes,Ua 07/31/2021 NEGATIVE  NEGATIVE Final   Performed at [facility], [address] [place], [place] [address]   CRP 07/31/2021 0.6  <1.0 mg/dL Final   Performed at [facility] [address] [place], [place] [address]   Sed Rate 07/31/2021 2  0 - 20 mm/hr Final   Performed at [facility], [address] [place], [place] [address]    ECG: Date: 03/05/2021 Time ECG obtained: 0914 AM Rate: 77 bpm Rhythm:  Sinus rhythm with first-degree AV block Axis (leads I and aVF): Normal Intervals: PR 282 ms. QRS 82 ms. QTc 423 ms. ST segment and T wave changes: No evidence of acute ST segment elevation or depression. Evidence of an age undetermined interior infarct present. Comparison: Similar to tracing obtained on 01/29/2021   IMAGING / PROCEDURES: TRANSTHORACIC  ECHOCARDIOGRAM performed on 03/13/2020 Left ventricular ejection fraction, by estimation, is 45 to 50%. The left ventricle has mildly decreased function. The left ventricle  demonstrates global hypokinesis. There is mild left ventricular hypertrophy. Left ventricular diastolic parameters are consistent with Grade I diastolic dysfunction (impaired relaxation). The average left ventricular global longitudinal strain is -12.7 %.  The global longitudinal strain is abnormal.  Right ventricular systolic function is normal. The right ventricular size is normal. There is normal pulmonary artery systolic pressure.  The mitral valve is normal in structure. Mild mitral valve regurgitation. No evidence of mitral stenosis.  The aortic valve has an indeterminant number of cusps. There is mild thickening of the aortic valve. Aortic valve regurgitation is not visualized. Mild aortic valve sclerosis is present, with no evidence of  aortic valve stenosis.  Aortic dilatation noted. 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   MRI CARDIAC MORPHOLOGY W WO CONTRAST performed on 04/29/2017 Severe LVE with diffuse hypokinesis; LVEF 27% Minimal delayed gadolinium uptake in the basal inferior wall and septum Moderate right ventricular enlargement and hypokinesis with no evidence of right ventricular dysplasia Biatrial enlargement  LEFT HEART CATHETERIZATION AND CORONARY ANGIOGRAPHY performed on 04/28/2017 Nonobstructive CAD severe left ventricular systolic dysfunction Global hypokinesis with an EF of 25-30% Normal LVEDP of 14 mmHg  Impression and Plan:  Leroy Merritt has been referred for pre-anesthesia review and clearance prior to him undergoing the planned anesthetic and procedural courses. Available labs, pertinent testing, and imaging results were personally reviewed by me. This patient has been appropriately cleared by cardiology  with an overall ACCEPTABLE risk of significant perioperative cardiovascular complications. Completed perioperative prescription for cardiac device management documentation completed by primary cardiology team and placed on patient's chart for review by the surgical/anesthetic team on the day of his procedure.   Based on clinical review performed today (08/06/21), barring any significant acute changes in the patient's overall condition, it is anticipated that he will be able to proceed with the planned surgical intervention. Any acute changes in clinical condition may necessitate his procedure being postponed and/or cancelled. Patient will meet with anesthesia team (MD and/or CRNA) on the day of his procedure for preoperative evaluation/assessment. Questions regarding anesthetic course will be fielded at that time.   Pre-surgical instructions were reviewed with the patient during his PAT appointment and questions were fielded by PAT clinical staff. Patient was advised that if any questions or concerns arise prior to his procedure then he should return a call to PAT and/or his surgeon's office to discuss.  Honor Loh, MSN, APRN, FNP-C, CEN [facility]  Peri-operative Services Nurse Practitioner Phone: [phone] Fax: [phone] 08/06/21 11:26 AM  NOTE: This note has been prepared using Dragon dictation software. Despite my best ability to proofread, there is always the potential that unintentional transcriptional errors may still occur from this process.

## 2021-08-07 ENCOUNTER — Encounter: Payer: Self-pay | Admitting: Orthopedic Surgery

## 2021-08-07 ENCOUNTER — Ambulatory Visit (INDEPENDENT_AMBULATORY_CARE_PROVIDER_SITE_OTHER): Payer: Medicare HMO | Admitting: Nurse Practitioner

## 2021-08-07 DIAGNOSIS — Z01810 Encounter for preprocedural cardiovascular examination: Secondary | ICD-10-CM | POA: Diagnosis not present

## 2021-08-07 NOTE — Progress Notes (Signed)
Virtual Visit via Telephone Note   Because of Leroy Merritt's co-morbid illnesses, he is at least at moderate risk for complications without adequate follow up.  This format is felt to be most appropriate for this patient at this time.  The patient did not have access to video technology/had technical difficulties with video requiring transitioning to audio format only (telephone).  All issues noted in this document were discussed and addressed.  No physical exam could be performed with this format.  Please refer to the patient's chart for his consent to telehealth for Leroy Merritt.  Evaluation Performed:  Preoperative cardiovascular risk assessment _____________   Date:  08/07/2021   Patient ID:  Leroy Merritt, Leroy Merritt [birth year], MRN [id number] Patient Location:  Home Provider location:   Office  Primary Care Provider:  Valerie Roys, DO Primary Cardiologist:  Ida Rogue, MD  Chief Complaint / Patient Profile   76 y.o. y/o male with a h/o NICM s/p ICD 04/2017, HLD, HFrEF, GERD, CAD, aortic root dilation, BPH who is pending computer-assisted total knee arthroplasty and presents today for telephonic preoperative cardiovascular risk assessment.  Past Medical History    Past Medical History:  Diagnosis Date   AICD (automatic cardioverter/defibrillator) present    Arthritis    Ascending aorta dilation ([place]) 03/22/2019   a.) TTE 03/22/2019: Ao root measured 81m. b.) TTE 03/13/2020: Ao root 369m Asc. Ao 3768m 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   Cataract    Dilated cardiomyopathy ([place]  Diverticulosis 2017   Dysphagia 12/20/2014   Esophagitis    First degree AV block    GERD (gastroesophageal reflux disease)    HFrEF (heart failure with reduced ejection fraction) ([place]2/20/2019   a.) TTE 04/29/2017:  mod-sev LV dysfunction; EF 30-35%; global HK; G1DD. b.) Cardiac MRI 04/29/2017: biventricular/biatrial enlargement; LVEF 27%; global HK.   Hypercholesterolemia    ICD (implantable cardioverter-defibrillator) in place    a.) St. Judes device   NICM (nonischemic cardiomyopathy) ([place]  a.) TTE 04/29/2017: EF 30-35%. b.) Cardiac MRI 04/29/2017: EF 27%.   Palpitations    Stricture and stenosis of esophagus    Urethral stricture 03/2021   a.) encountered intraoperatively in 03/2021 (hip surgery); required intraop urology consult (BrErlene QuanD) for foley catheter placement.   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 Jude dual-chamber ICD placement   Past Surgical History:  Procedure Laterality Date   CARDIAC CATHETERIZATION  04/28/2017   CATARACT EXTRACTION W/ INTRAOCULAR LENS IMPLANT Right 2017   CATARACT EXTRACTION W/PHACO Left 05/20/2019   Procedure: CATARACT EXTRACTION PHACO AND INTRAOCULAR LENS PLACEMENT ([place];  Surgeon: PorBirder RobsonD;  Location: [facility];  Service: Ophthalmology;  Laterality: Left;  [place]:19.7 CDE 14.58 Fluid Pack Lot # [id number]  COLONOSCOPY     ESOPHAGOGASTRODUODENOSCOPY (EGD) WITH PROPOFOL N/A 02/19/2015   Procedure: ESOPHAGOGASTRODUODENOSCOPY (EGD) WITH PROPOFOL and esophageal dilation.;  Surgeon: DarLucilla LameD;  [place];  Laterality: N/A;   EYE SURGERY     ICD IMPLANT N/A 04/29/2017   Procedure: ICD IMPLANT;  Surgeon: TayEvans LanceD;  Location: [facility] [place] [facility];  Service: Cardiovascular;  Laterality: N/A;   LEFT HEART CATH AND CORONARY ANGIOGRAPHY  N/A 04/28/2017   Procedure: LEFT HEART CATH AND CORONARY ANGIOGRAPHY;  Surgeon: Martinique, Peter M, MD;  Location: [place] [facility];  Service: Cardiovascular;  Laterality: N/A;   PROSTATE BIOPSY  ~ 2012   RETINAL DETACHMENT SURGERY Right 1990s   "torn retina"   TOTAL HIP ARTHROPLASTY Right  03/20/2021   Procedure: TOTAL HIP ARTHROPLASTY;  Surgeon: Dereck Leep, MD;  Location: [facility];  Service: Orthopedics;  Laterality: Right;    Allergies  No Known Allergies  History of Present Illness    Leroy Merritt is a 76 y.o. male who presents via audio/video conferencing for a telehealth visit today.  Pt was last seen in cardiology clinic on 03/05/2021 by Dr. Caryl Comes.  At that time Leroy Merritt was doing well his GDMT was being titrated and patient was tolerating Entresto.  He was euvolemic on examination with no anginal complaints.  The patient is now pending procedure as outlined above. Since his last visit, he states that he is doing well and his volume status has remained euvolemic.  He has no complaints of shortness of breath orthopnea and is compliant with all of his medications.  Home Medications    Prior to Admission medications   Medication Sig Start Date End Date Taking? Authorizing Provider  clobetasol ointment (TEMOVATE) 1.85 % Apply 1 application topically 2 (two) times daily. Patient taking differently: Apply 1 application. topically daily. Apply to penis 05/08/21   Hollice Espy, MD  diphenhydramine-acetaminophen (TYLENOL PM) 25-500 MG TABS tablet Take 1 tablet by mouth at bedtime as needed (sleep).    [provider]  ENTRESTO 49-51 MG TAKE ONE (1) TABLET BY MOUTH TWO TIMES PER DAY 04/09/21   [name], MD  eplerenone (INSPRA) 25 MG tablet TAKE ONE TABLET BY MOUTH DAILY Patient taking differently: 25 mg every morning. 07/10/21   Deboraha Sprang, MD  finasteride (PROSCAR) 5 MG tablet TAKE 1 TABLET AT BEDTIME 07/10/21   Johnson, Megan P, DO  Multiple Vitamins-Minerals (MULTIVITAMIN ADULT PO) Take 1 tablet by mouth every morning.  Patient not taking: Reported on 07/31/2021 01/11/15   [provider]  omeprazole (PRILOSEC) 20 MG capsule Take 1 capsule (20 mg total) by mouth daily. Patient taking differently: Take 20 mg by mouth every morning.  12/18/20   Johnson, Megan P, DO  sotalol (BETAPACE) 80 MG tablet Take 1 tablet (80 mg total) by mouth 2 (two) times daily. 07/23/21   [name], MD  tamsulosin (FLOMAX) 0.4 MG CAPS capsule TAKE 1 CAPSULE EVERY DAY Patient taking differently: 0.4 mg daily after supper. 07/10/21   Valerie Roys, DO    Physical Exam    Vital Signs:  Leroy Merritt does not have vital signs available for review today.  Blood pressure was 110/72 with heart rate of 68  Given telephonic nature of communication, physical exam is limited. AAOx3. NAD. Normal affect.  Speech and respirations are unlabored.  Accessory Clinical Findings    None  Assessment & Plan    1.  Preoperative Cardiovascular Risk Assessment:  Leroy Merritt perioperative risk of a major cardiac event is 6.6% according to the Revised Cardiac Risk Index (RCRI).  Therefore, he is at high risk for perioperative complications.   His functional capacity is good at 5.07 METs according to the Duke Activity Status Index (DASI).  Although patient is at increased risk for complication his functional status and current state of health is optimal to proceed with procedure at this time.  The patient affirms he has been doing well without any new cardiac symptoms. They are able to achieve 5 METS without cardiac limitations. Therefore, based on ACC/AHA guidelines, the patient would be at acceptable risk for the planned procedure without further cardiovascular testing. The patient was advised that if he develops new symptoms prior to surgery to contact our office to arrange for a follow-up visit, and he verbalized understanding.   Recommendations: According to ACC/AHA guidelines, no further cardiovascular testing needed.  The patient may proceed to surgery at acceptable risk.   Antiplatelet and/or Anticoagulation Recommendations: None requested    A copy of this note will be routed to requesting surgeon.  Time:   Today, I have spent 6 minutes with the  patient with telehealth technology discussing medical history, symptoms, and management plan.     Leroy Merritt, Marissa Nestle, NP  08/07/2021, 7:11 AM

## 2021-08-11 NOTE — H&P (Signed)
ORTHOPAEDIC HISTORY & PHYSICAL Leroy Bellow, PA - 07/31/2021 9:15 AM EDT Formatting of this note is different from the original. Images from the original note were not included. Chief Complaint Chief Complaint  Patient presents with   H&P- LT TKA- 08/12/21/ Leroy Merritt   Reason for Visit Leroy Merritt is a 76 y.o. who presents today for history and physical. He is to undergo a left total knee arthroplasty on 08/12/2021. Last seen in clinic on 06/18/2021. There is been no change in his condition. Doing very well in regards to the right total hip arthroplasty. However the left knee pain has increased to the point that is significant interfering with his activities of daily living and wishes to proceed with surgery.  He reports a long history of left knee pain that has increased in severity over the last several months. He localizes most of the pain along the medial aspect of the knee. He reports some swelling, no locking, and some giving way of the knee. The pain is aggravated by any weight bearing. The knee pain limits the patient's ability to ambulate long distances. The patient has not appreciated any significant improvement despite activity modification and NSAIDs. He is not using any ambulatory aids.     Past Medical History Past Medical History:  Diagnosis Date   BPH (benign prostatic hyperplasia)   Dilated cardiomyopathy (CMS-HCC)   Diverticulosis   Dysrhythmia   Heart disease   History of chicken pox   Past Surgical History Past Surgical History:  Procedure Laterality Date   Right total hip arthroplasty 03/20/2021  Dr Marry Guan   Urethral dilation with Foley catheter placement 03/20/2021  Dr Camillia Herter, (intraoperative urology -urethral stricture)   INSERTION DUAL CHAMBER PACEMAKER GENERATOR   UPPER GASTROINTESTINAL ENDOSCOPY   Past Family History Family History  Problem Relation Age of Onset   Arthritis Mother   High blood pressure (Hypertension) Father   Diabetes type II Father    Heart disease Brother   Medications Current Outpatient Medications Ordered in Epic  Medication Sig Dispense Refill   eplerenone (INSPRA) 25 MG tablet Take 25 mg by mouth once daily   finasteride (PROSCAR) 5 mg tablet Take 5 mg by mouth once daily.   multivitamin tablet Take 1 tablet by mouth once daily.   omeprazole (PRILOSEC) 20 MG DR capsule Take 20 mg by mouth once daily   polyethylene glycol (MIRALAX) powder Take 17 g by mouth once daily Mix in 4-8ounces of fluid prior to taking.   sacubitriL-valsartan (ENTRESTO) 24-26 mg tablet Entresto 24 mg-26 mg tablet TK 1 T PO BID   sotaloL (BETAPACE) 80 MG tablet Take 80 mg by mouth every 12 (twelve) hours   tamsulosin (FLOMAX) 0.4 mg capsule Take 0.4 mg by mouth once daily. Take 30 minutes after same meal each day.   No current Epic-ordered facility-administered medications on file.   Allergies No Known Allergies   Review of Systems A comprehensive 14 point ROS was performed, reviewed, and the pertinent orthopaedic findings are documented in the HPI.  Exam BP 110/72 (BP Location: Left upper arm, Patient Position: Sitting, BP Cuff Size: Large Adult)  Ht 177.8 cm ('5\' 10"'$ )  Wt 82.6 kg (182 lb)  BMI 26.11 kg/m   General: Well-developed well-nourished male seen in no acute distress.   HEENT: Atraumatic,normocephalic. Pupils are equal and reactive to light. Oropharynx is clear with moist mucosa  Lungs: Clear to auscultation bilaterally   Cardiovascular: Regular rate and rhythm. Normal S1, S2. No murmurs. No appreciable  gallops or rubs. Peripheral pulses are palpable.  Abdomen: Soft, non-tender, nondistended. Bowel sounds present  Extremity: Left Knee: Soft tissue swelling: mild Effusion: mild Erythema: none Crepitance: mild Tenderness: medial Alignment: relative varus Mediolateral laxity: medial pseudolaxity Posterior sag: negative Patellar tracking: Good tracking without evidence of subluxation or tilt Atrophy: No  significant atrophy.  Quadriceps tone was good. Range of motion: 0/3/126 degrees    Neurological:  The patient is alert and oriented Sensation to light touch appears to be intact and within normal limits Gross motor strength appeared to be equal to 5/5  Vascular :  Peripheral pulses felt to be palpable. Capillary refill appears to be intact and within normal limits  X-ray  1. X-rays of the left knee taken on 06/18/2021 showed significant narrowing of the medial cartilage space with bone-on-bone articulation and associated varus alignment. Osteophyte formation is noted. Subchondral sclerosis is noted. No evidence of fracture or dislocation.   Impression  1. Degenerative arthrosis left knee  Plan   1. I did go over the patient's location just on today's visit. He is to discontinue antianti-inflammatory medications 1 week prior to surgery 2. Did discuss the procedure as well as postop course 3. Return to clinic 2 weeks postop. Sooner if any problems  This note was generated in part with voice recognition software and I apologize for any typographical errors that were not detected and corrected   Watt Climes PA Electronically signed by Leroy Bellow, PA at 07/31/2021 10:17 AM EDT

## 2021-08-12 ENCOUNTER — Other Ambulatory Visit: Payer: Self-pay

## 2021-08-12 ENCOUNTER — Encounter
Admission: RE | Disposition: A | Discharge status: Home Health | Payer: Self-pay | Source: Home / Self Care | Attending: Orthopedic Surgery

## 2021-08-12 ENCOUNTER — Ambulatory Visit: Payer: Medicare HMO | Admitting: Urgent Care

## 2021-08-12 ENCOUNTER — Encounter: Payer: Self-pay | Admitting: Orthopedic Surgery

## 2021-08-12 ENCOUNTER — Observation Stay
Admission: RE | Admit: 2021-08-12 | Discharge: 2021-08-13 | Disposition: A | Discharge status: Home Health | Payer: Medicare HMO | Attending: Orthopedic Surgery | Admitting: Orthopedic Surgery

## 2021-08-12 ENCOUNTER — Observation Stay: Payer: Medicare HMO

## 2021-08-12 DIAGNOSIS — Z96641 Presence of right artificial hip joint: Secondary | ICD-10-CM | POA: Insufficient documentation

## 2021-08-12 DIAGNOSIS — I420 Dilated cardiomyopathy: Secondary | ICD-10-CM

## 2021-08-12 DIAGNOSIS — K219 Gastro-esophageal reflux disease without esophagitis: Secondary | ICD-10-CM | POA: Diagnosis not present

## 2021-08-12 DIAGNOSIS — I2510 Atherosclerotic heart disease of native coronary artery without angina pectoris: Secondary | ICD-10-CM | POA: Insufficient documentation

## 2021-08-12 DIAGNOSIS — Z95810 Presence of automatic (implantable) cardiac defibrillator: Secondary | ICD-10-CM | POA: Diagnosis not present

## 2021-08-12 DIAGNOSIS — Z79899 Other long term (current) drug therapy: Secondary | ICD-10-CM | POA: Insufficient documentation

## 2021-08-12 DIAGNOSIS — M1712 Unilateral primary osteoarthritis, left knee: Principal | ICD-10-CM | POA: Insufficient documentation

## 2021-08-12 DIAGNOSIS — Z96659 Presence of unspecified artificial knee joint: Secondary | ICD-10-CM

## 2021-08-12 DIAGNOSIS — I5022 Chronic systolic (congestive) heart failure: Secondary | ICD-10-CM | POA: Diagnosis not present

## 2021-08-12 DIAGNOSIS — I440 Atrioventricular block, first degree: Secondary | ICD-10-CM

## 2021-08-12 DIAGNOSIS — Z96652 Presence of left artificial knee joint: Secondary | ICD-10-CM | POA: Diagnosis not present

## 2021-08-12 HISTORY — PX: KNEE ARTHROPLASTY: SHX992

## 2021-08-12 SURGERY — ARTHROPLASTY, KNEE, TOTAL, USING IMAGELESS COMPUTER-ASSISTED NAVIGATION
Anesthesia: Spinal | Site: Knee | Laterality: Left

## 2021-08-12 MED ORDER — EPHEDRINE SULFATE (PRESSORS) 50 MG/ML IJ SOLN
INTRAMUSCULAR | Status: AC
  Filled 2021-08-12: qty 1

## 2021-08-12 MED ORDER — PHENYLEPHRINE HCL-NACL 20-0.9 MG/250ML-% IV SOLN
INTRAVENOUS | Status: DC | PRN
  Administered 2021-08-12: 40 ug/min via INTRAVENOUS

## 2021-08-12 MED ORDER — CHLORHEXIDINE GLUCONATE 4 % EX LIQD
60.0000 mL | Freq: Once | CUTANEOUS | Status: AC
  Administered 2021-08-12: 4 via TOPICAL

## 2021-08-12 MED ORDER — OXYCODONE HCL 5 MG PO TABS
ORAL_TABLET | ORAL | Status: AC
  Filled 2021-08-12: qty 2

## 2021-08-12 MED ORDER — FAMOTIDINE 20 MG PO TABS
ORAL_TABLET | ORAL | Status: AC
  Filled 2021-08-12: qty 1

## 2021-08-12 MED ORDER — SODIUM CHLORIDE 0.9 % IV SOLN: INTRAVENOUS | Status: DC

## 2021-08-12 MED ORDER — SOTALOL HCL 80 MG PO TABS
80.0000 mg | ORAL_TABLET | Freq: Two times a day (BID) | ORAL | Status: DC
  Administered 2021-08-12 – 2021-08-13 (×2): 80 mg via ORAL
  Filled 2021-08-12 (×2): qty 1

## 2021-08-12 MED ORDER — METOCLOPRAMIDE HCL 10 MG PO TABS
10.0000 mg | ORAL_TABLET | Freq: Three times a day (TID) | ORAL | Status: DC
  Administered 2021-08-12 – 2021-08-13 (×2): 10 mg via ORAL
  Filled 2021-08-12 (×6): qty 1

## 2021-08-12 MED ORDER — PHENOL 1.4 % MT LIQD: 1.0000 | OROMUCOSAL | Status: DC | PRN

## 2021-08-12 MED ORDER — CLOBETASOL PROPIONATE 0.05 % EX OINT
1.0000 "application " | TOPICAL_OINTMENT | Freq: Every day | CUTANEOUS | Status: DC
  Filled 2021-08-12: qty 15

## 2021-08-12 MED ORDER — TRANEXAMIC ACID-NACL 1000-0.7 MG/100ML-% IV SOLN
INTRAVENOUS | Status: AC
  Administered 2021-08-12: 1000 mg via INTRAVENOUS
  Filled 2021-08-12: qty 100

## 2021-08-12 MED ORDER — TRANEXAMIC ACID-NACL 1000-0.7 MG/100ML-% IV SOLN: 1000.0000 mg | Freq: Once | INTRAVENOUS | Status: AC

## 2021-08-12 MED ORDER — 0.9 % SODIUM CHLORIDE (POUR BTL) OPTIME
TOPICAL | Status: DC | PRN
  Administered 2021-08-12: 500 mL

## 2021-08-12 MED ORDER — ENOXAPARIN SODIUM 30 MG/0.3ML IJ SOSY
30.0000 mg | PREFILLED_SYRINGE | Freq: Two times a day (BID) | INTRAMUSCULAR | Status: DC
  Administered 2021-08-13: 30 mg via SUBCUTANEOUS
  Filled 2021-08-12: qty 0.3

## 2021-08-12 MED ORDER — LACTATED RINGERS IV SOLN: INTRAVENOUS | Status: DC

## 2021-08-12 MED ORDER — TRANEXAMIC ACID-NACL 1000-0.7 MG/100ML-% IV SOLN
1000.0000 mg | INTRAVENOUS | Status: AC
  Administered 2021-08-12: 1000 mg via INTRAVENOUS

## 2021-08-12 MED ORDER — TAMSULOSIN HCL 0.4 MG PO CAPS
0.4000 mg | ORAL_CAPSULE | Freq: Every day | ORAL | Status: DC
  Administered 2021-08-12: 0.4 mg via ORAL
  Filled 2021-08-12: qty 1

## 2021-08-12 MED ORDER — TRAMADOL HCL 50 MG PO TABS
50.0000 mg | ORAL_TABLET | ORAL | Status: DC | PRN
  Administered 2021-08-13 (×2): 50 mg via ORAL
  Filled 2021-08-12 (×2): qty 1

## 2021-08-12 MED ORDER — MENTHOL 3 MG MT LOZG: 1.0000 | LOZENGE | OROMUCOSAL | Status: DC | PRN

## 2021-08-12 MED ORDER — CEFAZOLIN SODIUM-DEXTROSE 2-4 GM/100ML-% IV SOLN
2.0000 g | Freq: Three times a day (TID) | INTRAVENOUS | Status: AC
  Administered 2021-08-12 – 2021-08-13 (×2): 2 g via INTRAVENOUS
  Filled 2021-08-12 (×2): qty 100

## 2021-08-12 MED ORDER — DIPHENHYDRAMINE HCL 12.5 MG/5ML PO ELIX: 12.5000 mg | ORAL_SOLUTION | ORAL | Status: DC | PRN

## 2021-08-12 MED ORDER — FENTANYL CITRATE (PF) 100 MCG/2ML IJ SOLN: 25.0000 ug | INTRAMUSCULAR | Status: DC | PRN

## 2021-08-12 MED ORDER — CHLORHEXIDINE GLUCONATE 0.12 % MT SOLN
OROMUCOSAL | Status: AC
  Filled 2021-08-12: qty 15

## 2021-08-12 MED ORDER — CEFAZOLIN SODIUM-DEXTROSE 2-4 GM/100ML-% IV SOLN
2.0000 g | INTRAVENOUS | Status: AC
  Administered 2021-08-12: 2 g via INTRAVENOUS

## 2021-08-12 MED ORDER — FERROUS SULFATE 325 (65 FE) MG PO TABS
325.0000 mg | ORAL_TABLET | Freq: Two times a day (BID) | ORAL | Status: DC
  Administered 2021-08-13 (×2): 325 mg via ORAL
  Filled 2021-08-12 (×2): qty 1

## 2021-08-12 MED ORDER — CELECOXIB 200 MG PO CAPS
ORAL_CAPSULE | ORAL | Status: AC
  Filled 2021-08-12: qty 2

## 2021-08-12 MED ORDER — SACUBITRIL-VALSARTAN 49-51 MG PO TABS
1.0000 | ORAL_TABLET | Freq: Two times a day (BID) | ORAL | Status: DC
  Administered 2021-08-12 – 2021-08-13 (×2): 1 via ORAL
  Filled 2021-08-12 (×2): qty 1

## 2021-08-12 MED ORDER — FLEET ENEMA 7-19 GM/118ML RE ENEM: 1.0000 | ENEMA | Freq: Once | RECTAL | Status: DC | PRN

## 2021-08-12 MED ORDER — FINASTERIDE 5 MG PO TABS
5.0000 mg | ORAL_TABLET | Freq: Every day | ORAL | Status: DC
  Administered 2021-08-12: 5 mg via ORAL
  Filled 2021-08-12: qty 1

## 2021-08-12 MED ORDER — ONDANSETRON HCL 4 MG PO TABS: 4.0000 mg | ORAL_TABLET | Freq: Four times a day (QID) | ORAL | Status: DC | PRN

## 2021-08-12 MED ORDER — SODIUM CHLORIDE 0.9 % IR SOLN
Status: DC | PRN
  Administered 2021-08-12: 3000 mL

## 2021-08-12 MED ORDER — ORAL CARE MOUTH RINSE: 15.0000 mL | Freq: Once | OROMUCOSAL | Status: AC

## 2021-08-12 MED ORDER — ALUM & MAG HYDROXIDE-SIMETH 200-200-20 MG/5ML PO SUSP: 30.0000 mL | ORAL | Status: DC | PRN

## 2021-08-12 MED ORDER — NEOMYCIN-POLYMYXIN B GU 40-200000 IR SOLN
Status: DC | PRN
  Administered 2021-08-12: 14 mL

## 2021-08-12 MED ORDER — OXYCODONE HCL 5 MG/5ML PO SOLN: 5.0000 mg | Freq: Once | ORAL | Status: DC | PRN

## 2021-08-12 MED ORDER — PANTOPRAZOLE SODIUM 40 MG PO TBEC
40.0000 mg | DELAYED_RELEASE_TABLET | Freq: Two times a day (BID) | ORAL | Status: DC
  Administered 2021-08-12 – 2021-08-13 (×2): 40 mg via ORAL
  Filled 2021-08-12 (×2): qty 1

## 2021-08-12 MED ORDER — ACETAMINOPHEN 10 MG/ML IV SOLN
INTRAVENOUS | Status: AC
  Filled 2021-08-12: qty 100

## 2021-08-12 MED ORDER — SENNOSIDES-DOCUSATE SODIUM 8.6-50 MG PO TABS
1.0000 | ORAL_TABLET | Freq: Two times a day (BID) | ORAL | Status: DC
  Administered 2021-08-12 – 2021-08-13 (×2): 1 via ORAL
  Filled 2021-08-12 (×2): qty 1

## 2021-08-12 MED ORDER — HYDROMORPHONE HCL 1 MG/ML IJ SOLN: 0.5000 mg | INTRAMUSCULAR | Status: DC | PRN

## 2021-08-12 MED ORDER — CELECOXIB 200 MG PO CAPS
200.0000 mg | ORAL_CAPSULE | Freq: Two times a day (BID) | ORAL | Status: DC
  Administered 2021-08-12 – 2021-08-13 (×2): 200 mg via ORAL
  Filled 2021-08-12 (×2): qty 1

## 2021-08-12 MED ORDER — ACETAMINOPHEN 10 MG/ML IV SOLN
INTRAVENOUS | Status: DC | PRN
  Administered 2021-08-12: 1000 mg via INTRAVENOUS

## 2021-08-12 MED ORDER — GABAPENTIN 300 MG PO CAPS
ORAL_CAPSULE | ORAL | Status: AC
  Filled 2021-08-12: qty 1

## 2021-08-12 MED ORDER — SODIUM CHLORIDE 0.9 % IV SOLN
INTRAVENOUS | Status: DC | PRN
  Administered 2021-08-12: 60 mL

## 2021-08-12 MED ORDER — OXYCODONE HCL 5 MG PO TABS: 5.0000 mg | ORAL_TABLET | ORAL | Status: DC | PRN

## 2021-08-12 MED ORDER — DEXAMETHASONE SODIUM PHOSPHATE 10 MG/ML IJ SOLN
8.0000 mg | Freq: Once | INTRAMUSCULAR | Status: AC
  Administered 2021-08-12: 8 mg via INTRAVENOUS

## 2021-08-12 MED ORDER — BISACODYL 10 MG RE SUPP: 10.0000 mg | Freq: Every day | RECTAL | Status: DC | PRN

## 2021-08-12 MED ORDER — FENTANYL CITRATE (PF) 100 MCG/2ML IJ SOLN
INTRAMUSCULAR | Status: DC | PRN
  Administered 2021-08-12: 50 ug via INTRAVENOUS

## 2021-08-12 MED ORDER — CEFAZOLIN SODIUM-DEXTROSE 2-4 GM/100ML-% IV SOLN
INTRAVENOUS | Status: AC
  Filled 2021-08-12: qty 100

## 2021-08-12 MED ORDER — DIPHENHYDRAMINE-APAP (SLEEP) 25-500 MG PO TABS: 1.0000 | ORAL_TABLET | Freq: Every evening | ORAL | Status: DC | PRN

## 2021-08-12 MED ORDER — FENTANYL CITRATE (PF) 100 MCG/2ML IJ SOLN
INTRAMUSCULAR | Status: AC
  Filled 2021-08-12: qty 2

## 2021-08-12 MED ORDER — PROPOFOL 500 MG/50ML IV EMUL
INTRAVENOUS | Status: DC | PRN
  Administered 2021-08-12: 200 ug/kg/min via INTRAVENOUS

## 2021-08-12 MED ORDER — FAMOTIDINE 20 MG PO TABS
20.0000 mg | ORAL_TABLET | Freq: Once | ORAL | Status: AC
  Administered 2021-08-12: 20 mg via ORAL

## 2021-08-12 MED ORDER — ONDANSETRON HCL 4 MG/2ML IJ SOLN: 4.0000 mg | Freq: Four times a day (QID) | INTRAMUSCULAR | Status: DC | PRN

## 2021-08-12 MED ORDER — OXYCODONE HCL 5 MG PO TABS
10.0000 mg | ORAL_TABLET | ORAL | Status: DC | PRN
  Administered 2021-08-12: 10 mg via ORAL

## 2021-08-12 MED ORDER — ACETAMINOPHEN 10 MG/ML IV SOLN
1000.0000 mg | Freq: Four times a day (QID) | INTRAVENOUS | Status: AC
  Administered 2021-08-12 – 2021-08-13 (×4): 1000 mg via INTRAVENOUS
  Filled 2021-08-12 (×4): qty 100

## 2021-08-12 MED ORDER — SURGIPHOR WOUND IRRIGATION SYSTEM - OPTIME
TOPICAL | Status: DC | PRN
  Administered 2021-08-12: 450 mL via TOPICAL

## 2021-08-12 MED ORDER — TRANEXAMIC ACID-NACL 1000-0.7 MG/100ML-% IV SOLN
INTRAVENOUS | Status: AC
  Filled 2021-08-12: qty 100

## 2021-08-12 MED ORDER — EPHEDRINE SULFATE (PRESSORS) 50 MG/ML IJ SOLN
INTRAMUSCULAR | Status: DC | PRN
  Administered 2021-08-12: 10 mg via INTRAVENOUS

## 2021-08-12 MED ORDER — CELECOXIB 200 MG PO CAPS
400.0000 mg | ORAL_CAPSULE | Freq: Once | ORAL | Status: AC
  Administered 2021-08-12: 400 mg via ORAL

## 2021-08-12 MED ORDER — GABAPENTIN 300 MG PO CAPS
300.0000 mg | ORAL_CAPSULE | Freq: Once | ORAL | Status: AC
  Administered 2021-08-12: 300 mg via ORAL

## 2021-08-12 MED ORDER — SPIRONOLACTONE 25 MG PO TABS
25.0000 mg | ORAL_TABLET | Freq: Every day | ORAL | Status: DC
  Administered 2021-08-12 – 2021-08-13 (×2): 25 mg via ORAL
  Filled 2021-08-12 (×2): qty 1

## 2021-08-12 MED ORDER — ACETAMINOPHEN 325 MG PO TABS: 325.0000 mg | ORAL_TABLET | Freq: Four times a day (QID) | ORAL | Status: DC | PRN

## 2021-08-12 MED ORDER — BUPIVACAINE HCL (PF) 0.5 % IJ SOLN
INTRAMUSCULAR | Status: DC | PRN
  Administered 2021-08-12: 3 mL

## 2021-08-12 MED ORDER — OXYCODONE HCL 5 MG PO TABS: 5.0000 mg | ORAL_TABLET | Freq: Once | ORAL | Status: DC | PRN

## 2021-08-12 MED ORDER — MAGNESIUM HYDROXIDE 400 MG/5ML PO SUSP
30.0000 mL | Freq: Every day | ORAL | Status: DC
  Administered 2021-08-12 – 2021-08-13 (×2): 30 mL via ORAL
  Filled 2021-08-12 (×2): qty 30

## 2021-08-12 MED ORDER — BUPIVACAINE HCL (PF) 0.25 % IJ SOLN
INTRAMUSCULAR | Status: DC | PRN
  Administered 2021-08-12: 60 mL

## 2021-08-12 MED ORDER — ENSURE PRE-SURGERY PO LIQD
296.0000 mL | Freq: Once | ORAL | Status: AC
  Administered 2021-08-12: 296 mL via ORAL

## 2021-08-12 MED ORDER — DEXAMETHASONE SODIUM PHOSPHATE 10 MG/ML IJ SOLN
INTRAMUSCULAR | Status: AC
  Filled 2021-08-12: qty 1

## 2021-08-12 MED ORDER — CHLORHEXIDINE GLUCONATE 0.12 % MT SOLN
15.0000 mL | Freq: Once | OROMUCOSAL | Status: AC
  Administered 2021-08-12: 15 mL via OROMUCOSAL

## 2021-08-12 SURGICAL SUPPLY — 73 items
ATTUNE MED DOME PAT 41 KNEE (Knees) ×1 IMPLANT
ATTUNE PS FEM LT SZ 7 CEM KNEE (Femur) ×1 IMPLANT
ATTUNE PSRP INSR SZ7 6 KNEE (Insert) ×1 IMPLANT
BASE TIBIAL ROT PLAT SZ 8 KNEE (Knees) IMPLANT
BATTERY INSTRU NAVIGATION (MISCELLANEOUS) ×8 IMPLANT
BLADE SAW 70X12.5 (BLADE) ×2 IMPLANT
BLADE SAW 90X13X1.19 OSCILLAT (BLADE) ×2 IMPLANT
BLADE SAW 90X25X1.19 OSCILLAT (BLADE) ×2 IMPLANT
CATH COUDE FOLEY 5CC 14FR (CATHETERS) ×1 IMPLANT
CATH PEDI SILICON 3CC 8FR (CATHETERS) ×1 IMPLANT
CEMENT HV SMART SET (Cement) ×2 IMPLANT
COOLER POLAR GLACIER W/PUMP (MISCELLANEOUS) ×2 IMPLANT
CUFF TOURN SGL QUICK 24 (TOURNIQUET CUFF)
CUFF TOURN SGL QUICK 34 (TOURNIQUET CUFF)
CUFF TRNQT CYL 24X4X16.5-23 (TOURNIQUET CUFF) IMPLANT
CUFF TRNQT CYL 34X4.125X (TOURNIQUET CUFF) IMPLANT
DRAPE 3/4 80X56 (DRAPES) ×2 IMPLANT
DRAPE INCISE IOBAN 66X45 STRL (DRAPES) IMPLANT
DRSG DERMACEA 8X12 NADH (GAUZE/BANDAGES/DRESSINGS) ×2 IMPLANT
DRSG MEPILEX SACRM 8.7X9.8 (GAUZE/BANDAGES/DRESSINGS) ×2 IMPLANT
DRSG OPSITE POSTOP 4X14 (GAUZE/BANDAGES/DRESSINGS) ×2 IMPLANT
DRSG TEGADERM 4X4.75 (GAUZE/BANDAGES/DRESSINGS) ×2 IMPLANT
DURAPREP 26ML APPLICATOR (WOUND CARE) ×4 IMPLANT
ELECT CAUTERY BLADE 6.4 (BLADE) ×2 IMPLANT
ELECT REM PT RETURN 9FT ADLT (ELECTROSURGICAL) ×2
ELECTRODE REM PT RTRN 9FT ADLT (ELECTROSURGICAL) ×1 IMPLANT
EX-PIN ORTHOLOCK NAV 4X150 (PIN) ×4 IMPLANT
GLOVE BIOGEL M STRL SZ7.5 (GLOVE) ×8 IMPLANT
GLOVE BIOGEL PI IND STRL 8 (GLOVE) ×1 IMPLANT
GLOVE BIOGEL PI INDICATOR 8 (GLOVE) ×1
GLOVE SURG UNDER POLY LF SZ7.5 (GLOVE) ×2 IMPLANT
GOWN STRL REUS W/ TWL LRG LVL3 (GOWN DISPOSABLE) ×2 IMPLANT
GOWN STRL REUS W/ TWL XL LVL3 (GOWN DISPOSABLE) ×1 IMPLANT
GOWN STRL REUS W/TWL LRG LVL3 (GOWN DISPOSABLE) ×2
GOWN STRL REUS W/TWL XL LVL3 (GOWN DISPOSABLE) ×1
HEMOVAC 400CC 10FR (MISCELLANEOUS) ×2 IMPLANT
HOLDER FOLEY CATH W/STRAP (MISCELLANEOUS) ×2 IMPLANT
HOLSTER ELECTROSUGICAL PENCIL (MISCELLANEOUS) ×2 IMPLANT
HOOD PEEL AWAY FLYTE STAYCOOL (MISCELLANEOUS) ×4 IMPLANT
IV NS IRRIG 3000ML ARTHROMATIC (IV SOLUTION) ×2 IMPLANT
KIT TURNOVER KIT A (KITS) ×2 IMPLANT
KNIFE SCULPS 14X20 (INSTRUMENTS) ×2 IMPLANT
MANIFOLD NEPTUNE II (INSTRUMENTS) ×4 IMPLANT
NDL SPNL 20GX3.5 QUINCKE YW (NEEDLE) ×2 IMPLANT
NEEDLE SPNL 20GX3.5 QUINCKE YW (NEEDLE) ×4 IMPLANT
NS IRRIG 500ML POUR BTL (IV SOLUTION) ×2 IMPLANT
PACK TOTAL KNEE (MISCELLANEOUS) ×2 IMPLANT
PAD ABD DERMACEA PRESS 5X9 (GAUZE/BANDAGES/DRESSINGS) ×4 IMPLANT
PAD WRAPON POLAR KNEE (MISCELLANEOUS) ×1 IMPLANT
PIN DRILL FIX HALF THREAD (BIT) ×4 IMPLANT
PIN DRILL QUICK PACK ×4 IMPLANT
PIN FIXATION 1/8DIA X 3INL (PIN) ×2 IMPLANT
PULSAVAC PLUS IRRIG FAN TIP (DISPOSABLE) ×2
SOL PREP PVP 2OZ (MISCELLANEOUS) ×2
SOLUTION IRRIG SURGIPHOR (IV SOLUTION) ×2 IMPLANT
SOLUTION PREP PVP 2OZ (MISCELLANEOUS) ×1 IMPLANT
SPONGE DRAIN TRACH 4X4 STRL 2S (GAUZE/BANDAGES/DRESSINGS) ×2 IMPLANT
STAPLER SKIN PROX 35W (STAPLE) ×2 IMPLANT
STOCKINETTE IMPERV 14X48 (MISCELLANEOUS) ×1 IMPLANT
STRAP TIBIA SHORT (MISCELLANEOUS) ×2 IMPLANT
SUCTION FRAZIER HANDLE 10FR (MISCELLANEOUS) ×1
SUCTION TUBE FRAZIER 10FR DISP (MISCELLANEOUS) ×1 IMPLANT
SUT VIC AB 0 CT1 36 (SUTURE) ×4 IMPLANT
SUT VIC AB 1 CT1 36 (SUTURE) ×4 IMPLANT
SUT VIC AB 2-0 CT2 27 (SUTURE) ×2 IMPLANT
SYR 30ML LL (SYRINGE) ×4 IMPLANT
TIBIAL BASE ROT PLAT SZ 8 KNEE (Knees) ×2 IMPLANT
TIP FAN IRRIG PULSAVAC PLUS (DISPOSABLE) ×1 IMPLANT
TOWEL OR 17X26 4PK STRL BLUE (TOWEL DISPOSABLE) IMPLANT
TOWER CARTRIDGE SMART MIX (DISPOSABLE) ×2 IMPLANT
TRAY FOLEY MTR SLVR 16FR STAT (SET/KITS/TRAYS/PACK) ×2 IMPLANT
WATER STERILE IRR 500ML POUR (IV SOLUTION) ×2 IMPLANT
WRAPON POLAR PAD KNEE (MISCELLANEOUS) ×2

## 2021-08-12 NOTE — OR Nursing (Signed)
When case was completed, Dr Erlene Quan was in another case and could not come to see pt to insert catheter. I inserted a 10 f and drained the pt's bladder at the end of the case, approximately 300 ml  of clear yellow urine was removed.

## 2021-08-12 NOTE — Progress Notes (Signed)
PT Cancellation Note  Patient Details Name: SUKHRAJ ESQUIVIAS MRN: [id number] DOB: April 13, 1945   Cancelled Treatment:    Reason Eval/Treat Not Completed: Other (comment): Per conversation with nursing pt does not yet have sufficient return of LE strength and/or sensation to safely participate with PT services. Will attempt to see pt at a future date/time as medically appropriate.    Linus Salmons PT, DPT 08/12/21, 4:44 PM

## 2021-08-12 NOTE — Anesthesia Procedure Notes (Signed)
Date/Time: 08/12/2021 12:00 PM Performed by: Nelda Marseille, CRNA Pre-anesthesia Checklist: Patient identified, Emergency Drugs available, Suction available, Patient being monitored and Timeout performed Oxygen Delivery Method: Simple face mask

## 2021-08-12 NOTE — Anesthesia Preprocedure Evaluation (Signed)
Anesthesia Evaluation  Patient identified by MRN, date of birth, ID band Patient awake    Reviewed: Allergy & Precautions, NPO status , Patient's Chart, lab work & pertinent test results  History of Anesthesia Complications Negative for: history of anesthetic complications  Airway Mallampati: III  TM Distance: <3 FB Neck ROM: full    Dental  (+) Chipped   Pulmonary neg pulmonary ROS, neg shortness of breath,    Pulmonary exam normal        Cardiovascular Exercise Tolerance: Good + CAD  Normal cardiovascular exam+ dysrhythmias + Cardiac Defibrillator      Neuro/Psych negative neurological ROS  negative psych ROS   GI/Hepatic Neg liver ROS, GERD  Controlled,  Endo/Other  negative endocrine ROS  Renal/GU      Musculoskeletal   Abdominal   Peds  Hematology negative hematology ROS (+)   Anesthesia Other Findings Past Medical History: No date: AICD (automatic cardioverter/defibrillator) present No date: Arthritis 03/22/2019: Ascending aorta dilation ([place])     Comment:  a.) TTE 03/22/2019: Ao root measured 2m. b.) TTE               03/13/2020: Ao root 352m Asc. Ao 3763m2/19/2019: AV dissociation No date: BPH (benign prostatic hyperplasia) No date: CAD (coronary artery disease)     Comment:  a.) LHC 04/28/2017: severe LV dysfunction with EF               25-30%; global HK, LVEDP 14 mmHg; no obstructive CAD. No date: Cataract No date: Dilated cardiomyopathy ([place]   Comment:  a.) LHC 04/28/2017: EF 35-35%. b.) TTE 04/29/2017:               30-35%. c.) TTE 03/22/2019: 50-55%. d.) TTE 03/13/2020:               EF45-50%. 2017: Diverticulosis 12/20/2014: Dysphagia No date: Esophagitis No date: First degree AV block No date: GERD (gastroesophageal reflux disease) 04/29/2017: HFrEF (heart failure with reduced ejection fraction) ([place]   Comment:  a.) TTE 04/29/2017: mod-sev LV dysfunction; EF 30-35%;                global HK; G1DD. b.) Cardiac MRI 04/29/2017:               biventricular/biatrial enlargement; LVEF 27%; global HK.               c.) TTE 03/13/2020: EF 45-50%; global HK; mild LVH; GLS               -12.7%; AoV sclerosis without stenosis; mild MR, triv TR;              G1DD. No date: Hypercholesterolemia No date: ICD (implantable cardioverter-defibrillator) in place     Comment:  a.) St. Judes device No date: NICM (nonischemic cardiomyopathy) ([place]   Comment:  a.) TTE 04/29/2017: EF 30-35%. b.) Cardiac MRI               04/29/2017: EF 27%. No date: Palpitations No date: Stricture and stenosis of esophagus 03/2021: Urethral stricture     Comment:  a.) encountered intraoperatively in 03/2021 (hip               surgery); required intraop urology consult (BrErlene QuanD)               for foley catheter placement. 12/20/2014: Varicose veins of both lower extremities 04/28/2017: Ventricular tachycardia (HCC)     Comment:  a.)  s/p St. Jude dual-chamber ICD placement  Past Surgical History: 04/28/2017: CARDIAC CATHETERIZATION 2017: CATARACT EXTRACTION W/ INTRAOCULAR LENS IMPLANT; Right 05/20/2019: CATARACT EXTRACTION W/PHACO; Left     Comment:  Procedure: CATARACT EXTRACTION PHACO AND INTRAOCULAR               LENS PLACEMENT ([place]) LEFT;  Surgeon: Birder Robson,               MD;  Location: [facility] OR[facility];  Service: Ophthalmology;                Laterality: Left;  [place] 01:19.7 CDE 14.58 Fluid Pack Lot               # [id number] No date: COLONOSCOPY 02/19/2015: ESOPHAGOGASTRODUODENOSCOPY (EGD) WITH PROPOFOL; N/A     Comment:  Procedure: ESOPHAGOGASTRODUODENOSCOPY (EGD) WITH               PROPOFOL and esophageal dilation.;  Surgeon: Lucilla Lame,              MD;  Location: [place];  Service: Endoscopy;               Laterality: N/A; No date: EYE SURGERY 04/29/2017: ICD IMPLANT; N/A     Comment:  Procedure: ICD IMPLANT;  Surgeon: Evans Lance, MD;                Location:  [place] [facility];  Service: Cardiovascular;                Laterality: N/A; 04/28/2017: LEFT HEART CATH AND CORONARY ANGIOGRAPHY; N/A     Comment:  Procedure: LEFT HEART CATH AND CORONARY ANGIOGRAPHY;                Surgeon: Martinique, Peter M, MD;  Location: [place] [facility]               [facility];  Service: Cardiovascular;  Laterality: N/A; ~ 2012: PROSTATE BIOPSY 1990s: RETINAL DETACHMENT SURGERY; Right     Comment:  "torn retina" 03/20/2021: TOTAL HIP ARTHROPLASTY; Right     Comment:  Procedure: TOTAL HIP ARTHROPLASTY;  Surgeon: Dereck Leep, MD;  Location: [facility] OR[facility];  Service: Orthopedics;               Laterality: Right;  BMI    Body Mass Index: 26.17 kg/m      Reproductive/Obstetrics negative OB ROS                             Anesthesia Physical Anesthesia Plan  ASA: 4  Anesthesia Plan: Spinal   Post-op Pain Management:    Induction:   PONV Risk Score and Plan:   Airway Management Planned: Natural Airway and Nasal Cannula  Additional Equipment:   Intra-op Plan:   Post-operative Plan:   Informed Consent: I have reviewed the patients History and Physical, chart, labs and discussed the procedure including the risks, benefits and alternatives for the proposed anesthesia with the patient or authorized representative who has indicated his/her understanding and acceptance.     Dental Advisory Given  Plan Discussed with: Anesthesiologist, CRNA and Surgeon  Anesthesia Plan Comments: (Patient reports no bleeding problems and no anticoagulant use.  Plan for spinal with backup GA  Patient consented for risks of anesthesia including but not limited to:  - adverse reactions to medications - damage to  eyes, teeth, lips or other oral mucosa - nerve damage due to positioning  - risk of bleeding, infection and or nerve damage from spinal that could lead to paralysis - risk of headache or failed spinal - damage to teeth, lips or other  oral mucosa - sore throat or hoarseness - damage to heart, brain, nerves, lungs, other parts of body or loss of life  Patient voiced understanding.)        Anesthesia Quick Evaluation

## 2021-08-12 NOTE — Progress Notes (Signed)
Patient self cath'd himself with #35f without adverse reactions, maintain sterile technique with nurse at bedside. Return of 6033mclear yellow urine.  Per orders of Dr. HoMarry Guant may self cath himself.

## 2021-08-12 NOTE — Plan of Care (Signed)
?  Problem: Clinical Measurements: ?Goal: Will remain free from infection ?Outcome: Progressing ?  ?

## 2021-08-12 NOTE — Transfer of Care (Signed)
Immediate Anesthesia Transfer of Care Note  Patient: KALIB BHAGAT  Procedure(s) Performed: COMPUTER ASSISTED TOTAL KNEE ARTHROPLASTY (Left: Knee)  Patient Location: PACU  Anesthesia Type:Spinal  Level of Consciousness: awake, alert  and oriented  Airway & Oxygen Therapy: Patient Spontanous Breathing and Patient connected to face mask oxygen  Post-op Assessment: Report given to RN and Post -op Vital signs reviewed and stable  Post vital signs: Reviewed and stable  Last Vitals:  Vitals Value Taken Time  BP 84/61 08/12/21 1538  Temp    Pulse 68 08/12/21 1541  Resp 18 08/12/21 1541  SpO2 97 % 08/12/21 1541  Vitals shown include unvalidated device data.  Last Pain:  Vitals:   08/12/21 0939  TempSrc: Temporal  PainSc: 5          Complications: No notable events documented.

## 2021-08-12 NOTE — Op Note (Signed)
OPERATIVE NOTE  DATE OF SURGERY:  08/12/2021  PATIENT NAME:  LYNDLE PANG   DOB: 24-Jan-1946  MRN: [id number]  PRE-OPERATIVE DIAGNOSIS: Degenerative arthrosis of the left knee, primary  POST-OPERATIVE DIAGNOSIS:  Same  PROCEDURE:  Left total knee arthroplasty using computer-assisted navigation  SURGEON:  Marciano Sequin. M.D.  ASSISTANT: Cassell Smiles, PA-C (present and scrubbed throughout the case, critical for assistance with exposure, retraction, instrumentation, and closure)  ANESTHESIA: spinal  ESTIMATED BLOOD LOSS: 50 mL  FLUIDS REPLACED: 800 mL of crystalloid  TOURNIQUET TIME: 94 minutes  DRAINS: 2 medium Hemovac drains  SOFT TISSUE RELEASES: Anterior cruciate ligament, posterior cruciate ligament, deep and superficial medial collateral ligament, patellofemoral ligament  IMPLANTS UTILIZED: DePuy Attune size 7 posterior stabilized femoral component (cemented), size 8 rotating platform tibial component (cemented), 41 mm medialized dome patella (cemented), and a 6 mm stabilized rotating platform polyethylene insert.  INDICATIONS FOR SURGERY: NELS MUNN is a 76 y.o. year old male with a long history of progressive knee pain. X-rays demonstrated severe degenerative changes in tricompartmental fashion. The patient had not seen any significant improvement despite conservative nonsurgical intervention. After discussion of the risks and benefits of surgical intervention, the patient expressed understanding of the risks benefits and agree with plans for total knee arthroplasty.   The risks, benefits, and alternatives were discussed at length including but not limited to the risks of infection, bleeding, nerve injury, stiffness, blood clots, the need for revision surgery, cardiopulmonary complications, among others, and they were willing to proceed.  PROCEDURE IN DETAIL: The patient was brought into the operating room and, after adequate spinal anesthesia was achieved, a tourniquet  was placed on the patient's upper thigh. The patient's knee and leg were cleaned and prepped with alcohol and DuraPrep and draped in the usual sterile fashion. A "timeout" was performed as per usual protocol. The lower extremity was exsanguinated using an Esmarch, and the tourniquet was inflated to 300 mmHg. An anterior longitudinal incision was made followed by a standard mid vastus approach. The deep fibers of the medial collateral ligament were elevated in a subperiosteal fashion off of the medial flare of the tibia so as to maintain a continuous soft tissue sleeve. The patella was subluxed laterally and the patellofemoral ligament was incised. Inspection of the knee demonstrated severe degenerative changes with full-thickness loss of articular cartilage. Osteophytes were debrided using a rongeur. Anterior and posterior cruciate ligaments were excised. Two 4.0 mm Schanz pins were inserted in the femur and into the tibia for attachment of the array of trackers used for computer-assisted navigation. Hip center was identified using a circumduction technique. Distal landmarks were mapped using the computer. The distal femur and proximal tibia were mapped using the computer. The distal femoral cutting guide was positioned using computer-assisted navigation so as to achieve a 5 distal valgus cut. The femur was sized and it was felt that a size 7 femoral component was appropriate. A size 7 femoral cutting guide was positioned and the anterior cut was performed and verified using the computer. This was followed by completion of the posterior and chamfer cuts. Femoral cutting guide for the central box was then positioned in the center box cut was performed.  Attention was then directed to the proximal tibia. Medial and lateral menisci were excised. The extramedullary tibial cutting guide was positioned using computer-assisted navigation so as to achieve a 0 varus-valgus alignment and 3 posterior slope. The cut was  performed and verified using the computer. The proximal  tibia was sized and it was felt that a size 8 tibial tray was appropriate. Tibial and femoral trials were inserted followed by insertion of a 6 mm polyethylene insert. The knee was felt to be tight medially. A Cobb elevator was used to elevate the superficial fibers of the medial collateral ligament.  This allowed for excellent mediolateral soft tissue balancing both in flexion and in full extension. Finally, the patella was cut and prepared so as to accommodate a 41 mm medialized dome patella. A patella trial was placed and the knee was placed through a range of motion with excellent patellar tracking appreciated. The femoral trial was removed after debridement of posterior osteophytes. The central post-hole for the tibial component was reamed followed by insertion of a keel punch. Tibial trials were then removed. Cut surfaces of bone were irrigated with copious amounts of normal saline using pulsatile lavage and then suctioned dry. Polymethylmethacrylate cement was prepared in the usual fashion using a vacuum mixer. Cement was applied to the cut surface of the proximal tibia as well as along the undersurface of a size 8 rotating platform tibial component. Tibial component was positioned and impacted into place. Excess cement was removed using [employer] fast streamer. Cement was then applied to the cut surfaces of the femur as well as along the posterior flanges of the size 7 femoral component. The femoral component was positioned and impacted into place. Excess cement was removed using [employer] fast streamer. A 6 mm polyethylene trial was inserted and the knee was brought into full extension with steady axial compression applied. Finally, cement was applied to the backside of a 41 mm medialized dome patella and the patellar component was positioned and patellar clamp applied. Excess cement was removed using [employer] fast streamer. After adequate curing of the cement, the  tourniquet was deflated after a total tourniquet time of 94 minutes. Hemostasis was achieved using electrocautery. The knee was irrigated with copious amounts of normal saline using pulsatile lavage followed by 450 ml of Surgiphor and then suctioned dry. 20 mL of 1.3% Exparel and 60 mL of 0.25% Marcaine in 40 mL of normal saline was injected along the posterior capsule, medial and lateral gutters, and along the arthrotomy site. A 6 mm stabilized rotating platform polyethylene insert was inserted and the knee was placed through a range of motion with excellent mediolateral soft tissue balancing appreciated and excellent patellar tracking noted. 2 medium drains were placed in the wound bed and brought out through separate stab incisions. The medial parapatellar portion of the incision was reapproximated using interrupted sutures of #1 Vicryl. Subcutaneous tissue was approximated in layers using first #0 Vicryl followed #2-0 Vicryl. The skin was approximated with skin staples. A sterile dressing was applied.  The patient tolerated the procedure well and was transported to the recovery room in stable condition.    Oliva Montecalvo P. Holley Bouche., M.D.

## 2021-08-12 NOTE — Progress Notes (Signed)
Patient to move bil lower ext side to side, bend right knee. Sensation intact, no c/o's pain, medicated as ordered

## 2021-08-12 NOTE — H&P (Signed)
The patient has been re-examined, and the chart reviewed, and there have been no interval changes to the documented history and physical.    The risks, benefits, and alternatives have been discussed at length. The patient expressed understanding of the risks benefits and agreed with plans for surgical intervention.  Psalm Schappell P. Wilmar Prabhakar, Jr. M.D.    

## 2021-08-12 NOTE — OR Nursing (Signed)
Ronnell Gu[place] RN and [facility] both attempted to insert a 42f Coude. Pt has a tight stricture and coude would not advance beyond the opening. Consulted with Urologist that were in [place] They will come by surgical suite at end of case to insert catheter to empty pt's bladder.

## 2021-08-13 ENCOUNTER — Encounter: Payer: Self-pay | Admitting: Orthopedic Surgery

## 2021-08-13 DIAGNOSIS — I5022 Chronic systolic (congestive) heart failure: Secondary | ICD-10-CM | POA: Diagnosis not present

## 2021-08-13 DIAGNOSIS — Z79899 Other long term (current) drug therapy: Secondary | ICD-10-CM | POA: Diagnosis not present

## 2021-08-13 DIAGNOSIS — Z95810 Presence of automatic (implantable) cardiac defibrillator: Secondary | ICD-10-CM | POA: Diagnosis not present

## 2021-08-13 DIAGNOSIS — M1712 Unilateral primary osteoarthritis, left knee: Secondary | ICD-10-CM | POA: Diagnosis not present

## 2021-08-13 DIAGNOSIS — Z96641 Presence of right artificial hip joint: Secondary | ICD-10-CM | POA: Diagnosis not present

## 2021-08-13 DIAGNOSIS — I2510 Atherosclerotic heart disease of native coronary artery without angina pectoris: Secondary | ICD-10-CM | POA: Diagnosis not present

## 2021-08-13 MED ORDER — TRAMADOL HCL 50 MG PO TABS: 50.0000 mg | ORAL_TABLET | ORAL | 0 refills | Status: DC | PRN

## 2021-08-13 MED ORDER — ENOXAPARIN SODIUM 40 MG/0.4ML IJ SOSY: 40.0000 mg | PREFILLED_SYRINGE | INTRAMUSCULAR | 0 refills | Status: DC

## 2021-08-13 MED ORDER — OXYCODONE HCL 5 MG PO TABS: 5.0000 mg | ORAL_TABLET | ORAL | 0 refills | Status: DC | PRN

## 2021-08-13 MED ORDER — CELECOXIB 200 MG PO CAPS: 200.0000 mg | ORAL_CAPSULE | Freq: Two times a day (BID) | ORAL | 0 refills | Status: DC

## 2021-08-13 NOTE — Progress Notes (Signed)
  Subjective: 1 Day Post-Op Procedure(s) (LRB): COMPUTER ASSISTED TOTAL KNEE ARTHROPLASTY (Left) Patient reports pain as well-controlled.  Wife at bedside.  Patient is well, and has had no acute complaints or problems Plan is to go Home after hospital stay. Negative for chest pain and shortness of breath Fever: no Gastrointestinal: positive for nausea and vomiting yesterday, has resolved .  Patient has not had a bowel movement.  Objective: Vital signs in last 24 hours: Temp:  [97.1 F (36.2 C)-98 F (36.7 C)] 97.8 F (36.6 C) (06/06 0512) Pulse Rate:  [58-71] 68 (06/06 0512) Resp:  [12-17] 17 (06/06 0512) BP: (84-145)/(53-88) 113/81 (06/06 0512) SpO2:  [98 %-100 %] 99 % (06/06 0512) Weight:  [82.7 kg] 82.7 kg (06/05 0939)  Intake/Output from previous day:  Intake/Output Summary (Last 24 hours) at 08/13/2021 0815 Last data filed at 08/13/2021 0700 Gross per 24 hour  Intake 2204.49 ml  Output 2070 ml  Net 134.49 ml    Intake/Output this shift: No intake/output data recorded.  Labs: No results for input(s): HGB in the last 72 hours. No results for input(s): WBC, RBC, HCT, PLT in the last 72 hours. No results for input(s): NA, K, CL, CO2, BUN, CREATININE, GLUCOSE, CALCIUM in the last 72 hours. No results for input(s): LABPT, INR in the last 72 hours.   EXAM General - Patient is Alert, Appropriate, and Oriented Extremity - Neurovascular intact Dorsiflexion/Plantar flexion intact Compartment soft Dressing/Incision -Postoperative dressing remains in place., Hemovac in place.  Motor Function - intact, moving foot and toes well on exam. Able to perform independent SLR.  Cardiovascular- Regular rate and rhythm, no murmurs/rubs/gallops Respiratory- Lungs clear to auscultation bilaterally Gastrointestinal- soft, nontender, and active bowel sounds   Assessment/Plan: 1 Day Post-Op Procedure(s) (LRB): COMPUTER ASSISTED TOTAL KNEE ARTHROPLASTY (Left) Principal Problem:   Total  knee replacement status  Estimated body mass index is 26.17 kg/m as calculated from the following:   Height as of this encounter: '5\' 10"'$  (1.778 m).   Weight as of this encounter: 82.7 kg. Advance diet Up with therapy  Discharge to home pending completion of PT goals.  Per MD, patient will continue to self-cath as he has been doing this at home.     DVT Prophylaxis - Lovenox, Ted hose, and SCDs Weight-Bearing as tolerated to left leg  Cassell Smiles, PA-C [facility] 08/13/2021, 8:15 AM

## 2021-08-13 NOTE — Progress Notes (Signed)
Physical Therapy Treatment Patient Details Name: Leroy Merritt MRN: [id number] DOB: 25-Feb-1946 Today's Date: 08/13/2021   History of Present Illness Pt is a 76 yo M diagnosed with degenerative arthrosis of the left knee and is s/p elective L TKA. 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    PT Comments    Pt was pleasant and motivated to participate during the session and put forth good effort throughout. Pt performed sit to stand transfers with supervision to RW and required min verbal cues for LLE placement. Pt was able to perform stair negotiation with supervision and required min verbal cues to foot placement during ascent/descent and had good stability throughout. Family present during training and understood assist level needed to aid pt. Pt ambulated around nursing station with supervision using RW with good stability and no LOB. Slight increase pain from 2 to 4/10 but did not hinder performance.  Pt will benefit from HHPT upon discharge to safely address deficits listed in patient problem list for decreased caregiver assistance and eventual return to PLOF.     Recommendations for follow up therapy are one component of a multi-disciplinary discharge planning process, led by the attending physician.  Recommendations may be updated based on patient status, additional functional criteria and insurance authorization.  Follow Up Recommendations  Home health PT     Assistance Recommended at Discharge Intermittent Supervision/Assistance  Patient can return home with the following A little help with walking and/or transfers;A little help with bathing/dressing/bathroom;Assistance with cooking/housework;Help with stairs or ramp for entrance;Assist for transportation   Equipment Recommendations  None recommended by PT    Recommendations for Other Services       Precautions / Restrictions Precautions Precautions: Knee Restrictions Weight Bearing  Restrictions: Yes LLE Weight Bearing: Weight bearing as tolerated Other Position/Activity Restrictions: Pt able to perform Ind LLE SLR without extensor lag, no KI required     Mobility  Bed Mobility Overal bed mobility: Needs Assistance             General bed mobility comments: Pt in recliner at the start/end of session    Transfers Overall transfer level: Needs assistance Equipment used: Rolling walker (2 wheels) Transfers: Sit to/from Stand Sit to Stand: Supervision           General transfer comment: Min verbal cues for sequencing, good eccentric and concentric control and stability    Ambulation/Gait Ambulation/Gait assistance: Supervision Gait Distance (Feet): 160 Feet Assistive device: Rolling walker (2 wheels) Gait Pattern/deviations: Step-through pattern, Decreased step length - right, Decreased stance time - left, Antalgic Gait velocity: decreased     General Gait Details: Pt minimally antalgic on the LLE but was stable with no LOB   Stairs Stairs: Yes Stairs assistance: Supervision Stair Management: Two rails Number of Stairs: 4 (x2) General stair comments: Good concentric/eccentric control and no LOB. Min cuing for foot placement and walker management   Wheelchair Mobility    Modified Rankin (Stroke Patients Only)       Balance Overall balance assessment: Modified Independent Sitting-balance support: Feet unsupported Sitting balance-Leahy Scale: Good     Standing balance support: Bilateral upper extremity supported, During functional activity Standing balance-Leahy Scale: Good                              Cognition Arousal/Alertness: Awake/alert Behavior During Therapy: WFL for tasks assessed/performed Overall Cognitive Status: Within Functional Limits for tasks assessed  Exercises Other Exercises Other Exercises: Education on car transfer sequence    General  Comments        Pertinent Vitals/Pain Pain Assessment Pain Assessment: 0-10 Pain Score: 2  Pain Location: L knee Pain Descriptors / Indicators: Discomfort, Aching Pain Intervention(s): Premedicated before session, Monitored during session, Repositioned, Ice applied    Home Living                          Prior Function            PT Goals (current goals can now be found in the care plan section) Progress towards PT goals: Progressing toward goals    Frequency    BID      PT Plan Current plan remains appropriate    Co-evaluation              AM-PAC PT "6 Clicks" Mobility   Outcome Measure  Help needed turning from your back to your side while in a flat bed without using bedrails?: A Little Help needed moving from lying on your back to sitting on the side of a flat bed without using bedrails?: A Little Help needed moving to and from a bed to a chair (including a wheelchair)?: A Little Help needed standing up from a chair using your arms (e.g., wheelchair or bedside chair)?: A Little Help needed to walk in hospital room?: A Little Help needed climbing 3-5 steps with a railing? : A Little 6 Click Score: 18    End of Session Equipment Utilized During Treatment: Gait belt Activity Tolerance: Patient tolerated treatment well Patient left: in chair;with call bell/phone within reach;with chair alarm set;with family/visitor present Nurse Communication: Mobility status;Weight bearing status PT Visit Diagnosis: Other abnormalities of gait and mobility (R26.89);Muscle weakness (generalized) (M62.81);Pain Pain - Right/Left: Left Pain - part of body: Knee     Time: 1335-1400 PT Time Calculation (min) (ACUTE ONLY): 25 min  Charges:                        Turner Daniels, SPT  08/13/2021, 3:51 PM

## 2021-08-13 NOTE — TOC Progression Note (Signed)
Transition of Care [facility]) - Progression Note    Patient Details  Name: Leroy Merritt MRN: [id number] Date of Birth: Aug 18, 1945  [facility] CM/SW [place], RN Phone Number: 08/13/2021, 9:43 AM  Clinical Narrative:    The patient is open with [place] [facility] services prior to surgery by surgeons office        Expected Discharge Plan and Services                                                 Social Determinants of Health (SDOH) Interventions    Readmission Risk Interventions     View : No data to display.

## 2021-08-13 NOTE — Evaluation (Signed)
Occupational Therapy Evaluation Patient Details Name: Leroy Merritt MRN: [id number] DOB: May 09, 1945 Today's Date: 08/13/2021   History of Present Illness Pt is 76 y/o s/p  L TKA with PMH of diverticulosis, BPH, pacemaker, and R THR ( jan 2023).   Clinical Impression   Upon entering the room, pt supine in bed with wife and daughter present in room. Pt reports 2/10 pain in L LE and is agreeable to OT intervention. OT reviewed polar care system with teach back method during session. Pt performed bed mobility at mod I level and stands with RW and supervision. Pt transfers to recliner chair with min cuing for technique and hand placement but no physical assistance. OT educates pt on how to increase independence with LB clothing management and he is able to demonstrate reaching to L foot and adjusting socks without assistance. Pt verbalized understanding. Pt's wife able to hook up polar care after transfer and education. All needs within reach and pt with no further OT concerns at this time. OT to SIGN OFF. Pt has all needed equipment.      Recommendations for follow up therapy are one component of a multi-disciplinary discharge planning process, led by the attending physician.  Recommendations may be updated based on patient status, additional functional criteria and insurance authorization.   Follow Up Recommendations  No OT follow up    Assistance Recommended at Discharge PRN  Patient can return home with the following Assist for transportation;Help with stairs or ramp for entrance       Equipment Recommendations  None recommended by OT       Precautions / Restrictions Precautions Precautions: Knee Precaution Booklet Issued: No Restrictions Weight Bearing Restrictions: Yes LLE Weight Bearing: Weight bearing as tolerated      Mobility Bed Mobility Overal bed mobility: Modified Independent         Sit to supine: HOB elevated   General bed mobility comments: no physical  assistance provided    Transfers Overall transfer level: Needs assistance   Transfers: Sit to/from Stand, Bed to chair/wheelchair/BSC Sit to Stand: Supervision     Step pivot transfers: Supervision     General transfer comment: with RW and cuing for safety      Balance Overall balance assessment: Needs assistance Sitting-balance support: Feet supported Sitting balance-Leahy Scale: Good     Standing balance support: Reliant on assistive device for balance, During functional activity Standing balance-Leahy Scale: Fair                             ADL either performed or assessed with clinical judgement   ADL Overall ADL's : Needs assistance/impaired                                       General ADL Comments: supervision overall. Pt is able to demonstrate reaching down to L foot to adjust sock without issue.     Vision Patient Visual Report: No change from baseline              Pertinent Vitals/Pain Pain Assessment Pain Assessment: 0-10 Pain Score: 2  Pain Location: L knee Pain Descriptors / Indicators: Discomfort, Aching Pain Intervention(s): Premedicated before session, Repositioned, Monitored during session, Ice applied     Hand Dominance Right   Extremity/Trunk Assessment Upper Extremity Assessment Upper Extremity Assessment: Overall WFL for tasks assessed  Lower Extremity Assessment Lower Extremity Assessment: Defer to PT evaluation       Communication Communication Communication: No difficulties   Cognition Arousal/Alertness: Awake/alert Behavior During Therapy: WFL for tasks assessed/performed Overall Cognitive Status: Within Functional Limits for tasks assessed                                 General Comments: Pt is pleasant and cooperative                Home Living Family/patient expects to be discharged to:: Private residence Living Arrangements: Spouse/significant other Available Help at  Discharge: Family;Available 24 hours/day Type of Home: House Home Access: Stairs to enter [employer] of Steps: 5 Entrance Stairs-Rails: Can reach both Home Layout: Able to live on main level with bedroom/bathroom     Bathroom Shower/Tub: Tub/shower unit;Sponge bathes at baseline         Home Equipment: Conservation officer, nature (2 wheels);Cane - single point;BSC/3in1          Prior Functioning/Environment Prior Level of Function : Independent/Modified Independent             Mobility Comments: mod I with use of AD on PRN basis ADLs Comments: mod I for self care tasks                 OT Goals(Current goals can be found in the care plan section) Acute Rehab OT Goals Patient Stated Goal: to go home OT Goal Formulation: With patient/family Time For Goal Achievement: 08/13/21 Potential to Achieve Goals: Good  OT Frequency:         AM-PAC OT "6 Clicks" Daily Activity     Outcome Measure Help from another person eating meals?: None Help from another person taking care of personal grooming?: None Help from another person toileting, which includes using toliet, bedpan, or urinal?: None Help from another person bathing (including washing, rinsing, drying)?: None Help from another person to put on and taking off regular upper body clothing?: None Help from another person to put on and taking off regular lower body clothing?: None 6 Click Score: 24   End of Session Equipment Utilized During Treatment: Rolling walker (2 wheels) Nurse Communication: Mobility status  Activity Tolerance: Patient tolerated treatment well Patient left: in chair;with chair alarm set;with family/visitor present;with call bell/phone within reach                   Time: 2330-0762 OT Time Calculation (min): 20 min Charges:  OT General Charges $OT Visit: 1 Visit OT Evaluation $OT Eval Low Complexity: 1 Low OT Treatments $Self Care/Home Management : 8-22 mins  Darleen Crocker, MS, OTR/L ,  CBIS ascom [phone]  08/13/21, 10:50 AM

## 2021-08-13 NOTE — Anesthesia Postprocedure Evaluation (Signed)
Anesthesia Post Note  Patient: Leroy Merritt  Procedure(s) Performed: COMPUTER ASSISTED TOTAL KNEE ARTHROPLASTY (Left: Knee)  Patient location during evaluation: Nursing Unit Anesthesia Type: Spinal Level of consciousness: awake and alert and oriented Pain management: pain level controlled Vital Signs Assessment: post-procedure vital signs reviewed and stable Respiratory status: respiratory function stable Cardiovascular status: stable Postop Assessment: no headache, no backache, patient able to bend at knees, no apparent nausea or vomiting, able to ambulate and adequate PO intake Anesthetic complications: no   No notable events documented.   Last Vitals:  Vitals:   08/12/21 2118 08/13/21 0512  BP: (!) 110/53 113/81  Pulse: 71 68  Resp: 17 17  Temp: 36.7 C 36.6 C  SpO2: 98% 99%    Last Pain:  Vitals:   08/12/21 2000  TempSrc:   PainSc: 0-No pain                 Lanora Manis

## 2021-08-13 NOTE — Discharge Summary (Signed)
Physician Discharge Summary  Patient ID: Leroy Merritt MRN: [id number] DOB/AGE: April 29, 1945 76 y.o.  Admit date: 08/12/2021 Discharge date: 08/13/2021  Admission Diagnoses:  Total knee replacement status [Z96.659]  Surgeries:Procedure(s):  Left total knee arthroplasty using computer-assisted navigation   SURGEON:  Marciano Sequin. M.D.   ASSISTANT: Cassell Smiles, PA-C (present and scrubbed throughout the case, critical for assistance with exposure, retraction, instrumentation, and closure)   ANESTHESIA: spinal   ESTIMATED BLOOD LOSS: 50 mL   FLUIDS REPLACED: 800 mL of crystalloid   TOURNIQUET TIME: 94 minutes   DRAINS: 2 medium Hemovac drains   SOFT TISSUE RELEASES: Anterior cruciate ligament, posterior cruciate ligament, deep and superficial medial collateral ligament, patellofemoral ligament   IMPLANTS UTILIZED: DePuy Attune size 7 posterior stabilized femoral component (cemented), size 8 rotating platform tibial component (cemented), 41 mm medialized dome patella (cemented), and a 6 mm stabilized rotating platform polyethylene insert.  Discharge Diagnoses: Patient Active Problem List   Diagnosis Date Noted   Total knee replacement status 08/12/2021   Hx of total hip arthroplasty, right 03/20/2021   Primary osteoarthritis of right hip 12/23/2020   First degree AV block 37/85/8850   Chronic systolic heart failure ([place]) 09/16/2018   ICD (implantable cardioverter-defibrillator) in place 09/16/2018   Ventricular tachycardia ([place]) 04/28/2017   Near syncope 04/28/2017   Dilated cardiomyopathy ([place]) 04/28/2017   Advanced care planning/counseling discussion 03/17/2017   Palpitations 03/17/2017   Reflux esophagitis    Stricture and stenosis of esophagus    BPH (benign prostatic hyperplasia) 12/20/2014   Varicose veins of both lower extremities 12/20/2014    Past Medical History:  Diagnosis Date   AICD (automatic cardioverter/defibrillator) present    Arthritis     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 b.) TTE 03/13/2020: Ao root 364m Asc. Ao 372m 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   Cataract    Dilated cardiomyopathy ([facility]  a.) LHC 04/28/2017: EF 35-35%. b.) TTE 04/29/2017: 30-35%. c.) TTE 03/22/2019: 50-55%. d.) TTE 03/13/2020: EF45-50%.   Diverticulosis 2017   Dysphagia 12/20/2014   Esophagitis    First degree AV block    GERD (gastroesophageal reflux disease)    HFrEF (heart failure with reduced ejection fraction) ([place]/20/2019   a.) TTE 04/29/2017: mod-sev LV dysfunction; EF 30-35%; global HK; G1DD. b.) Cardiac MRI 04/29/2017: biventricular/biatrial enlargement; LVEF 27%; global HK. c.) TTE 03/13/2020: EF 45-50%; global HK; mild LVH; GLS -12.7%; AoV sclerosis without stenosis; mild MR, triv TR; G1DD.   Hypercholesterolemia    ICD (implantable cardioverter-defibrillator) in place    a.) St. Judes device   NICM (nonischemic cardiomyopathy) ([facility]  a.) TTE 04/29/2017: EF 30-35%. b.) Cardiac MRI 04/29/2017: EF 27%.   Palpitations    Stricture and stenosis of esophagus    Urethral stricture 03/2021   a.) encountered intraoperatively in 03/2021 (hip surgery); required intraop urology consult (BrErlene QuanD) for foley catheter placement.   Varicose veins of both lower extremities 12/20/2014   Ventricular tachycardia ([facility]/19/2019   a.) s/p St. Jude dual-chamber ICD placement     Transfusion:    Consultants (if any):   Discharged Condition: Improved  Hospital Course: Leroy Merritt an 76 30o. male who was admitted 08/12/2021 with a diagnosis of left  knee osteoarthritis and went to the operating room on 08/12/2021 and underwent left total knee arthoplasty. The patient received perioperative antibiotics for prophylaxis (see  below). The patient tolerated the procedure well and was transported to PACU in stable condition. After meeting PACU criteria, the patient was subsequently transferred to the Orthopaedics/Rehabilitation unit.   The patient received DVT prophylaxis in the form of early mobilization, Lovenox, TED hose, and SCDs . A sacral pad had been placed and heels were elevated off of the bed with rolled towels in order to protect skin integrity. Foley catheter was discontinued on postoperative day #0. Wound drains were discontinued on postoperative day #1. The surgical incision was healing well without signs of infection.  Physical therapy was initiated postoperatively for transfers, gait training, and strengthening. Occupational therapy was initiated for activities of daily living and evaluation for assisted devices. Rehabilitation goals were reviewed in detail with the patient. The patient made steady progress with physical therapy and physical therapy recommended discharge to Home.   The patient achieved the preliminary goals of this hospitalization and was felt to be medically and orthopaedically appropriate for discharge.  He was given perioperative antibiotics:  Anti-infectives (From admission, onward)    Start     Dose/Rate Route Frequency Ordered Stop   08/12/21 2000  ceFAZolin (ANCEF) IVPB 2g/100 mL premix        2 g 200 mL/hr over 30 Minutes Intravenous Every 8 hours 08/12/21 1736 08/13/21 0415   08/12/21 0951  ceFAZolin (ANCEF) 2-4 GM/100ML-% IVPB       Note to Pharmacy: Herby Abraham W: cabinet override      08/12/21 0951 08/12/21 1217   08/12/21 0600  ceFAZolin (ANCEF) IVPB 2g/100 mL premix        2 g 200 mL/hr over 30 Minutes Intravenous On call to O.R. 08/12/21 0002 08/12/21 1210     .  Recent vital signs:  Vitals:   08/13/21 1251 08/13/21 1643  BP: 106/66 110/69  Pulse: 68 75  Resp: 16 15  Temp: 97.7 F (36.5 C) 97.9 F (36.6 C)  SpO2: 98% 99%    Recent laboratory studies:   No results for input(s): WBC, HGB, HCT, PLT, K, CL, CO2, BUN, CREATININE, GLUCOSE, CALCIUM, LABPT, INR in the last 72 hours.  Diagnostic Studies: DG Knee Left Port  Result Date: 08/12/2021 CLINICAL DATA:  Left knee replacement EXAM: PORTABLE LEFT KNEE - 1-2 VIEW COMPARISON:  None Available. FINDINGS: Postsurgical changes from left total knee arthroplasty. Arthroplasty components are in their expected alignment. No periprosthetic fracture or evidence of other complication. Expected postoperative changes within the overlying soft tissues with surgical drains in the suprapatellar region. IMPRESSION: Expected postoperative changes from left total knee arthroplasty. Electronically Signed   By: Davina Poke D.O.   On: 08/12/2021 15:56    Discharge Medications:   Allergies as of 08/13/2021   No Known Allergies      Medication List     TAKE these medications    celecoxib 200 MG capsule Commonly known as: CELEBREX Take 1 capsule (200 mg total) by mouth 2 (two) times daily.   clobetasol ointment 0.05 % Commonly known as: TEMOVATE Apply 1 application topically 2 (two) times daily. What changed:  when to take this additional instructions   diphenhydramine-acetaminophen 25-500 MG Tabs tablet Commonly known as: TYLENOL PM Take 1 tablet by mouth at bedtime as needed (sleep).   enoxaparin 40 MG/0.4ML injection Commonly known as: LOVENOX Inject 0.4 mLs (40 mg total) into the skin  daily for 14 days.   Entresto 49-51 MG Generic drug: sacubitril-valsartan TAKE ONE (1) TABLET BY MOUTH TWO TIMES PER DAY   eplerenone 25 MG tablet Commonly known as: INSPRA TAKE ONE TABLET BY MOUTH DAILY What changed:  how to take this when to take this   finasteride 5 MG tablet Commonly known as: PROSCAR TAKE 1 TABLET AT BEDTIME   MULTIVITAMIN ADULT PO Take 1 tablet by mouth every morning.   omeprazole 20 MG capsule Commonly known as: PRILOSEC Take 1 capsule (20 mg total) by mouth daily. What  changed: when to take this   oxyCODONE 5 MG immediate release tablet Commonly known as: Oxy IR/ROXICODONE Take 1 tablet (5 mg total) by mouth every 4 (four) hours as needed for severe pain.   sotalol 80 MG tablet Commonly known as: BETAPACE Take 1 tablet (80 mg total) by mouth 2 (two) times daily.   tamsulosin 0.4 MG Caps capsule Commonly known as: FLOMAX TAKE 1 CAPSULE EVERY DAY What changed:  how to take this when to take this   traMADol 50 MG tablet Commonly known as: ULTRAM Take 1 tablet (50 mg total) by mouth every 4 (four) hours as needed for moderate pain.               Durable Medical Equipment  (From admission, onward)           Start     Ordered   08/12/21 1737  DME Walker rolling  Once       Question:  Patient needs a walker to treat with the following condition  Answer:  Total knee replacement status   08/12/21 1736   08/12/21 1737  DME Bedside commode  Once       Question:  Patient needs a bedside commode to treat with the following condition  Answer:  Total knee replacement status   08/12/21 1736            Disposition: Home with home health PT     Follow-up Information     Fausto Skillern, PA-C Follow up on 08/27/2021.   Specialty: Orthopedic Surgery Why: at 10:45am Contact information: [place] [address] [phone]         Dereck Leep, MD Follow up on 09/24/2021.   Specialty: Orthopedic Surgery Why: at 2:15pm Contact information: [place]  [id number] [place], PA-C 08/13/2021, 4:52 PM

## 2021-08-13 NOTE — Progress Notes (Signed)
1605 Hemovac removed. Cath tip intact 15m of serosanguinous output noted. 4x4 gauze and tegaderm applied to site where drain was removed. X2 honeycomb dressings given to pt for home. TED hose applied to LLE. Honey comb scant amount of old drainage.   1715 D/c instructions reviewed with pt and wife. IV removed all questions and concerns answered.

## 2021-08-13 NOTE — Evaluation (Signed)
Physical Therapy Evaluation Patient Details Name: Leroy Merritt MRN: [id number] DOB: [birth year] Today's Date: 08/13/2021  History of Present Illness  Pt is a 76 yo M diagnosed with degenerative arthrosis of the left knee and is s/p elective L TKA. 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   Clinical Impression  Pt was pleasant and motivated to participate during the session and put forth good effort throughout. Pt required no physical assistance with functional tasks and demonstrated good control and stability with transfers and gait.  Pt reported no adverse symptoms during the session other than mild L knee pain with SpO2 and HR WNL on room air.  Pt will benefit from HHPT upon discharge to safely address deficits listed in patient problem list for decreased caregiver assistance and eventual return to PLOF.        Recommendations for follow up therapy are one component of a multi-disciplinary discharge planning process, led by the attending physician.  Recommendations may be updated based on patient status, additional functional criteria and insurance authorization.  Follow Up Recommendations Home health PT    Assistance Recommended at Discharge Intermittent Supervision/Assistance  Patient can return home with the following  A little help with walking and/or transfers;A little help with bathing/dressing/bathroom;Assistance with cooking/housework;Help with stairs or ramp for entrance;Assist for transportation    Equipment Recommendations None recommended by PT  Recommendations for Other Services       Functional Status Assessment Patient has had a recent decline in their functional status and demonstrates the ability to make significant improvements in function in a reasonable and predictable amount of time.     Precautions / Restrictions Precautions Precautions: Knee Precaution Booklet Issued: No Restrictions Weight Bearing Restrictions: Yes LLE  Weight Bearing: Weight bearing as tolerated Other Position/Activity Restrictions: Pt able to perform Ind LLE SLR without extensor lag, no KI required      Mobility  Bed Mobility Overal bed mobility: Modified Independent             General bed mobility comments: Min increased time and effort only    Transfers Overall transfer level: Needs assistance   Transfers: Sit to/from Stand Sit to Stand: Min guard           General transfer comment: Min verbal cues for sequencing, good eccentric and concentric control and stability    Ambulation/Gait Ambulation/Gait assistance: Min guard Gait Distance (Feet): 150 Feet Assistive device: Rolling walker (2 wheels) Gait Pattern/deviations: Step-through pattern, Decreased step length - right, Decreased stance time - left, Antalgic Gait velocity: decreased     General Gait Details: Pt minimally antalgic on the LLE but was steady throughout with SpO2 and HR WNL on room air  Stairs            Wheelchair Mobility    Modified Rankin (Stroke Patients Only)       Balance Overall balance assessment: Needs assistance Sitting-balance support: Feet unsupported Sitting balance-Leahy Scale: Good     Standing balance support: Bilateral upper extremity supported, During functional activity Standing balance-Leahy Scale: Good                               Pertinent Vitals/Pain Pain Assessment Pain Assessment: 0-10 Pain Score: 2  Pain Location: L knee Pain Descriptors / Indicators: Discomfort, Aching Pain Intervention(s): Repositioned, Premedicated before session, Monitored during session, Ice applied    Home Living Family/patient expects to be discharged to::  Private residence Living Arrangements: Spouse/significant other Available Help at Discharge: Family;Available 24 hours/day Type of Home: House Home Access: Stairs to enter Entrance Stairs-Rails: Can reach both;Right;Left Entrance Stairs-Number of Steps:  5   Home Layout: Able to live on main level with bedroom/bathroom Home Equipment: Rolling Walker (2 wheels);Cane - single point;BSC/3in1      Prior Function Prior Level of Function : Independent/Modified Independent             Mobility Comments: Ind amb without an AD for the last 3 months after R THA, no fall history, community ambulator ADLs Comments: Ind with ADLs     Hand Dominance   Dominant Hand: Right    Extremity/Trunk Assessment   Upper Extremity Assessment Upper Extremity Assessment: Overall WFL for tasks assessed    Lower Extremity Assessment Lower Extremity Assessment: Generalized weakness;LLE deficits/detail LLE Deficits / Details: LLE hip flex >/= 3/5 with pt able to perform Ind SLR without extensor lag LLE: Unable to fully assess due to pain LLE Sensation: WNL       Communication   Communication: No difficulties  Cognition Arousal/Alertness: Awake/alert Behavior During Therapy: WFL for tasks assessed/performed Overall Cognitive Status: Within Functional Limits for tasks assessed                                          General Comments      Exercises Total Joint Exercises Ankle Circles/Pumps: AROM, Strengthening, Both, 10 reps Quad Sets: AROM, Strengthening, Left, 5 reps, 10 reps Straight Leg Raises: Strengthening, Left, 5 reps Long Arc Quad: AROM, Strengthening, Left, 10 reps, 15 reps Knee Flexion: AROM, Strengthening, Left, 10 reps, 15 reps Goniometric ROM: L knee AROM: 8-87 deg Other Exercises Other Exercises: Positioning education to promote L knee ext PROM and prevent heel pressure Other Exercises: 90 deg L turn training to prevent CKC twisting on the L knee Other Exercises: HEP education/review per handout   Assessment/Plan    PT Assessment Patient needs continued PT services  PT Problem List Decreased strength;Decreased range of motion;Decreased activity tolerance;Decreased balance;Decreased mobility;Decreased  knowledge of use of DME;Pain       PT Treatment Interventions DME instruction;Gait training;Stair training;Functional mobility training;Therapeutic activities;Therapeutic exercise;Balance training;Patient/family education    PT Goals (Current goals can be found in the Care Plan section)  Acute Rehab PT Goals Patient Stated Goal: To be able to mow the yard PT Goal Formulation: With patient Time For Goal Achievement: 08/26/21 Potential to Achieve Goals: Good    Frequency BID     Co-evaluation               AM-PAC PT "6 Clicks" Mobility  Outcome Measure Help needed turning from your back to your side while in a flat bed without using bedrails?: A Little Help needed moving from lying on your back to sitting on the side of a flat bed without using bedrails?: A Little Help needed moving to and from a bed to a chair (including a wheelchair)?: A Little Help needed standing up from a chair using your arms (e.g., wheelchair or bedside chair)?: A Little Help needed to walk in hospital room?: A Little Help needed climbing 3-5 steps with a railing? : A Little 6 Click Score: 18    End of Session Equipment Utilized During Treatment: Gait belt Activity Tolerance: Patient tolerated treatment well Patient left: in chair;with call bell/phone within reach;with chair alarm  set;with family/visitor present;with SCD's reapplied;Other (comment) (polar care to L knee) Nurse Communication: Mobility status;Weight bearing status PT Visit Diagnosis: Other abnormalities of gait and mobility (R26.89);Muscle weakness (generalized) (M62.81);Pain Pain - Right/Left: Left Pain - part of body: Knee    Time: 3329-5188 PT Time Calculation (min) (ACUTE ONLY): 33 min   Charges:   PT Evaluation $PT Eval Moderate Complexity: 1 Mod PT Treatments $Therapeutic Exercise: 8-22 mins       D. Scott Jonnathan Birman PT, DPT 08/13/21, 11:50 AM

## 2021-08-13 NOTE — Progress Notes (Signed)
Met with the patient in the room at the bedside The patient lives at Home with *his wife The patient  currently has DME including, RW, 3 in 1, cane, shower seat The patient will not need additional DME They have transportation with his wife They can afford their medication  They are set up with [facility] for Home health services   Admitted for: knee replacement  

## 2021-08-14 ENCOUNTER — Telehealth: Payer: Self-pay | Admitting: *Deleted

## 2021-08-14 NOTE — Telephone Encounter (Signed)
Transition Care Management Follow-up Telephone Call Date of discharge and from where:   [facility] How have you been since you were released from the hospital?   Feeling great some pain tolerable very pleased with how things are going Any questions or concerns? No  Items Reviewed: Did the pt receive and understand the discharge instructions provided? Yes  Medications obtained and verified? Yes  Other? No  Any new allergies since your discharge? No  Dietary orders reviewed? No Do you have support at home? Yes   Home Care and Equipment/Supplies: Were home health services ordered? not applicable If so, what is the name of the agency?   Has the agency set up a time to come to the patient's home?  Were any new equipment or medical supplies ordered?   What is the name of the medical supply agency?  Were you able to get the supplies/equipment?  Do you have any questions related to the use of the equipment or supplies?   Functional Questionnaire: (I = Independent and D = Dependent) ADLs: I  Bathing/Dressing- I  Meal Prep- I  Eating- I  Maintaining continence- I  Transferring/Ambulation- I/ some help  Managing Meds- I  Follow up appointments reviewed:  PCP Hospital f/u appt confirmed?  Scheduled to see  on  @ . [facility] f/u appt confirmed? Yes 08-25-2021 @ 10:45 Tamala Julian- 09-24-2021 @ 2:15 [place]  Are transportation arrangements needed? no If their condition worsens, is the pt aware to call PCP or go to the Emergency Dept.? Yes Was the patient provided with contact information for the PCP's office or ED? Yes Was to pt encouraged to call back with questions or concerns? Yes

## 2021-08-23 ENCOUNTER — Ambulatory Visit (INDEPENDENT_AMBULATORY_CARE_PROVIDER_SITE_OTHER): Payer: Medicare HMO

## 2021-08-23 DIAGNOSIS — I440 Atrioventricular block, first degree: Secondary | ICD-10-CM

## 2021-08-24 LAB — CUP PACEART REMOTE DEVICE CHECK
Battery Remaining Longevity: 55 mo
Battery Remaining Percentage: 57 %
Battery Voltage: 2.96 V
Brady Statistic AP VP Percent: 1 %
Brady Statistic AP VS Percent: 3.5 %
Brady Statistic AS VP Percent: 2.7 %
Brady Statistic AS VS Percent: 93 %
Brady Statistic RA Percent Paced: 2.5 %
Brady Statistic RV Percent Paced: 3.1 %
Date Time Interrogation Session: 20230616040025
HighPow Impedance: 77 Ohm
HighPow Impedance: 77 Ohm
Implantable Lead Implant Date: 20190220
Implantable Lead Implant Date: 20190220
Implantable Lead Location: 753859
Implantable Lead Location: 753860
Implantable Lead Model: 7122
Implantable Pulse Generator Implant Date: 20190220
Lead Channel Impedance Value: 340 Ohm
Lead Channel Impedance Value: 440 Ohm
Lead Channel Pacing Threshold Amplitude: 0.75 V
Lead Channel Pacing Threshold Amplitude: 1 V
Lead Channel Pacing Threshold Pulse Width: 0.5 ms
Lead Channel Pacing Threshold Pulse Width: 0.5 ms
Lead Channel Sensing Intrinsic Amplitude: 1.1 mV
Lead Channel Sensing Intrinsic Amplitude: 11.8 mV
Lead Channel Setting Pacing Amplitude: 2 V
Lead Channel Setting Pacing Amplitude: 2.5 V
Lead Channel Setting Pacing Pulse Width: 0.5 ms
Lead Channel Setting Sensing Sensitivity: 0.5 mV
Pulse Gen Serial Number: 9787528

## 2021-08-27 DIAGNOSIS — Z96652 Presence of left artificial knee joint: Secondary | ICD-10-CM | POA: Diagnosis not present

## 2021-08-27 DIAGNOSIS — M25562 Pain in left knee: Secondary | ICD-10-CM | POA: Diagnosis not present

## 2021-08-29 DIAGNOSIS — Z96652 Presence of left artificial knee joint: Secondary | ICD-10-CM | POA: Diagnosis not present

## 2021-08-29 DIAGNOSIS — M25562 Pain in left knee: Secondary | ICD-10-CM | POA: Diagnosis not present

## 2021-08-29 NOTE — Progress Notes (Signed)
Remote ICD transmission.   

## 2021-09-03 ENCOUNTER — Encounter: Payer: Self-pay | Admitting: Internal Medicine

## 2021-09-03 ENCOUNTER — Ambulatory Visit (INDEPENDENT_AMBULATORY_CARE_PROVIDER_SITE_OTHER): Payer: Medicare HMO | Admitting: Internal Medicine

## 2021-09-03 VITALS — BP 118/78 | HR 69 | Ht 70.0 in | Wt 179.6 lb

## 2021-09-03 DIAGNOSIS — I440 Atrioventricular block, first degree: Secondary | ICD-10-CM

## 2021-09-03 DIAGNOSIS — I428 Other cardiomyopathies: Secondary | ICD-10-CM | POA: Diagnosis not present

## 2021-09-03 DIAGNOSIS — M25562 Pain in left knee: Secondary | ICD-10-CM | POA: Diagnosis not present

## 2021-09-03 DIAGNOSIS — Z95810 Presence of automatic (implantable) cardiac defibrillator: Secondary | ICD-10-CM | POA: Diagnosis not present

## 2021-09-03 DIAGNOSIS — Z96652 Presence of left artificial knee joint: Secondary | ICD-10-CM | POA: Diagnosis not present

## 2021-09-03 DIAGNOSIS — I5022 Chronic systolic (congestive) heart failure: Secondary | ICD-10-CM

## 2021-09-03 DIAGNOSIS — Z79899 Other long term (current) drug therapy: Secondary | ICD-10-CM

## 2021-09-03 DIAGNOSIS — I472 Ventricular tachycardia, unspecified: Secondary | ICD-10-CM | POA: Diagnosis not present

## 2021-09-03 LAB — PACEMAKER DEVICE OBSERVATION

## 2021-09-03 NOTE — Progress Notes (Signed)
Patient Care Team: Dorcas Carrow, DO as PCP - General (Family Medicine) Antonieta Iba, MD as PCP - Cardiology (Cardiology) Duke Salvia, MD as PCP - Electrophysiology (Cardiology) Antonieta Iba, MD as Consulting Physician (Cardiology) Duke Salvia, MD as Consulting Physician (Cardiology)   HPI  Leroy Merritt is a 76 y.o. 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 No genetic testing; noted to have significant LV dysfunction and mild RV enlargement.  Interval improvement in LV function (See Below)    Seen initially 7/20 with complaints of fatigue.  Sotalol dose was decreased from 160--80; also started on transition to [facility] and addition of spironolactone- >>> eplerenone  2/2 gynecomastia   The patient denies chest pain, shortness of breath, nocturnal dyspnea, orthopnea or peripheral edema.  There have been no palpitations, lightheadedness or syncope.    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 b.) TTE 03/13/2020: Ao root 39mm; Asc. 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   Cataract    Dilated cardiomyopathy (HCC)    a.) LHC 04/28/2017: EF  35-35%. b.) TTE 04/29/2017: 30-35%. c.) TTE 03/22/2019: 50-55%. d.) TTE 03/13/2020: EF45-50%.   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 b.) Cardiac MRI 04/29/2017: biventricular/biatrial enlargement; LVEF 27%; global HK. c.) TTE 03/13/2020: EF 45-50%; global HK; mild LVH; GLS -12.7%; AoV sclerosis without stenosis; mild MR, triv TR; G1DD.   Hypercholesterolemia    ICD (implantable cardioverter-defibrillator) in place    a.) St. Judes device   NICM (nonischemic cardiomyopathy) (HCC)    a.) TTE 04/29/2017: EF 30-35%. b.) Cardiac MRI 04/29/2017: EF 27%.   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 Jude dual-chamber ICD placement    Past Surgical History:  Procedure Laterality Date   CARDIAC CATHETERIZATION  04/28/2017   CATARACT EXTRACTION W/ INTRAOCULAR LENS IMPLANT Right 2017   CATARACT EXTRACTION W/PHACO Left 05/20/2019   Procedure: CATARACT  EXTRACTION PHACO AND INTRAOCULAR LENS PLACEMENT (IOC) LEFT;  Surgeon: Galen Manila, MD;  Location: [facility] ORS;  Service: Ophthalmology;  Laterality: Left;  [place] 01:19.7 CDE 14.58 Fluid Pack Lot # [id number]   COLONOSCOPY     ESOPHAGOGASTRODUODENOSCOPY (EGD) WITH PROPOFOL N/A 02/19/2015   Procedure: ESOPHAGOGASTRODUODENOSCOPY (EGD) WITH PROPOFOL and esophageal dilation.;  Surgeon: Midge Minium, MD;  Location: [facility];  Service: Endoscopy;  Laterality: N/A;   EYE SURGERY     ICD IMPLANT N/A 04/29/2017   Procedure: ICD IMPLANT;   Surgeon: Marinus Maw, MD;  Location: [facility] INVASIVE CV LAB;  Service: Cardiovascular;  Laterality: N/A;   KNEE ARTHROPLASTY Left 08/12/2021   Procedure: COMPUTER ASSISTED TOTAL KNEE ARTHROPLASTY;  Surgeon: Donato Heinz, MD;  Location: [facility] ORS;  Service: Orthopedics;  Laterality: Left;   LEFT HEART CATH AND CORONARY ANGIOGRAPHY N/A 04/28/2017   Procedure: LEFT HEART CATH AND CORONARY ANGIOGRAPHY;  Surgeon: [place], Peter M, MD;  Location: [facility] INVASIVE CV LAB;  Service: Cardiovascular;  Laterality: N/A;   PROSTATE BIOPSY  ~ 2012   RETINAL DETACHMENT SURGERY Right 1990s   "torn retina"   TOTAL HIP ARTHROPLASTY Right 03/20/2021   Procedure: TOTAL HIP ARTHROPLASTY;  Surgeon: Donato Heinz, MD;  Location: [facility] ORS;  Service: Orthopedics;  Laterality: Right;    Current Meds  Medication Sig   clobetasol ointment (TEMOVATE) 0.05 % Apply 1 application topically 2 (two) times daily. (Patient taking differently: Apply 1 application  topically daily. Apply to penis)   diphenhydramine-acetaminophen (TYLENOL PM) 25-500 MG TABS tablet Take 1 tablet by mouth at bedtime as needed (sleep).   ENTRESTO 49-51 MG TAKE ONE (1) TABLET BY MOUTH TWO TIMES PER DAY   eplerenone (INSPRA) 25 MG tablet TAKE ONE TABLET BY MOUTH DAILY (Patient taking differently: 25 mg every morning.)   finasteride (PROSCAR) 5 MG tablet TAKE 1 TABLET AT BEDTIME   omeprazole (PRILOSEC) 20 MG capsule Take 1 capsule (20 mg total) by mouth daily. (Patient taking differently: Take 20 mg by mouth every morning.)   sotalol (BETAPACE) 80 MG tablet Take 1 tablet (80 mg total) by mouth 2 (two) times daily.   tamsulosin (FLOMAX) 0.4 MG CAPS capsule TAKE 1 CAPSULE EVERY DAY (Patient taking differently: 0.4 mg daily after supper.)    No Known Allergies    Review of Systems negative except from HPI and PMH  Physical Exam BP 118/78   Pulse 69   Ht 5\' 10"  (1.778 m)   Wt 179 lb 9.6 oz (81.5 kg)   SpO2 96%   BMI 25.77 kg/m   Well developed and  well nourished in no acute distress HENT normal Neck supple with JVP-flat Clear Device pocket well healed; without hematoma or erythema.  There is no tethering  Regular rate and rhythm, no murmur Abd-soft with active BS No Clubbing cyanosis  edema Skin-warm and dry A & Oriented  Grossly normal sensory and motor function  ECG sinus at 69 Intervals 21/09/41 Axis left -51 PVC-isolated  Assessment and  Plan  Nonischemic cardiomyopathy    Ventricular tachycardia--PVCs frequent   Implantable defibrillator-secondary prevention  St Jude   Orthostatic intolerance  High Risk Medication Surveillance sotalol., aldactone  Fatigue  First-degree AV block  PVC burden remains low.  We will continue on sotalol 80 mg twice daily.  Last potassium was in range.  However, his magnesium was not assessed.  We will do so today.  Tolerating his medications well.  However, the cost of Sherryll Burger is significant.  We reviewed the data vis--vis paradigm HF and then discussed the possibility of getting his Entresto from [place].  Euvolemic.  Continue his eplerenone 25.       Current medicines are reviewed at length with the patient today .  The patient does not  have concerns regarding medicines.

## 2021-09-05 DIAGNOSIS — Z96652 Presence of left artificial knee joint: Secondary | ICD-10-CM | POA: Diagnosis not present

## 2021-09-05 DIAGNOSIS — M25562 Pain in left knee: Secondary | ICD-10-CM | POA: Diagnosis not present

## 2021-09-11 ENCOUNTER — Other Ambulatory Visit
Admission: RE | Admit: 2021-09-11 | Discharge: 2021-09-11 | Disposition: A | Discharge status: Home / Self Care | Payer: Medicare HMO | Attending: Internal Medicine | Admitting: Internal Medicine

## 2021-09-11 DIAGNOSIS — Z471 Aftercare following joint replacement surgery: Secondary | ICD-10-CM | POA: Diagnosis not present

## 2021-09-11 DIAGNOSIS — I428 Other cardiomyopathies: Secondary | ICD-10-CM | POA: Diagnosis not present

## 2021-09-11 DIAGNOSIS — Z79899 Other long term (current) drug therapy: Secondary | ICD-10-CM

## 2021-09-11 DIAGNOSIS — I472 Ventricular tachycardia, unspecified: Secondary | ICD-10-CM | POA: Diagnosis not present

## 2021-09-11 LAB — MAGNESIUM: Magnesium: 2.2 mg/dL (ref 1.7–2.4)

## 2021-09-19 LAB — CUP PACEART INCLINIC DEVICE CHECK
Battery Remaining Longevity: 58 mo
Brady Statistic RA Percent Paced: 2.4 %
Brady Statistic RV Percent Paced: 3 %
Date Time Interrogation Session: 20230627113500
HighPow Impedance: 78.75 Ohm
Implantable Lead Implant Date: 20190220
Implantable Lead Implant Date: 20190220
Implantable Lead Location: 753859
Implantable Lead Location: 753860
Implantable Lead Model: 7122
Implantable Pulse Generator Implant Date: 20190220
Lead Channel Impedance Value: 350 Ohm
Lead Channel Impedance Value: 387.5 Ohm
Lead Channel Pacing Threshold Amplitude: 0.75 V
Lead Channel Pacing Threshold Amplitude: 0.75 V
Lead Channel Pacing Threshold Amplitude: 1 V
Lead Channel Pacing Threshold Amplitude: 1 V
Lead Channel Pacing Threshold Pulse Width: 0.5 ms
Lead Channel Pacing Threshold Pulse Width: 0.5 ms
Lead Channel Pacing Threshold Pulse Width: 0.5 ms
Lead Channel Pacing Threshold Pulse Width: 0.5 ms
Lead Channel Sensing Intrinsic Amplitude: 1.5 mV
Lead Channel Sensing Intrinsic Amplitude: 11.8 mV
Lead Channel Setting Pacing Amplitude: 2 V
Lead Channel Setting Pacing Amplitude: 2.5 V
Lead Channel Setting Pacing Pulse Width: 0.5 ms
Lead Channel Setting Sensing Sensitivity: 0.5 mV
Pulse Gen Serial Number: 9787528

## 2021-09-20 ENCOUNTER — Encounter: Payer: Self-pay | Admitting: Internal Medicine

## 2021-09-24 DIAGNOSIS — Z96652 Presence of left artificial knee joint: Secondary | ICD-10-CM | POA: Diagnosis not present

## 2021-10-05 ENCOUNTER — Other Ambulatory Visit: Payer: Self-pay | Admitting: Internal Medicine

## 2021-10-07 ENCOUNTER — Other Ambulatory Visit: Payer: Self-pay | Admitting: Cardiovascular Disease

## 2021-10-07 DIAGNOSIS — I428 Other cardiomyopathies: Secondary | ICD-10-CM

## 2021-10-07 DIAGNOSIS — I5022 Chronic systolic (congestive) heart failure: Secondary | ICD-10-CM

## 2021-10-08 NOTE — Telephone Encounter (Signed)
Attempted to schedule.  LMOV to call office.  ° °

## 2021-10-08 NOTE — Telephone Encounter (Signed)
Please contact pt for future appointment. Pt overdue for 12 month f/u with TG.

## 2021-10-14 ENCOUNTER — Telehealth: Payer: Self-pay | Admitting: Cardiovascular Disease

## 2021-10-14 NOTE — Telephone Encounter (Signed)
Attempted to schedule. Lmov  

## 2021-10-14 NOTE — Telephone Encounter (Signed)
Pt states he is returning a call

## 2021-10-15 NOTE — Telephone Encounter (Signed)
Late entry- I called and spoke with the patient.  I advised him that I had not tried to call him, nor could I see any notes where anyone else had tried to reach out.  Per the patient, he was contacted last week about setting up his follow up with Dr. Rockey Situ for October, which he has done. He wonders if he was receiving another call about scheduling the appointment.  I advised the patient that since his appointment is already set for October, unless someone tries to call him again, nothing further should be needed at this time.

## 2021-11-22 ENCOUNTER — Ambulatory Visit (INDEPENDENT_AMBULATORY_CARE_PROVIDER_SITE_OTHER): Payer: Medicare HMO

## 2021-11-22 DIAGNOSIS — I428 Other cardiomyopathies: Secondary | ICD-10-CM | POA: Diagnosis not present

## 2021-11-22 LAB — CUP PACEART REMOTE DEVICE CHECK
Battery Remaining Longevity: 52 mo
Battery Remaining Percentage: 54 %
Battery Voltage: 2.96 V
Brady Statistic AP VP Percent: 1 %
Brady Statistic AP VS Percent: 3.2 %
Brady Statistic AS VP Percent: 2.4 %
Brady Statistic AS VS Percent: 93 %
Brady Statistic RA Percent Paced: 2.4 %
Brady Statistic RV Percent Paced: 2.9 %
Date Time Interrogation Session: 20230915040016
HighPow Impedance: 80 Ohm
HighPow Impedance: 80 Ohm
Implantable Lead Implant Date: 20190220
Implantable Lead Implant Date: 20190220
Implantable Lead Location: 753859
Implantable Lead Location: 753860
Implantable Lead Model: 7122
Implantable Pulse Generator Implant Date: 20190220
Lead Channel Impedance Value: 340 Ohm
Lead Channel Impedance Value: 400 Ohm
Lead Channel Pacing Threshold Amplitude: 0.75 V
Lead Channel Pacing Threshold Amplitude: 1 V
Lead Channel Pacing Threshold Pulse Width: 0.5 ms
Lead Channel Pacing Threshold Pulse Width: 0.5 ms
Lead Channel Sensing Intrinsic Amplitude: 1.2 mV
Lead Channel Sensing Intrinsic Amplitude: 11.6 mV
Lead Channel Setting Pacing Amplitude: 2 V
Lead Channel Setting Pacing Amplitude: 2.5 V
Lead Channel Setting Pacing Pulse Width: 0.5 ms
Lead Channel Setting Sensing Sensitivity: 0.5 mV
Pulse Gen Serial Number: 9787528

## 2021-12-03 NOTE — Progress Notes (Signed)
Remote ICD transmission.   

## 2021-12-19 ENCOUNTER — Ambulatory Visit (INDEPENDENT_AMBULATORY_CARE_PROVIDER_SITE_OTHER): Payer: Medicare HMO | Admitting: *Deleted

## 2021-12-19 DIAGNOSIS — Z0000 Encounter for general adult medical examination without abnormal findings: Secondary | ICD-10-CM | POA: Diagnosis not present

## 2021-12-19 NOTE — Progress Notes (Signed)
Subjective:   Leroy Merritt is a 76 y.o. male who presents for Medicare Annual/Subsequent preventive examination.  I connected with  Leroy Merritt on 12/19/21 by a telephone enabled telemedicine application and verified that I am speaking with the correct person using two identifiers.   I discussed the limitations of evaluation and management by telemedicine. The patient expressed understanding and agreed to proceed.  Patient location: home  Provider location: Tele-health-home    Review of Systems     Cardiac Risk Factors include: advanced age (>54mn, >>29women);male gender;family history of premature cardiovascular disease     Objective:    Today's Vitals   There is no height or weight on file to calculate BMI.     12/19/2021    9:34 AM 08/12/2021    5:57 PM 08/12/2021    9:43 AM 07/31/2021   11:56 AM 03/20/2021    6:25 AM 01/29/2021   11:32 AM 08/22/2019   10:17 AM  Advanced Directives  Does Patient Have a Medical Advance Directive? Yes No No;Yes Yes Yes No No  Type of AMaterials engineerof AFrontier Oil CorporationPower of [place] will    Does patient want to make changes to medical advance directive?   No - Patient declined  No - Patient declined    Copy of [place]? No - copy requested  No - copy requested  No - copy requested    Would patient like information on creating a medical advance directive?  No - Patient declined     Yes (MAU/Ambulatory/Procedural Areas - Information given)    Current Medications (verified) Outpatient Encounter Medications as of 12/19/2021  Medication Sig   clobetasol ointment (TEMOVATE) 01.61% Apply 1 application topically 2 (two) times daily. (Patient taking differently: Apply 1 application  topically daily. Apply to penis)   diphenhydramine-acetaminophen (TYLENOL PM) 25-500 MG TABS tablet Take 1 tablet by mouth at bedtime as needed (sleep).   ENTRESTO 49-51  MG TAKE ONE (1) TABLET BY MOUTH TWO TIMES PER DAY   eplerenone (INSPRA) 25 MG tablet TAKE ONE TABLET BY MOUTH DAILY   finasteride (PROSCAR) 5 MG tablet TAKE 1 TABLET AT BEDTIME   omeprazole (PRILOSEC) 20 MG capsule Take 1 capsule (20 mg total) by mouth daily. (Patient taking differently: Take 20 mg by mouth every morning.)   sotalol (BETAPACE) 80 MG tablet Take 1 tablet (80 mg total) by mouth 2 (two) times daily.   tamsulosin (FLOMAX) 0.4 MG CAPS capsule TAKE 1 CAPSULE EVERY DAY (Patient taking differently: 0.4 mg daily after supper.)   oxyCODONE (OXY IR/ROXICODONE) 5 MG immediate release tablet Take 1 tablet (5 mg total) by mouth every 4 (four) hours as needed for severe pain.   traMADol (ULTRAM) 50 MG tablet Take 1 tablet (50 mg total) by mouth every 4 (four) hours as needed for moderate pain.   No facility-administered encounter medications on file as of 12/19/2021.    Allergies (verified) Patient has no known allergies.   History: Past Medical History:  Diagnosis Date   AICD (automatic cardioverter/defibrillator) present    Arthritis    Ascending aorta dilation ([place] 03/22/2019   a.) TTE 03/22/2019: Ao root measured 361m b.) TTE 03/13/2020: Ao root 3933mAsc. Ao 61m60mAV dissociation 04/28/2017   BPH (benign prostatic hyperplasia)    CAD (coronary artery disease)    a.) LHC 04/28/2017: severe LV dysfunction with EF 25-30%; global HK, LVEDP  14 mmHg; no obstructive CAD.   Cataract    Dilated cardiomyopathy ([place])    a.) LHC 04/28/2017: EF 35-35%. b.) TTE 04/29/2017: 30-35%. c.) TTE 03/22/2019: 50-55%. d.) TTE 03/13/2020: EF45-50%.   Diverticulosis 2017   Dysphagia 12/20/2014   Esophagitis    First degree AV block    GERD (gastroesophageal reflux disease)    HFrEF (heart failure with reduced ejection fraction) ([place]) 04/29/2017   a.) TTE 04/29/2017: mod-sev LV dysfunction; EF 30-35%; global HK; G1DD. b.) Cardiac MRI 04/29/2017: biventricular/biatrial enlargement; LVEF 27%; global  HK. c.) TTE 03/13/2020: EF 45-50%; global HK; mild LVH; GLS -12.7%; AoV sclerosis without stenosis; mild MR, triv TR; G1DD.   Hypercholesterolemia    ICD (implantable cardioverter-defibrillator) in place    a.) St. Judes device   NICM (nonischemic cardiomyopathy) ([place])    a.) TTE 04/29/2017: EF 30-35%. b.) Cardiac MRI 04/29/2017: EF 27%.   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 Jude dual-chamber ICD placement   Past Surgical History:  Procedure Laterality Date   CARDIAC CATHETERIZATION  04/28/2017   CATARACT EXTRACTION W/ INTRAOCULAR LENS IMPLANT Right 2017   CATARACT EXTRACTION W/PHACO Left 05/20/2019   Procedure: CATARACT EXTRACTION PHACO AND INTRAOCULAR LENS PLACEMENT ([place]) LEFT;  Surgeon: Birder Robson, MD;  Location: [facility] ORS;  Service: Ophthalmology;  Laterality: Left;  [place] 01:19.7 CDE 14.58 Fluid Pack Lot # [id number]   COLONOSCOPY     ESOPHAGOGASTRODUODENOSCOPY (EGD) WITH PROPOFOL N/A 02/19/2015   Procedure: ESOPHAGOGASTRODUODENOSCOPY (EGD) WITH PROPOFOL and esophageal dilation.;  Surgeon: Lucilla Lame, MD;  Location: [place];  Service: Endoscopy;  Laterality: N/A;   EYE SURGERY     ICD IMPLANT N/A 04/29/2017   Procedure: ICD IMPLANT;  Surgeon: Evans Lance, MD;  Location: [place] CV LAB;  Service: Cardiovascular;  Laterality: N/A;   KNEE ARTHROPLASTY Left 08/12/2021   Procedure: COMPUTER ASSISTED TOTAL KNEE ARTHROPLASTY;  Surgeon: Dereck Leep, MD;  Location: [facility] ORS;  Service: Orthopedics;  Laterality: Left;   LEFT HEART CATH AND CORONARY ANGIOGRAPHY N/A 04/28/2017   Procedure: LEFT HEART CATH AND CORONARY ANGIOGRAPHY;  Surgeon: Martin[place], Peter M, MD;  Location: [place] CV LAB;  Service: Cardiovascular;  Laterality: N/A;   PROSTATE BIOPSY  ~ 2012   RETINAL DETACHMENT SURGERY Right 1990s   "torn retina"   TOTAL HIP ARTHROPLASTY Right 03/20/2021   Procedure: TOTAL HIP ARTHROPLASTY;  Surgeon: Dereck Leep, MD;  Location: [facility] ORS;  Service: Orthopedics;  Laterality: Right;   Family History  Problem Relation Age of Onset   Arthritis Mother    Diabetes Father    Hypertension Father    Heart disease Brother    Hyperlipidemia Brother    Hypertension Brother    Lung cancer Daughter    Cancer Daughter    Social History   Socioeconomic History   Marital status: Married    Spouse name: [place]   Number of children: Not on file   Years of education: college   Highest education level: Not on file  Occupational History   Not on file  Tobacco Use   Smoking status: Never    Passive exposure: Never   Smokeless tobacco: Never  Vaping Use   Vaping Use: Never used  Substance and Sexual Activity   Alcohol use: Yes    Alcohol/week: 1.0 standard drink of alcohol    Types: 1 Glasses of wine per week    Comment: 1 Drink/Week   Drug use: No   Sexual activity: Yes    Birth control/protection: None    Comment: NA  Other Topics Concern   Not on file  Social History Narrative   Not on file   Social Determinants of Health   Financial Resource Strain: Low Risk  (12/19/2021)   Overall Financial Resource Strain (CARDIA)    Difficulty of Paying Living Expenses: Not hard at all  Food Insecurity: No Food Insecurity (12/19/2021)   Hunger Vital Sign    Worried About Running Out of Food in the Last Year: Never true    [place] in the Last Year: Never true  Transportation Needs: Unknown (12/19/2021)   PRAPARE - Hydrologist (Medical): Not on file    Lack of Transportation (Non-Medical): No  Physical Activity: Inactive (12/19/2021)   Exercise Vital Sign    Days of Exercise per Week: 0 days    Minutes of Exercise per  Session: 0 min  Stress: No Stress Concern Present (12/19/2021)   [place]    Feeling of Stress : Not at all  Social Connections: Moderately Integrated (12/19/2021)   Social Connection and Isolation Panel [NHANES]    Frequency of Communication with Friends and Family: More than three times a week    Frequency of Social Gatherings with Friends and Family: More than three times a week    Attends Religious Services: More than 4 times per year    Active Member of [employer] or Organizations: No    Attends Music therapist: Never    Marital Status: Married    Tobacco Counseling Counseling given: Not Answered   Clinical Intake:  Pre-visit preparation completed: Yes  Pain : No/denies pain     Diabetes: No  How often do you need to have someone help you when you read instructions, pamphlets, or other written materials from your doctor or pharmacy?: 1 - Never  Diabetic?  no  Interpreter Needed?: No  Information entered by :: Leroy Kennedy LPN   Activities of Daily Living    12/19/2021    9:41 AM 08/12/2021    6:02 PM  In your present state of health, do you have any difficulty performing the following activities:  Hearing? 0   Vision? 0   Difficulty concentrating or making decisions? 0   Walking or climbing stairs? 0   Dressing or bathing? 0   Doing errands, shopping? 0 0  Preparing Food and eating ? N   Using the Toilet? N   In the past six months, have you accidently leaked urine? N   Do you have problems with loss of bowel control? N   Managing your Medications? N   Managing your Finances? N     Patient Care Team: Valerie Roys, DO as PCP - General (Family Medicine) [name], MD as PCP - Cardiology (Cardiology) Deboraha Sprang, MD as PCP - Electrophysiology (Cardiology) [name], MD as Consulting Physician (Cardiology) Deboraha Sprang, MD as Consulting Physician  (Cardiology)  Indicate any recent Medical Services you may have received from other than [facility] providers in the past year (date may be approximate).     Assessment:   This is a routine wellness  examination for [facility].  Hearing/Vision screen Hearing Screening - Comments:: No trouble hearing Vision Screening - Comments:: Up to date [place]  Cataract surgery 2021  Dietary issues and exercise activities discussed: Current Exercise Habits: The patient does not participate in regular exercise at present   Goals Addressed             This Visit's Progress    DIET - [place]   Not on track    Recommend drinking at least 6-8 glasses of water a day     Increase physical activity         Depression Screen    12/19/2021    9:41 AM 12/18/2020    1:19 PM 08/22/2019   10:16 AM 03/16/2017    9:07 AM 02/28/2016    4:04 PM 12/20/2014    2:50 PM  PHQ 2/9 Scores  PHQ - 2 Score 0 0 0 0 0 0  PHQ- 9 Score 1 0        Fall Risk    12/19/2021    9:34 AM 12/18/2020    1:19 PM 08/22/2019   10:16 AM 03/16/2017    9:07 AM 02/28/2016    4:04 PM  Fall Risk   Falls in the past year? 0 0 0 No No  Number falls in past yr: 0  0    Injury with Fall? 0  0    Follow up Falls evaluation completed;Education provided;Falls prevention discussed        FALL RISK PREVENTION PERTAINING TO THE HOME:  Any stairs in or around the home? Yes  If so, are there any without handrails? No  Home free of loose throw rugs in walkways, pet beds, electrical cords, etc? Yes  Adequate lighting in your home to reduce risk of falls? Yes   ASSISTIVE DEVICES UTILIZED TO PREVENT FALLS:  Life alert? No  Use of a cane, walker or w/c? No  Grab bars in the bathroom? No  Shower chair or bench in shower? No  Elevated toilet seat or a handicapped toilet? No   TIMED UP AND GO:  Was the test performed? No .    Cognitive Function:        12/19/2021    9:35 AM 12/18/2020    1:38 PM 03/16/2017    9:11 AM   6CIT Screen  What Year? 0 points 0 points 0 points  What month? 0 points 0 points 0 points  What time? 0 points 0 points 0 points  Count back from 20 0 points 0 points 0 points  Months in reverse 0 points 0 points 0 points  Repeat phrase 0 points 2 points 0 points  Total Score 0 points 2 points 0 points    Immunizations Immunization History  Administered Date(s) Administered   Fluad Quad(high Dose 65+) 12/18/2020   Influenza, High Dose Seasonal PF 03/16/2017   Influenza-Unspecified 12/30/2018   PFIZER(Purple Top)SARS-COV-2 Vaccination 03/15/2019, 04/05/2019   Pneumococcal Conjugate-13 12/15/2013   Tdap 08/25/2011   Zoster, Live 08/25/2011    TDAP status: Due, Education has been provided regarding the importance of this vaccine. Advised may receive this vaccine at local pharmacy or Health Dept. Aware to provide a copy of the vaccination record if obtained from local pharmacy or Health Dept. Verbalized acceptance and understanding.  Flu Vaccine status: Due, Education has been provided regarding the importance of this vaccine. Advised may receive this vaccine at local pharmacy or Health Dept. Aware to provide a copy of the vaccination  record if obtained from local pharmacy or Health Dept. Verbalized acceptance and understanding.  Pneumococcal vaccine status: Due, Education has been provided regarding the importance of this vaccine. Advised may receive this vaccine at local pharmacy or Health Dept. Aware to provide a copy of the vaccination record if obtained from local pharmacy or Health Dept. Verbalized acceptance and understanding.  Covid-19 vaccine status: Information provided on how to obtain vaccines.   Qualifies for Shingles Vaccine? Yes   Zostavax completed Yes   Shingrix Completed?: No.    Education has been provided regarding the importance of this vaccine. Patient has been advised to call insurance company to determine out of pocket expense if they have not yet received this  vaccine. Advised may also receive vaccine at local pharmacy or Health Dept. Verbalized acceptance and understanding.  Screening Tests Health Maintenance  Topic Date Due   Pneumonia Vaccine 49+ Years old (2 - PPSV23 or PCV20) 12/16/2014   INFLUENZA VACCINE  10/08/2021   COVID-19 Vaccine (3 - Pfizer risk series) 01/04/2022 (Originally 05/03/2019)   Zoster Vaccines- Shingrix (1 of 2) 03/21/2022 (Originally 08/03/1964)   TETANUS/TDAP  12/20/2022 (Originally 08/24/2021)   Hepatitis C Screening  Completed   HPV VACCINES  Aged Out   COLONOSCOPY (Pts 45-74yr Insurance coverage will need to be confirmed)  Discontinued    Health Maintenance  Health Maintenance Due  Topic Date Due   Pneumonia Vaccine 78 Years old (2 - PPSV23 or PCV20) 12/16/2014   INFLUENZA VACCINE  10/08/2021    Colorectal cancer screening: No longer required.   Lung Cancer Screening: (Low Dose CT Chest recommended if Age 78-80years, 30 pack-year currently smoking OR have quit w/in 15years.) does not qualify.   Lung Cancer Screening Referral:   Additional Screening:  Hepatitis C Screening: does not qualify; Completed 2019  Vision Screening: Recommended annual ophthalmology exams for early detection of glaucoma and other disorders of the eye. Is the patient up to date with their annual eye exam?  No  Who is the provider or what is the name of the office in which the patient attends annual eye exams? [facility] If pt is not established with a provider, would they like to be referred to a provider to establish care? No .   Dental Screening: Recommended annual dental exams for proper oral hygiene  Community Resource Referral / Chronic Care Management: CRR required this visit?  No   CCM required this visit?  No      Plan:     I have personally reviewed and noted the following in the patient's chart:   Medical and social history Use of alcohol, tobacco or illicit drugs  Current medications and supplements  including opioid prescriptions. Patient is not currently taking opioid prescriptions. Functional ability and status Nutritional status Physical activity Advanced directives List of other physicians Hospitalizations, surgeries, and ER visits in previous 12 months Vitals Screenings to include cognitive, depression, and falls Referrals and appointments  In addition, I have reviewed and discussed with patient certain preventive protocols, quality metrics, and best practice recommendations. A written personalized care plan for preventive services as well as general preventive health recommendations were provided to patient.     JLeroy Kennedy LPN   [id number]  Nurse Notes:

## 2021-12-23 ENCOUNTER — Encounter: Payer: Self-pay | Admitting: Family Medicine

## 2021-12-23 ENCOUNTER — Ambulatory Visit (INDEPENDENT_AMBULATORY_CARE_PROVIDER_SITE_OTHER): Payer: Medicare HMO | Admitting: Family Medicine

## 2021-12-23 VITALS — BP 137/87 | HR 64 | Temp 97.7°F | Ht 70.0 in | Wt 184.6 lb

## 2021-12-23 DIAGNOSIS — Z23 Encounter for immunization: Secondary | ICD-10-CM

## 2021-12-23 DIAGNOSIS — R350 Frequency of micturition: Secondary | ICD-10-CM | POA: Diagnosis not present

## 2021-12-23 DIAGNOSIS — R002 Palpitations: Secondary | ICD-10-CM | POA: Diagnosis not present

## 2021-12-23 DIAGNOSIS — I420 Dilated cardiomyopathy: Secondary | ICD-10-CM

## 2021-12-23 DIAGNOSIS — K2100 Gastro-esophageal reflux disease with esophagitis, without bleeding: Secondary | ICD-10-CM

## 2021-12-23 DIAGNOSIS — N401 Enlarged prostate with lower urinary tract symptoms: Secondary | ICD-10-CM | POA: Diagnosis not present

## 2021-12-23 DIAGNOSIS — I5022 Chronic systolic (congestive) heart failure: Secondary | ICD-10-CM

## 2021-12-23 DIAGNOSIS — Z0000 Encounter for general adult medical examination without abnormal findings: Secondary | ICD-10-CM | POA: Diagnosis not present

## 2021-12-23 LAB — URINALYSIS, ROUTINE W REFLEX MICROSCOPIC
Bilirubin, UA: NEGATIVE
Glucose, UA: NEGATIVE
Ketones, UA: NEGATIVE
Leukocytes,UA: NEGATIVE
Nitrite, UA: NEGATIVE
Protein,UA: NEGATIVE
RBC, UA: NEGATIVE
Specific Gravity, UA: 1.02 (ref 1.005–1.030)
Urobilinogen, Ur: 0.2 mg/dL (ref 0.2–1.0)
pH, UA: 6 (ref 5.0–7.5)

## 2021-12-23 LAB — MICROALBUMIN, URINE WAIVED
Creatinine, Urine Waived: 100 mg/dL (ref 10–300)
Microalb, Ur Waived: 10 mg/L (ref 0–19)
Microalb/Creat Ratio: <30 mg/g (ref <30)

## 2021-12-23 MED ORDER — OMEPRAZOLE 20 MG PO CPDR: 20.0000 mg | DELAYED_RELEASE_CAPSULE | ORAL | 3 refills | Status: DC

## 2021-12-23 MED ORDER — TAMSULOSIN HCL 0.4 MG PO CAPS: 0.4000 mg | ORAL_CAPSULE | Freq: Every day | ORAL | 1 refills | Status: DC

## 2021-12-23 MED ORDER — FINASTERIDE 5 MG PO TABS: 5.0000 mg | ORAL_TABLET | Freq: Every day | ORAL | 1 refills | Status: DC

## 2021-12-23 NOTE — Progress Notes (Signed)
BP 137/87   Pulse 64   Temp 97.7 F (36.5 C)   Ht '5\' 10"'$  (1.778 m)   Wt 184 lb 9.6 oz (83.7 kg)   SpO2 98%   BMI 26.49 kg/m    Subjective:    Patient ID: Leroy Merritt, male    DOB: 1946/02/18, 76 y.o.   MRN: [id number]  HPI: Leroy Merritt is a 76 y.o. male presenting on 12/23/2021 for comprehensive medical examination. Current medical complaints include:  BPH BPH status: controlled Satisfied with current treatment?: yes Medication side effects: yes Medication compliance: excellent compliance Duration: chronic Nocturia: no Urinary frequency:no Incomplete voiding: no Urgency: no Weak urinary stream: no Straining to start stream: no Dysuria: no Onset: gradual Severity: mild  GERD GERD control status: controlled Satisfied with current treatment? yes Heartburn frequency: rarely Medication side effects: no  Medication compliance: excellent Dysphagia: no Odynophagia:  no Hematemesis: no Blood in stool: no EGD: no  Interim Problems from his last visit: no  Depression Screen done today and results listed below:     12/23/2021   10:24 AM 12/19/2021    9:41 AM 12/18/2020    1:19 PM 08/22/2019   10:16 AM 03/16/2017    9:07 AM  Depression screen PHQ 2/9  Decreased Interest 0 0 0 0 0  Down, Depressed, Hopeless 0 0 0 0 0  PHQ - 2 Score 0 0 0 0 0  Altered sleeping 0 0 0    Tired, decreased energy 0 1 0    Change in appetite 0 0 0    Feeling bad or failure about yourself  0 0     Trouble concentrating 0 0 0    Moving slowly or fidgety/restless 0 0 0    Suicidal thoughts 0 0 0    PHQ-9 Score 0 1 0    Difficult doing work/chores Not difficult at all Not difficult at all       Past Medical History:  Past Medical History:  Diagnosis Date   AICD (automatic cardioverter/defibrillator) present    Arthritis    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 b.) TTE 03/13/2020: Ao root 368m Asc. Ao 3784m AV dissociation  04/28/2017   BPH (benign prostatic hyperplasia)    CAD (coronary artery disease)    a.) LHC 04/28/2017: severe LV dysfunction with EF 25-30%; global HK, LVEDP 14 mmHg; no obstructive CAD.   Cataract    Dilated cardiomyopathy ([place]  a.) LHC 04/28/2017: EF 35-35%. b.) TTE 04/29/2017: 30-35%. c.) TTE 03/22/2019: 50-55%. d.) TTE 03/13/2020: EF45-50%.   Diverticulosis 2017   Dysphagia 12/20/2014   Esophagitis    First degree AV block    GERD (gastroesophageal reflux disease)    HFrEF (heart failure with reduced ejection fraction) ([place]/20/2019   a.) TTE 04/29/2017: mod-sev LV dysfunction; EF 30-35%; global HK; G1DD. b.) Cardiac MRI 04/29/2017: biventricular/biatrial enlargement; LVEF 27%; global HK. c.) TTE 03/13/2020: EF 45-50%; global HK; mild LVH; GLS -12.7%; AoV sclerosis without stenosis; mild MR, triv TR; G1DD.   Hypercholesterolemia    ICD (implantable cardioverter-defibrillator) in place    a.) St. Judes device   NICM (nonischemic cardiomyopathy) ([place]  a.) TTE 04/29/2017: EF 30-35%. b.) Cardiac MRI 04/29/2017: EF 27%.   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 Jude dual-chamber ICD placement    Surgical History:  Past Surgical History:  Procedure Laterality Date   CARDIAC CATHETERIZATION  04/28/2017   CATARACT EXTRACTION W/ INTRAOCULAR LENS IMPLANT Right 2017   CATARACT EXTRACTION W/PHACO Left 05/20/2019   Procedure: CATARACT EXTRACTION PHACO AND INTRAOCULAR LENS PLACEMENT ([place]) LEFT;  Surgeon: Birder Robson, MD;  Location: [facility];  Service: Ophthalmology;  Laterality: Left;  [place] 01:19.7 CDE 14.58 Fluid Pack Lot # [id number]   COLONOSCOPY     ESOPHAGOGASTRODUODENOSCOPY (EGD) WITH  PROPOFOL N/A 02/19/2015   Procedure: ESOPHAGOGASTRODUODENOSCOPY (EGD) WITH PROPOFOL and esophageal dilation.;  Surgeon: Lucilla Lame, MD;  Location: [facility];  Service: Endoscopy;  Laterality: N/A;   EYE SURGERY     ICD IMPLANT N/A 04/29/2017   Procedure: ICD IMPLANT;  Surgeon: Evans Lance, MD;  Location: [facility];  Service: Cardiovascular;  Laterality: N/A;   KNEE ARTHROPLASTY Left 08/12/2021   Procedure: COMPUTER ASSISTED TOTAL KNEE ARTHROPLASTY;  Surgeon: Dereck Leep, MD;  Location: [facility];  Service: Orthopedics;  Laterality: Left;   LEFT HEART CATH AND CORONARY ANGIOGRAPHY N/A 04/28/2017   Procedure: LEFT HEART CATH AND CORONARY ANGIOGRAPHY;  Surgeon: Martinique, Peter M, MD;  Location: [facility];  Service: Cardiovascular;  Laterality: N/A;   PROSTATE BIOPSY  ~ 2012   RETINAL DETACHMENT SURGERY Right 1990s   "torn retina"   TOTAL HIP ARTHROPLASTY Right 03/20/2021   Procedure: TOTAL HIP ARTHROPLASTY;  Surgeon: Dereck Leep, MD;  Location: [facility];  Service: Orthopedics;  Laterality: Right;    Medications:  Current Outpatient Medications on File Prior to Visit  Medication Sig   clobetasol ointment (TEMOVATE) 9.79 % Apply 1 application topically 2 (two) times daily. (Patient taking differently: Apply 1 application  topically daily. Apply to penis)   ENTRESTO 49-51 MG TAKE ONE (1) TABLET BY MOUTH TWO TIMES PER DAY   eplerenone (INSPRA) 25 MG tablet TAKE ONE TABLET BY MOUTH DAILY   sotalol (BETAPACE) 80 MG tablet Take 1 tablet (80 mg total) by mouth 2 (two) times daily.   No current facility-administered medications on file prior to visit.    Allergies:  No Known Allergies  Social History:  Social History   Socioeconomic History   Marital status: Married    Spouse name: Grayling   Number of children: Not on file   Years of education: college   Highest education level: Not on file  Occupational History   Not on file  Tobacco Use   Smoking  status: Never    Passive exposure: Never   Smokeless tobacco: Never  Vaping Use   Vaping Use: Never used  Substance and Sexual Activity   Alcohol use: Yes    Alcohol/week: 1.0 standard drink of alcohol    Types: 1 Glasses of wine per week    Comment: 1 Drink/Week   Drug use: No   Sexual activity: Yes    Birth control/protection: None    Comment: NA  Other Topics Concern   Not on file  Social History Narrative   Not on file   Social Determinants of Health   Financial Resource Strain: Low Risk  (12/19/2021)   Overall Financial Resource Strain (CARDIA)    Difficulty of Paying Living Expenses: Not hard at all  Food Insecurity: No Food Insecurity (12/19/2021)   Hunger Vital Sign  Worried About Charity fundraiser in the Last Year: Never true    [place] in the Last Year: Never true  Transportation Needs: Unknown (12/19/2021)   PRAPARE - Hydrologist (Medical): Not on file    Lack of Transportation (Non-Medical): No  Physical Activity: Inactive (12/19/2021)   Exercise Vital Sign    Days of Exercise per Week: 0 days    Minutes of Exercise per Session: 0 min  Stress: No Stress Concern Present (12/19/2021)   [place]    Feeling of Stress : Not at all  Social Connections: Moderately Integrated (12/19/2021)   Social Connection and Isolation Panel [NHANES]    Frequency of Communication with Friends and Family: More than three times a week    Frequency of Social Gatherings with Friends and Family: More than three times a week    Attends Religious Services: More than 4 times per year    Active Member of [employer] or Organizations: No    Attends Archivist Meetings: Never    Marital Status: Married  Human resources officer Violence: Not At Risk (12/19/2021)   Humiliation, Afraid, Rape, and Kick questionnaire    Fear of Current or Ex-Partner: No    Emotionally Abused: No     Physically Abused: No    Sexually Abused: No   Social History   Tobacco Use  Smoking Status Never   Passive exposure: Never  Smokeless Tobacco Never   Social History   Substance and Sexual Activity  Alcohol Use Yes   Alcohol/week: 1.0 standard drink of alcohol   Types: 1 Glasses of wine per week   Comment: 1 Drink/Week    Family History:  Family History  Problem Relation Age of Onset   Arthritis Mother    Diabetes Father    Hypertension Father    Heart disease Brother    Hyperlipidemia Brother    Hypertension Brother    Lung cancer Daughter    Cancer Daughter     Past medical history, surgical history, medications, allergies, family history and social history reviewed with patient today and changes made to appropriate areas of the chart.   Review of Systems  Constitutional: Negative.   HENT:  Positive for hearing loss. Negative for congestion, ear discharge, ear pain, nosebleeds, sinus pain, sore throat and tinnitus.   Eyes: Negative.   Respiratory: Negative.  Negative for stridor.   Cardiovascular: Negative.   Gastrointestinal: Negative.   Genitourinary: Negative.   Musculoskeletal: Negative.   Skin: Negative.   Neurological: Negative.   Endo/Heme/Allergies: Negative.   Psychiatric/Behavioral: Negative.     All other ROS negative except what is listed above and in the HPI.      Objective:    BP 137/87   Pulse 64   Temp 97.7 F (36.5 C)   Ht '5\' 10"'$  (1.778 m)   Wt 184 lb 9.6 oz (83.7 kg)   SpO2 98%   BMI 26.49 kg/m   Wt Readings from Last 3 Encounters:  12/23/21 184 lb 9.6 oz (83.7 kg)  09/03/21 179 lb 9.6 oz (81.5 kg)  08/12/21 182 lb 6.4 oz (82.7 kg)    Physical Exam Vitals and nursing note reviewed.  Constitutional:      General: He is not in acute distress.    Appearance: Normal appearance. He is obese. He is not ill-appearing, toxic-appearing or diaphoretic.  HENT:     Head: Normocephalic and atraumatic.  Right Ear: Tympanic membrane,  ear canal and external ear normal. There is no impacted cerumen.     Left Ear: Tympanic membrane, ear canal and external ear normal. There is no impacted cerumen.     Nose: Nose normal. No congestion or rhinorrhea.     Mouth/Throat:     Mouth: Mucous membranes are moist.     Pharynx: Oropharynx is clear. No oropharyngeal exudate or posterior oropharyngeal erythema.  Eyes:     General: No scleral icterus.       Right eye: No discharge.        Left eye: No discharge.     Extraocular Movements: Extraocular movements intact.     Conjunctiva/sclera: Conjunctivae normal.     Pupils: Pupils are equal, round, and reactive to light.  Neck:     Vascular: No carotid bruit.  Cardiovascular:     Rate and Rhythm: Normal rate and regular rhythm.     Pulses: Normal pulses.     Heart sounds: No murmur heard.    No friction rub. No gallop.  Pulmonary:     Effort: Pulmonary effort is normal. No respiratory distress.     Breath sounds: Normal breath sounds. No stridor. No wheezing, rhonchi or rales.  Chest:     Chest wall: No tenderness.  Abdominal:     General: Abdomen is flat. Bowel sounds are normal. There is no distension.     Palpations: Abdomen is soft. There is no mass.     Tenderness: There is no abdominal tenderness. There is no right CVA tenderness, left CVA tenderness, guarding or rebound.     Hernia: No hernia is present.  Genitourinary:    Comments: Genital exam deferred with shared decision making Musculoskeletal:        General: No swelling, tenderness, deformity or signs of injury.     Cervical back: Normal range of motion and neck supple. No rigidity. No muscular tenderness.     Right lower leg: No edema.     Left lower leg: No edema.  Lymphadenopathy:     Cervical: No cervical adenopathy.  Skin:    General: Skin is warm and dry.     Capillary Refill: Capillary refill takes less than 2 seconds.     Coloration: Skin is not jaundiced or pale.     Findings: No bruising, erythema,  lesion or rash.  Neurological:     General: No focal deficit present.     Mental Status: He is alert and oriented to person, place, and time.     Cranial Nerves: No cranial nerve deficit.     Sensory: No sensory deficit.     Motor: No weakness.     Coordination: Coordination normal.     Gait: Gait normal.     Deep Tendon Reflexes: Reflexes normal.  Psychiatric:        Mood and Affect: Mood normal.        Behavior: Behavior normal.        Thought Content: Thought content normal.        Judgment: Judgment normal.     Results for orders placed or performed in visit on 12/23/21  Urinalysis, Routine w reflex microscopic  Result Value Ref Range   Specific Gravity, UA 1.020 1.005 - 1.030   pH, UA 6.0 5.0 - 7.5   Color, UA Yellow Yellow   Appearance Ur Clear Clear   Leukocytes,UA Negative Negative   Protein,UA Negative Negative/Trace   Glucose, UA Negative Negative  Ketones, UA Negative Negative   RBC, UA Negative Negative   Bilirubin, UA Negative Negative   Urobilinogen, Ur 0.2 0.2 - 1.0 mg/dL   Nitrite, UA Negative Negative  Microalbumin, Urine Waived  Result Value Ref Range   Microalb, Ur Waived 10 0 - 19 mg/L   Creatinine, Urine Waived 100 10 - 300 mg/dL   Microalb/Creat Ratio <30 <30 mg/g      Assessment & Plan:   Problem List Items Addressed This Visit       Cardiovascular and Mediastinum   Dilated cardiomyopathy ([place])    Euvolemic. Continue to follow with cardiology. Labs drawn today. Call with any concerns.       Chronic systolic heart failure (HCC)    Euvolemic. Continue to follow with cardiology. Labs drawn today. Call with any concerns.       Relevant Orders   Comprehensive metabolic panel   Lipid Panel w/o Chol/HDL Ratio   Microalbumin, Urine Waived (Completed)   Magnesium     Digestive   Reflux esophagitis    Under good control on current regimen. Continue current regimen. Continue to monitor. Call with any concerns. Refills given. Labs drawn today.         Relevant Medications   omeprazole (PRILOSEC) 20 MG capsule   Other Relevant Orders   Comprehensive metabolic panel   CBC with Differential/Platelet   Magnesium     Genitourinary   BPH (benign prostatic hyperplasia)    Under good control on current regimen. Continue current regimen. Continue to monitor. Call with any concerns. Refills given. Labs drawn today.        Relevant Medications   tamsulosin (FLOMAX) 0.4 MG CAPS capsule   finasteride (PROSCAR) 5 MG tablet   Other Relevant Orders   Urinalysis, Routine w reflex microscopic (Completed)   Magnesium     Other   Palpitations    Not bothering him at this time. Will check labs. Await results. Treat as needed. Continue to follow with cardiology.      Relevant Orders   Comprehensive metabolic panel   TSH   Magnesium   Other Visit Diagnoses     Routine general medical examination at a health care facility    -  Primary   Vaccines up to date. Screening labs checked today. Continue diet and exercise. Call with any concerns. Continue to monitor.         LABORATORY TESTING:  Health maintenance labs ordered today as discussed above.   IMMUNIZATIONS:   - Tdap: Tetanus vaccination status reviewed: Declined. - Influenza: Refused - Pneumovax: Administered today - Prevnar: Up to date - COVID: Up to date - HPV: Not applicable - Shingrix vaccine: Refused  SCREENING: - Colonoscopy: Not applicable  Discussed with patient purpose of the colonoscopy is to detect colon cancer at curable precancerous or early stages    PATIENT COUNSELING:    Sexuality: Discussed sexually transmitted diseases, partner selection, use of condoms, avoidance of unintended pregnancy  and contraceptive alternatives.   Advised to avoid cigarette smoking.  I discussed with the patient that most people either abstain from alcohol or drink within safe limits (<=14/week and <=4 drinks/occasion for males, <=7/weeks and <= 3 drinks/occasion for  females) and that the risk for alcohol disorders and other health effects rises proportionally with the number of drinks per week and how often a drinker exceeds daily limits.  Discussed cessation/primary prevention of drug use and availability of treatment for abuse.   Diet: Encouraged to adjust caloric intake  to maintain  or achieve ideal body weight, to reduce intake of dietary saturated fat and total fat, to limit sodium intake by avoiding high sodium foods and not adding table salt, and to maintain adequate dietary potassium and calcium preferably from fresh fruits, vegetables, and low-fat dairy products.    stressed the importance of regular exercise  Injury prevention: Discussed safety belts, safety helmets, smoke detector, smoking near bedding or upholstery.   Dental health: Discussed importance of regular tooth brushing, flossing, and dental visits.   Follow up plan: NEXT PREVENTATIVE PHYSICAL DUE IN 1 YEAR. Return in about 6 months (around 06/24/2022).

## 2021-12-23 NOTE — Assessment & Plan Note (Signed)
Euvolemic. Continue to follow with cardiology. Labs drawn today. Call with any concerns.

## 2021-12-23 NOTE — Assessment & Plan Note (Signed)
Not bothering him at this time. Will check labs. Await results. Treat as needed. Continue to follow with cardiology.

## 2021-12-23 NOTE — Assessment & Plan Note (Signed)
Under good control on current regimen. Continue current regimen. Continue to monitor. Call with any concerns. Refills given. Labs drawn today.   

## 2021-12-24 LAB — CBC WITH DIFFERENTIAL/PLATELET
Basophils Absolute: 0 10*3/uL (ref 0.0–0.2)
Basos: 1 %
EOS (ABSOLUTE): 0.1 10*3/uL (ref 0.0–0.4)
Eos: 2 %
Hematocrit: 45.2 % (ref 37.5–51.0)
Hemoglobin: 14.5 g/dL (ref 13.0–17.7)
Immature Grans (Abs): 0 10*3/uL (ref 0.0–0.1)
Immature Granulocytes: 0 %
Lymphocytes Absolute: 1.5 10*3/uL (ref 0.7–3.1)
Lymphs: 24 %
MCH: 27.2 pg (ref 26.6–33.0)
MCHC: 32.1 g/dL (ref 31.5–35.7)
MCV: 85 fL (ref 79–97)
Monocytes Absolute: 0.6 10*3/uL (ref 0.1–0.9)
Monocytes: 10 %
Neutrophils Absolute: 4 10*3/uL (ref 1.4–7.0)
Neutrophils: 63 %
Platelets: 165 10*3/uL (ref 150–450)
RBC: 5.33 x10E6/uL (ref 4.14–5.80)
RDW: 14 % (ref 11.6–15.4)
WBC: 6.2 10*3/uL (ref 3.4–10.8)

## 2021-12-24 LAB — COMPREHENSIVE METABOLIC PANEL
ALT: 9 IU/L (ref 0–44)
AST: 11 IU/L (ref 0–40)
Albumin/Globulin Ratio: 1.4 (ref 1.2–2.2)
Albumin: 3.9 g/dL (ref 3.8–4.8)
Alkaline Phosphatase: 79 IU/L (ref 44–121)
BUN/Creatinine Ratio: 13 (ref 10–24)
BUN: 15 mg/dL (ref 8–27)
Bilirubin Total: 0.3 mg/dL (ref 0.0–1.2)
CO2: 22 mmol/L (ref 20–29)
Calcium: 9.2 mg/dL (ref 8.6–10.2)
Chloride: 105 mmol/L (ref 96–106)
Creatinine, Ser: 1.15 mg/dL (ref 0.76–1.27)
Globulin, Total: 2.7 g/dL (ref 1.5–4.5)
Glucose: 102 mg/dL — ABNORMAL HIGH (ref 70–99)
Potassium: 4.4 mmol/L (ref 3.5–5.2)
Sodium: 141 mmol/L (ref 134–144)
Total Protein: 6.6 g/dL (ref 6.0–8.5)
eGFR: 65 mL/min/{1.73_m2} (ref >59)

## 2021-12-24 LAB — MAGNESIUM: Magnesium: 2.2 mg/dL (ref 1.6–2.3)

## 2021-12-24 LAB — LIPID PANEL W/O CHOL/HDL RATIO
Cholesterol, Total: 174 mg/dL (ref 100–199)
HDL: 47 mg/dL (ref >39)
LDL Chol Calc (NIH): 108 mg/dL — ABNORMAL HIGH (ref 0–99)
Triglycerides: 104 mg/dL (ref 0–149)
VLDL Cholesterol Cal: 19 mg/dL (ref 5–40)

## 2021-12-24 LAB — TSH: TSH: 1.21 u[IU]/mL (ref 0.450–4.500)

## 2021-12-27 ENCOUNTER — Encounter: Payer: Self-pay | Admitting: Family Medicine

## 2021-12-27 DIAGNOSIS — N401 Enlarged prostate with lower urinary tract symptoms: Secondary | ICD-10-CM

## 2021-12-30 NOTE — Progress Notes (Signed)
Cardiology Office Note  Date:  12/31/2021   ID:  Clancey, Welton 1945/07/08, MRN [id number]  PCP:  Valerie Roys, DO   Chief Complaint  Patient presents with   12 month follow up     "Doing well.," Medications reviewed by the patient verbally.     HPI:  [name] is a 75 yo male with PMH Nonischemic cardiomyopathy nonobstructive CAD by LHC in 04/2017  2/19 Echo  30-35 %  HFrEF  symptomatic VT status post SJM dual-chamber ICD in 04/2017 1/22 Echo 40-45%  On sotalol for PVC for suppression Who presents for follow-up of his cardiomyopathy, atrial arrhythmia, PVCs  LOV with myself July 2022 Seen by Dr. Caryl Comes June 2023  On sotalol 80 twice daily for PVC suppression Tolerating Entresto on eplerenone 25  Reports feeling well, denies chest pain, shortness of breath, leg swelling, no PND orthopnea  Lab work reviewed Total chol 174,LDL 108 Normal CMP  Pacer download reviewed 31 atrial arrhythmias that all lasted less than 30 seconds. AF burden is less than 1% of the time.  There was one short NSVT arrhythmia detected   Did not tolerate spironolactone in the past secondary to breast tenderness Did not tolerate Farxiga secondary to scrotal and groin rash, possible yeast infection, resolved with treatment  EKG personally reviewed by myself on todays visit Normal sinus rhythm rate 65 bpm no significant ST-T wave changes   Other past medical history reviewed Discussed prior events 2019  had long runs of sustained VT, underlying sinus rhythm Clear A-V dissociation  transferred from our office to [facility] which showed no obstructive CAD,  LVEF was markedly reduced with LV gram estimate of EF of 25 to 35%.    Echo on 04/29/2017 showed an EF of 30 to 35%,   Cardiac MRI on 04/29/2017 showed severe LVE with diffuse hypokinesis with an EF of 27%,   [employer] dual-chamber ICD implantation on 04/29/2017.  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/2017), BPH (benign prostatic hyperplasia), CAD (coronary artery disease), Cataract, Dilated cardiomyopathy ([place]), Diverticulosis (2017), Dysphagia (12/20/2014), Esophagitis, First degree AV block, GERD (gastroesophageal reflux disease), HFrEF (heart failure with reduced ejection fraction) ([place]) (04/29/2017), Hypercholesterolemia, ICD (implantable cardioverter-defibrillator) in place, NICM (nonischemic cardiomyopathy) ([place]), Palpitations, Stricture and stenosis of esophagus, Urethral stricture (03/2021), 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  PSH:    Past Surgical History:  Procedure Laterality Date   CARDIAC CATHETERIZATION  04/28/2017   CATARACT EXTRACTION W/ INTRAOCULAR LENS IMPLANT Right 2017   CATARACT EXTRACTION W/PHACO Left 05/20/2019   Procedure: CATARACT EXTRACTION PHACO AND INTRAOCULAR LENS PLACEMENT ([place]) LEFT;  Surgeon: Birder Robson, MD;  Location: [facility];  Service: Ophthalmology;  Laterality: Left;  [place] 01:19.7 CDE 14.58 Fluid Pack Lot # [id number]   COLONOSCOPY     ESOPHAGOGASTRODUODENOSCOPY (EGD) WITH PROPOFOL N/A 02/19/2015   Procedure: ESOPHAGOGASTRODUODENOSCOPY (EGD) WITH PROPOFOL and esophageal dilation.;  Surgeon: Lucilla Lame, MD;  Location: [place];  Service: Endoscopy;  Laterality: N/A;   EYE SURGERY     ICD IMPLANT N/A 04/29/2017   Procedure: ICD IMPLANT;  Surgeon: Evans Lance, MD;  Location: [place] [facility];  Service: Cardiovascular;  Laterality: N/A;   KNEE ARTHROPLASTY Left 08/12/2021   Procedure: COMPUTER ASSISTED TOTAL KNEE ARTHROPLASTY;  Surgeon: Dereck Leep, MD;  Location: [facility];  Service: Orthopedics;  Laterality: Left;   LEFT HEART CATH AND CORONARY ANGIOGRAPHY N/A 04/28/2017   Procedure: LEFT HEART CATH AND CORONARY ANGIOGRAPHY;  Surgeon: Martinique, Peter M, MD;   Location: [place] [facility];  Service: Cardiovascular;  Laterality: N/A;   PROSTATE BIOPSY  ~ 2012   RETINAL DETACHMENT SURGERY Right 1990s   "torn retina"   TOTAL HIP ARTHROPLASTY Right 03/20/2021   Procedure: TOTAL HIP ARTHROPLASTY;  Surgeon: Dereck Leep, MD;  Location: [facility];  Service: Orthopedics;  Laterality: Right;    Current Outpatient Medications  Medication Sig Dispense Refill   clobetasol ointment (TEMOVATE) 0.86 % Apply 1 application topically 2 (two) times daily. 30 g 0   ENTRESTO 49-51 MG TAKE ONE (1) TABLET BY MOUTH TWO TIMES PER DAY 180 tablet 2   eplerenone (INSPRA) 25 MG tablet TAKE ONE TABLET BY MOUTH DAILY 90 tablet 3   finasteride (PROSCAR) 5 MG tablet Take 1 tablet (5 mg total) by mouth at bedtime. 90 tablet 1   omeprazole (PRILOSEC) 20 MG capsule Take 1 capsule (20 mg total) by mouth every morning. 90 capsule 3   sotalol (BETAPACE) 80 MG tablet Take 1 tablet (80 mg total) by mouth 2 (two) times daily. 14 tablet 0   tamsulosin (FLOMAX) 0.4 MG CAPS capsule Take 1 capsule (0.4 mg total) by mouth daily after supper. 90 capsule 1   No current facility-administered medications for this visit.     Allergies:   Patient has no known allergies.   Social History:  The patient  reports that he has never smoked. He has never been exposed to tobacco smoke. He has never used smokeless tobacco. He reports current alcohol use of about 1.0 standard drink of alcohol per week. He reports that he does not use drugs.   Family History:   family history includes Arthritis in his mother; Cancer in his daughter; Diabetes in his father; Heart disease in his brother; Hyperlipidemia in his brother; Hypertension in his brother and father; Lung cancer in his daughter.    Review of Systems: Review of Systems  Constitutional: Negative.   HENT: Negative.    Respiratory: Negative.    Cardiovascular: Negative.   Gastrointestinal: Negative.   Musculoskeletal: Negative.   Neurological:  Negative.   Psychiatric/Behavioral: Negative.    All other systems reviewed and are negative.   PHYSICAL EXAM: VS:  BP 130/82 (BP Location: Left Arm, Patient Position: Sitting, Cuff Size: Normal)   Pulse 65   Ht '5\' 10"'$  (1.778 m)   Wt 183 lb 8 oz (83.2 kg)   SpO2 98%   BMI 26.33 kg/m  , BMI Body mass index is 26.33 kg/m. Constitutional:  oriented to person, place, and time. No distress.  HENT:  Head: Grossly normal Eyes:  no discharge. No scleral icterus.  Neck: No JVD, no carotid bruits  Cardiovascular: Regular rate and rhythm, no murmurs appreciated Pulmonary/Chest: Clear to auscultation bilaterally, no wheezes or rails Abdominal: Soft.  no distension.  no tenderness.  Musculoskeletal: Normal range of motion Neurological:  normal muscle tone. Coordination normal. No atrophy Skin: Skin warm and dry Psychiatric: normal affect, pleasant  Recent Labs: 12/23/2021: ALT 9; BUN 15; Creatinine, Ser 1.15; Hemoglobin 14.5; Magnesium 2.2; Platelets 165; Potassium 4.4; Sodium 141; TSH 1.210    Lipid Panel Lab Results  Component Value Date   CHOL 174 12/23/2021   HDL 47 12/23/2021   LDLCALC 108 (H) 12/23/2021   TRIG 104 12/23/2021      Wt Readings from Last 3  Encounters:  12/31/21 183 lb 8 oz (83.2 kg)  12/23/21 184 lb 9.6 oz (83.7 kg)  09/03/21 179 lb 9.6 oz (81.5 kg)     ASSESSMENT AND PLAN:  VT (ventricular tachycardia) (HCC) on sotalol ICD in place, followed by Dr. Caryl Comes ICD downloads reviewed, rare short nonsustained VT, asymptomatic  Tachyarrhythmias Denies significant tachypalpitations  Atrial tachyarrhythmia documented on ICD download less than 1% burden Managed by EP, not on anticoagulation  Dilated cardiomyopathy Off spironolactone secondary to breast tenderness Did not tolerate Farxiga, groin /scrotal rashes Continue Entresto  49/51 mg p.o. twice daily, eplerenone, sotalol Previously declined higher dose Entresto Ejection fraction 45 to 50% Asymptomatic,  appears euvolemic   Total encounter time more than 30 minutes  Greater than 50% was spent in counseling and coordination of care with the patient   No orders of the defined types were placed in this encounter.    Signed, Esmond Plants, M.D., Ph.D. 12/31/2021  [place], [place]

## 2021-12-31 ENCOUNTER — Other Ambulatory Visit: Payer: Medicare HMO

## 2021-12-31 ENCOUNTER — Ambulatory Visit: Payer: Medicare HMO | Attending: Cardiovascular Disease | Admitting: Cardiovascular Disease

## 2021-12-31 ENCOUNTER — Encounter: Payer: Self-pay | Admitting: Cardiovascular Disease

## 2021-12-31 VITALS — BP 130/82 | HR 65 | Ht 70.0 in | Wt 183.5 lb

## 2021-12-31 DIAGNOSIS — R0602 Shortness of breath: Secondary | ICD-10-CM

## 2021-12-31 DIAGNOSIS — N401 Enlarged prostate with lower urinary tract symptoms: Secondary | ICD-10-CM | POA: Diagnosis not present

## 2021-12-31 DIAGNOSIS — I428 Other cardiomyopathies: Secondary | ICD-10-CM

## 2021-12-31 DIAGNOSIS — I420 Dilated cardiomyopathy: Secondary | ICD-10-CM | POA: Diagnosis not present

## 2021-12-31 DIAGNOSIS — R350 Frequency of micturition: Secondary | ICD-10-CM | POA: Diagnosis not present

## 2021-12-31 DIAGNOSIS — I5022 Chronic systolic (congestive) heart failure: Secondary | ICD-10-CM | POA: Diagnosis not present

## 2021-12-31 DIAGNOSIS — I951 Orthostatic hypotension: Secondary | ICD-10-CM

## 2021-12-31 DIAGNOSIS — I472 Ventricular tachycardia, unspecified: Secondary | ICD-10-CM | POA: Diagnosis not present

## 2021-12-31 DIAGNOSIS — Z95810 Presence of automatic (implantable) cardiac defibrillator: Secondary | ICD-10-CM | POA: Diagnosis not present

## 2021-12-31 MED ORDER — SOTALOL HCL 80 MG PO TABS: 80.0000 mg | ORAL_TABLET | Freq: Two times a day (BID) | ORAL | 1 refills | Status: DC

## 2021-12-31 NOTE — Patient Instructions (Signed)
Medication Instructions:  No changes  If you need a refill on your cardiac medications before your next appointment, please call your pharmacy.   Lab work: No new labs needed  Testing/Procedures: No new testing needed  Follow-Up: At [facility], you and your health needs are our priority.  As part of our continuing mission to provide you with exceptional heart care, we have created designated Provider Care Teams.  These Care Teams include your primary Cardiologist (physician) and Advanced Practice Providers (APPs -  Physician Assistants and Nurse Practitioners) who all work together to provide you with the care you need, when you need it.  You will need a follow up appointment in 12 months  Providers on your designated Care Team:   Christopher Berge, NP Ryan Dunn, PA-C Cadence Furth, PA-C  COVID-19 Vaccine Information can be found at: [email or web address] For questions related to vaccine distribution or appointments, please email [email or web address] or call [phone].   

## 2022-01-01 LAB — PSA: Prostate Specific Ag, Serum: 1.3 ng/mL (ref 0.0–4.0)

## 2022-02-03 ENCOUNTER — Other Ambulatory Visit: Payer: Self-pay | Admitting: Urology

## 2022-02-03 ENCOUNTER — Encounter: Payer: Self-pay | Admitting: Urology

## 2022-02-03 MED ORDER — CLOBETASOL PROPIONATE 0.05 % EX OINT: 1.0000 | TOPICAL_OINTMENT | Freq: Two times a day (BID) | CUTANEOUS | 0 refills | Status: DC

## 2022-02-17 DIAGNOSIS — N35816 Other urethral stricture, male, overlapping sites: Secondary | ICD-10-CM | POA: Diagnosis not present

## 2022-02-21 ENCOUNTER — Ambulatory Visit (INDEPENDENT_AMBULATORY_CARE_PROVIDER_SITE_OTHER): Payer: Medicare HMO

## 2022-02-21 DIAGNOSIS — I428 Other cardiomyopathies: Secondary | ICD-10-CM

## 2022-02-21 LAB — CUP PACEART REMOTE DEVICE CHECK
Battery Remaining Longevity: 51 mo
Battery Remaining Percentage: 53 %
Battery Voltage: 2.95 V
Brady Statistic AP VP Percent: 1 %
Brady Statistic AP VS Percent: 4.3 %
Brady Statistic AS VP Percent: 2.3 %
Brady Statistic AS VS Percent: 92 %
Brady Statistic RA Percent Paced: 3.2 %
Brady Statistic RV Percent Paced: 3 %
Date Time Interrogation Session: 20231215040020
HighPow Impedance: 92 Ohm
HighPow Impedance: 92 Ohm
Implantable Lead Connection Status: 753985
Implantable Lead Connection Status: 753985
Implantable Lead Implant Date: 20190220
Implantable Lead Implant Date: 20190220
Implantable Lead Location: 753859
Implantable Lead Location: 753860
Implantable Lead Model: 7122
Implantable Pulse Generator Implant Date: 20190220
Lead Channel Impedance Value: 380 Ohm
Lead Channel Impedance Value: 460 Ohm
Lead Channel Pacing Threshold Amplitude: 0.75 V
Lead Channel Pacing Threshold Amplitude: 1 V
Lead Channel Pacing Threshold Pulse Width: 0.5 ms
Lead Channel Pacing Threshold Pulse Width: 0.5 ms
Lead Channel Sensing Intrinsic Amplitude: 1.1 mV
Lead Channel Sensing Intrinsic Amplitude: 11.8 mV
Lead Channel Setting Pacing Amplitude: 2 V
Lead Channel Setting Pacing Amplitude: 2.5 V
Lead Channel Setting Pacing Pulse Width: 0.5 ms
Lead Channel Setting Sensing Sensitivity: 0.5 mV
Pulse Gen Serial Number: 9787528

## 2022-03-13 NOTE — Progress Notes (Signed)
Remote ICD transmission.   

## 2022-05-14 ENCOUNTER — Ambulatory Visit: Payer: Medicare HMO | Admitting: Urology

## 2022-05-23 ENCOUNTER — Ambulatory Visit: Payer: Medicare HMO

## 2022-05-23 DIAGNOSIS — I428 Other cardiomyopathies: Secondary | ICD-10-CM | POA: Diagnosis not present

## 2022-05-23 LAB — CUP PACEART REMOTE DEVICE CHECK
Battery Remaining Longevity: 48 mo
Battery Remaining Percentage: 50 %
Battery Voltage: 2.95 V
Brady Statistic AP VP Percent: 1 %
Brady Statistic AP VS Percent: 5.9 %
Brady Statistic AS VP Percent: 2.8 %
Brady Statistic AS VS Percent: 89 %
Brady Statistic RA Percent Paced: 4.2 %
Brady Statistic RV Percent Paced: 3.6 %
Date Time Interrogation Session: 20240315050017
HighPow Impedance: 75 Ohm
HighPow Impedance: 75 Ohm
Implantable Lead Connection Status: 753985
Implantable Lead Connection Status: 753985
Implantable Lead Implant Date: 20190220
Implantable Lead Implant Date: 20190220
Implantable Lead Location: 753859
Implantable Lead Location: 753860
Implantable Lead Model: 7122
Implantable Pulse Generator Implant Date: 20190220
Lead Channel Impedance Value: 350 Ohm
Lead Channel Impedance Value: 390 Ohm
Lead Channel Pacing Threshold Amplitude: 0.75 V
Lead Channel Pacing Threshold Amplitude: 1 V
Lead Channel Pacing Threshold Pulse Width: 0.5 ms
Lead Channel Pacing Threshold Pulse Width: 0.5 ms
Lead Channel Sensing Intrinsic Amplitude: 1.2 mV
Lead Channel Sensing Intrinsic Amplitude: 11.8 mV
Lead Channel Setting Pacing Amplitude: 2 V
Lead Channel Setting Pacing Amplitude: 2.5 V
Lead Channel Setting Pacing Pulse Width: 0.5 ms
Lead Channel Setting Sensing Sensitivity: 0.5 mV
Pulse Gen Serial Number: 9787528

## 2022-06-12 ENCOUNTER — Telehealth: Payer: Self-pay

## 2022-06-12 NOTE — Telephone Encounter (Signed)
He's due for annual follow-up with me. Please schedule OV with PVR, IPSS.

## 2022-06-12 NOTE — Telephone Encounter (Signed)
Message left on VM stating that [facility] needs an addend note from [place] with diagnosis code. They state that note send from March did not have catheter dx. Please fax to them at [phone].

## 2022-06-13 NOTE — Telephone Encounter (Signed)
.  left message to have patient return my call.  

## 2022-06-13 NOTE — Telephone Encounter (Signed)
Patient returned call and scheduled annual follow up visit with Carman Ching.

## 2022-06-17 ENCOUNTER — Ambulatory Visit: Payer: Medicare HMO | Admitting: Physician Assistant

## 2022-06-17 VITALS — BP 145/83 | HR 69 | Ht 70.0 in | Wt 183.0 lb

## 2022-06-17 DIAGNOSIS — N35816 Other urethral stricture, male, overlapping sites: Secondary | ICD-10-CM

## 2022-06-17 DIAGNOSIS — N529 Male erectile dysfunction, unspecified: Secondary | ICD-10-CM

## 2022-06-17 DIAGNOSIS — N401 Enlarged prostate with lower urinary tract symptoms: Secondary | ICD-10-CM | POA: Diagnosis not present

## 2022-06-17 DIAGNOSIS — R350 Frequency of micturition: Secondary | ICD-10-CM | POA: Diagnosis not present

## 2022-06-17 LAB — BLADDER SCAN AMB NON-IMAGING: Scan Result: 43

## 2022-06-17 MED ORDER — TADALAFIL 20 MG PO TABS: ORAL_TABLET | ORAL | 2 refills | Status: DC

## 2022-06-17 NOTE — Progress Notes (Signed)
06/17/2022 11:35 AM   Leroy Merritt 10/01/1945 [id number]  CC: Chief Complaint  Patient presents with   Follow-up   HPI: Leroy Merritt is a 77 y.o. male with PMH BPH on Flomax and finasteride and multifocal urethral stricture managed with once weekly self-catheterization, and lichen sclerosus who presents today for annual follow-up.   Today he reports he remains on dual pharmacotherapy for his BPH, prescribed by his PCP, and his symptoms have been well-managed on this regimen.  He continues once weekly self-catheterization to keep the urethra patent and has had no difficulty with this.  He continues to use the 43 [place] coud size catheters.  With regard to his lichen sclerosus, he states that his foreskin remains mobile and he has no acute concerns related to this today.  Overall he denies dysuria, gross hematuria, and weak stream.  Additionally, he reports an approximate 6-year history of erectile dysfunction.  He reports rare spontaneous erections, never firm enough for penetration.  He states he tried Viagra and Cialis in the remote past, but seems to recall he had headaches with these.  He would be interested in trying them again.  He is curious about alternative therapies including topicals.  No history of chest pain, not on nitrates.  He is on sotalol and Entresto per Dr. Mariah Milling and eplerenone per Dr. Graciela Husbands.  IPSS 3/pleased as below.  PVR 43 mL.   IPSS     Row Name 06/17/22 1100         International Prostate Symptom Score   How often have you had the sensation of not emptying your bladder? Not at All     How often have you had to urinate less than every two hours? Less than 1 in 5 times     How often have you found you stopped and started again several times when you urinated? Less than 1 in 5 times     How often have you found it difficult to postpone urination? Not at All     How often have you had a weak urinary stream? Not at All     How often have you had to strain  to start urination? Not at All     How many times did you typically get up at night to urinate? 1 Time     Total IPSS Score 3       Quality of Life due to urinary symptoms   If you were to spend the rest of your life with your urinary condition just the way it is now how would you feel about that? 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 b.) TTE 03/13/2020: Ao root 72mm; Asc. Ao 49mm   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   Cataract    Dilated cardiomyopathy (HCC)    a.) LHC 04/28/2017: EF 35-35%. b.) TTE 04/29/2017: 30-35%. c.) TTE 03/22/2019: 50-55%. d.) TTE 03/13/2020: EF45-50%.   Diverticulosis 2017   Dysphagia 12/20/2014   Esophagitis    First degree AV block    GERD (gastroesophageal reflux disease)    HFrEF (  heart failure with reduced ejection fraction) (HCC) 04/29/2017   a.) TTE 04/29/2017: mod-sev LV dysfunction; EF 30-35%; global HK; G1DD. b.) Cardiac MRI 04/29/2017: biventricular/biatrial enlargement; LVEF 27%; global HK. c.) TTE 03/13/2020: EF 45-50%; global HK; mild LVH; GLS -12.7%; AoV sclerosis without stenosis; mild MR, triv TR; G1DD.   Hypercholesterolemia    ICD (implantable cardioverter-defibrillator) in place    a.) St. Judes device   NICM (nonischemic cardiomyopathy) (HCC)    a.) TTE 04/29/2017: EF 30-35%. b.) Cardiac MRI 04/29/2017: EF 27%.   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   Varicose veins of both lower extremities 12/20/2014    Ventricular tachycardia (HCC) 04/28/2017   a.) s/p St. Jude dual-chamber ICD placement   Surgical History: Past Surgical History:  Procedure Laterality Date   CARDIAC CATHETERIZATION  04/28/2017   CATARACT EXTRACTION W/ INTRAOCULAR LENS IMPLANT Right 2017   CATARACT EXTRACTION W/PHACO Left 05/20/2019   Procedure: CATARACT EXTRACTION PHACO AND INTRAOCULAR LENS PLACEMENT ([facility]) LEFT;  Surgeon: Galen Manila, MD;  Location: [facility];  Service: Ophthalmology;  Laterality: Left;  [place] 01:19.7 CDE 14.58 Fluid Pack Lot # [id number]   COLONOSCOPY     ESOPHAGOGASTRODUODENOSCOPY (EGD) WITH PROPOFOL N/A 02/19/2015   Procedure: ESOPHAGOGASTRODUODENOSCOPY (EGD) WITH PROPOFOL and esophageal dilation.;  Surgeon: Midge Minium, MD;  [facility];  Service: Endoscopy;  Laterality: N/A;   EYE SURGERY     ICD IMPLANT N/A 04/29/2017   Procedure: ICD IMPLANT;  Surgeon: Marinus Maw, MD;  Location: [facility] IN[facility];  Service: Cardiovascular;  Laterality: N/A;   KNEE ARTHROPLASTY Left 08/12/2021   Procedure: COMPUTER ASSISTED TOTAL KNEE ARTHROPLASTY;  Surgeon: Donato Heinz, MD;  Location: [facility];  Service: Orthopedics;  Laterality: Left;   LEFT HEART CATH AND CORONARY ANGIOGRAPHY N/A 04/28/2017   Procedure: LEFT HEART CATH AND CORONARY ANGIOGRAPHY;  Surgeon: [place]az[place], Peter M, MD;  Location: [facility] INVASIVE CV LAB;  Service: Cardiovascular;  Laterality: N/A;   PROSTATE BIOPSY  ~ 2012   RETINAL DETACHMENT SURGERY Right 1990s   "torn retina"   TOTAL HIP ARTHROPLASTY Right 03/20/2021   Procedure: TOTAL HIP ARTHROPLASTY;  Surgeon: Donato Heinz, MD;  Location: [facility];  Service: Orthopedics;  Laterality: Right;    Home Medications:  Allergies as of 06/17/2022   No Known Allergies      Medication List        Accurate as of June 17, 2022 11:35 AM. If you have any questions, ask your nurse or doctor.          clobetasol ointment 0.05 % Commonly known as: TEMOVATE Apply 1  Application topically 2 (two) times daily.   Entresto 49-51 MG Generic drug: sacubitril-valsartan TAKE ONE (1) TABLET BY MOUTH TWO TIMES PER DAY   eplerenone 25 MG tablet Commonly known as: INSPRA TAKE ONE TABLET BY MOUTH DAILY   finasteride 5 MG tablet Commonly known as: PROSCAR Take 1 tablet (5 mg total) by mouth at bedtime.   omeprazole 20 MG capsule Commonly known as: PRILOSEC Take 1 capsule (20 mg total) by mouth every morning.   sotalol 80 MG tablet Commonly known as: BETAPACE Take 1 tablet (80 mg total) by mouth 2 (two) times daily.   tamsulosin 0.4 MG Caps capsule Commonly known as: FLOMAX Take 1 capsule (0.4 mg total) by mouth daily after supper.  Allergies:  No Known Allergies  Family History: Family History  Problem Relation Age of Onset   Arthritis Mother    Diabetes Father    Hypertension Father    Heart disease Brother    Hyperlipidemia Brother    Hypertension Brother    Lung cancer Daughter    Cancer Daughter     Social History:   reports that he has never smoked. He has never been exposed to tobacco smoke. He has never used smokeless tobacco. He reports current alcohol use of about 1.0 standard drink of alcohol per week. He reports that he does not use drugs.  Physical Exam: BP (!) 145/83   Pulse 69   Ht 5\' 10"  (1.778 m)   Wt 183 lb (83 kg)   BMI 26.26 kg/m   Constitutional:  Alert and oriented, no acute distress, nontoxic appearing HEENT: [place], AT Cardiovascular: No clubbing, cyanosis, or edema Respiratory: Normal respiratory effort, no increased work of breathing Skin: No rashes, bruises or suspicious lesions Neurologic: Grossly intact, no focal deficits, moving all 4 extremities Psychiatric: Normal mood and affect  Laboratory Data: Results for orders placed or performed in visit on 06/17/22  BLADDER SCAN AMB NON-IMAGING  Result Value Ref Range   Scan Result 43 ml    Assessment & Plan:   1. Benign prostatic hyperplasia with  urinary frequency Well-controlled on dual pharmacotherapy, prescribed by his PCP.  Will continue to monitor.  2. 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 Chronic, well-managed with once weekly self-catheterization.  Will plan to continue once weekly CIC with 12 [place] Engineer, watercoud Coloplast SpeediCath Standard catheters. - BLADDER SCAN AMB NON-IMAGING  3. Erectile dysfunction, unspecified erectile dysfunction type Likely secondary to pharmacotherapy and underlying heart disease.  He is not on nitrates for chest pain.  We discussed low-dose daily tadalafil for cotreatment with BPH, but he declined this.  Will proceed with a trial of demand dose tadalafil.  Patient to follow-up as needed based on results.  We discussed that if he fails PDE 5 inhibitors, alternative therapies include intracavernosal injections or IPP placement.  He is not interested in pursuing IPP at this time. - tadalafil (CIALIS) 20 MG tablet; Take one tablet at least 60 minutes prior to sexual activity.  Dispense: 30 tablet; Refill: 2   Return in about 1 year (around 06/17/2023) for Annual f/u with SHIM, IPSS, PVR.  Carman ChingSamantha Ashton Sabine, PA-C  [facility] [place] [address], [address] [place], [place] [address] [phone]

## 2022-06-19 DIAGNOSIS — N35816 Other urethral stricture, male, overlapping sites: Secondary | ICD-10-CM | POA: Diagnosis not present

## 2022-06-19 DIAGNOSIS — R339 Retention of urine, unspecified: Secondary | ICD-10-CM | POA: Diagnosis not present

## 2022-06-19 DIAGNOSIS — R32 Unspecified urinary incontinence: Secondary | ICD-10-CM | POA: Diagnosis not present

## 2022-06-23 ENCOUNTER — Other Ambulatory Visit: Payer: Self-pay | Admitting: Family Medicine

## 2022-06-23 DIAGNOSIS — N401 Enlarged prostate with lower urinary tract symptoms: Secondary | ICD-10-CM

## 2022-06-23 NOTE — Progress Notes (Signed)
Remote ICD transmission.   

## 2022-06-24 NOTE — Telephone Encounter (Signed)
Requested Prescriptions  Pending Prescriptions Disp Refills   tamsulosin (FLOMAX) 0.4 MG CAPS capsule [Pharmacy Med Name: TAMSULOSIN HYDROCHLORIDE 0.4 MG Capsule] 90 capsule 1    Sig: TAKE 1 CAPSULE EVERY DAY     Urology: Alpha-Adrenergic Blocker Failed - 06/23/2022  4:08 PM      Failed - Last BP in normal range    BP Readings from Last 1 Encounters:  06/17/22 (!) 145/83         Passed - PSA in normal range and within 360 days    Prostate Specific Ag, Serum  Date Value Ref Range Status  12/31/2021 1.3 0.0 - 4.0 ng/mL Final    Comment:    Roche ECLIA methodology. According to the American Urological Association, Serum PSA should decrease and remain at undetectable levels after radical prostatectomy. The AUA defines biochemical recurrence as an initial PSA value 0.2 ng/mL or greater followed by a subsequent confirmatory PSA value 0.2 ng/mL or greater. Values obtained with different assay methods or kits cannot be used interchangeably. Results cannot be interpreted as absolute evidence of the presence or absence of malignant disease.          Passed - Valid encounter within last 12 months    Recent Outpatient Visits           6 months ago Routine general medical examination at a health care facility   [facility] [place], [place] P, DO   1 year ago Wellness examination   [place] [facility] [place], [place] P, DO   2 years ago Routine general medical examination at a health care facility   [facility], Megan P, DO   5 years ago Benign prostatic hyperplasia with urinary frequency    [facility] Steele Sizer, MD   6 years ago Benign prostatic hyperplasia with urinary frequency    [facility] [place], Redge Gainer, MD       Future Appointments             In 11 months Janalee Dane [facility]

## 2022-07-09 ENCOUNTER — Other Ambulatory Visit: Payer: Self-pay | Admitting: Cardiovascular Disease

## 2022-07-09 DIAGNOSIS — I428 Other cardiomyopathies: Secondary | ICD-10-CM

## 2022-07-09 DIAGNOSIS — I5022 Chronic systolic (congestive) heart failure: Secondary | ICD-10-CM

## 2022-07-18 DIAGNOSIS — N35816 Other urethral stricture, male, overlapping sites: Secondary | ICD-10-CM | POA: Diagnosis not present

## 2022-07-18 DIAGNOSIS — R339 Retention of urine, unspecified: Secondary | ICD-10-CM | POA: Diagnosis not present

## 2022-07-18 DIAGNOSIS — R32 Unspecified urinary incontinence: Secondary | ICD-10-CM | POA: Diagnosis not present

## 2022-08-05 ENCOUNTER — Other Ambulatory Visit: Payer: Self-pay | Admitting: Cardiovascular Disease

## 2022-08-05 DIAGNOSIS — I5022 Chronic systolic (congestive) heart failure: Secondary | ICD-10-CM

## 2022-08-05 DIAGNOSIS — I428 Other cardiomyopathies: Secondary | ICD-10-CM

## 2022-08-21 DIAGNOSIS — Z96652 Presence of left artificial knee joint: Secondary | ICD-10-CM | POA: Diagnosis not present

## 2022-08-21 DIAGNOSIS — Z96641 Presence of right artificial hip joint: Secondary | ICD-10-CM | POA: Diagnosis not present

## 2022-08-22 ENCOUNTER — Ambulatory Visit (INDEPENDENT_AMBULATORY_CARE_PROVIDER_SITE_OTHER): Payer: Medicare HMO

## 2022-08-22 DIAGNOSIS — I428 Other cardiomyopathies: Secondary | ICD-10-CM

## 2022-08-22 LAB — CUP PACEART REMOTE DEVICE CHECK
Battery Remaining Longevity: 46 mo
Battery Remaining Percentage: 48 %
Battery Voltage: 2.95 V
Brady Statistic AP VP Percent: 1.2 %
Brady Statistic AP VS Percent: 7.4 %
Brady Statistic AS VP Percent: 3.1 %
Brady Statistic AS VS Percent: 86 %
Brady Statistic RA Percent Paced: 5 %
Brady Statistic RV Percent Paced: 4.3 %
Date Time Interrogation Session: 20240614040018
HighPow Impedance: 75 Ohm
HighPow Impedance: 75 Ohm
Implantable Lead Connection Status: 753985
Implantable Lead Connection Status: 753985
Implantable Lead Implant Date: 20190220
Implantable Lead Implant Date: 20190220
Implantable Lead Location: 753859
Implantable Lead Location: 753860
Implantable Lead Model: 7122
Implantable Pulse Generator Implant Date: 20190220
Lead Channel Impedance Value: 380 Ohm
Lead Channel Impedance Value: 390 Ohm
Lead Channel Pacing Threshold Amplitude: 0.75 V
Lead Channel Pacing Threshold Amplitude: 1 V
Lead Channel Pacing Threshold Pulse Width: 0.5 ms
Lead Channel Pacing Threshold Pulse Width: 0.5 ms
Lead Channel Sensing Intrinsic Amplitude: 1.1 mV
Lead Channel Sensing Intrinsic Amplitude: 11.8 mV
Lead Channel Setting Pacing Amplitude: 2 V
Lead Channel Setting Pacing Amplitude: 2.5 V
Lead Channel Setting Pacing Pulse Width: 0.5 ms
Lead Channel Setting Sensing Sensitivity: 0.5 mV
Pulse Gen Serial Number: 9787528

## 2022-09-04 ENCOUNTER — Ambulatory Visit: Payer: Medicare HMO | Admitting: Cardiology

## 2022-09-05 ENCOUNTER — Other Ambulatory Visit: Payer: Self-pay

## 2022-09-05 DIAGNOSIS — I5022 Chronic systolic (congestive) heart failure: Secondary | ICD-10-CM

## 2022-09-05 DIAGNOSIS — I428 Other cardiomyopathies: Secondary | ICD-10-CM

## 2022-09-05 MED ORDER — ENTRESTO 49-51 MG PO TABS: 1.0000 | ORAL_TABLET | Freq: Two times a day (BID) | ORAL | 0 refills | Status: DC

## 2022-09-08 ENCOUNTER — Encounter: Payer: Self-pay | Admitting: Cardiology

## 2022-09-08 ENCOUNTER — Ambulatory Visit: Payer: Medicare HMO | Attending: Cardiology | Admitting: Cardiology

## 2022-09-08 ENCOUNTER — Other Ambulatory Visit
Admission: RE | Admit: 2022-09-08 | Discharge: 2022-09-08 | Disposition: A | Discharge status: Home / Self Care | Payer: Medicare HMO | Source: Ambulatory Visit | Attending: Cardiology | Admitting: Cardiology

## 2022-09-08 VITALS — BP 142/94 | HR 65 | Ht 70.0 in | Wt 182.0 lb

## 2022-09-08 DIAGNOSIS — Z95810 Presence of automatic (implantable) cardiac defibrillator: Secondary | ICD-10-CM

## 2022-09-08 DIAGNOSIS — Z79899 Other long term (current) drug therapy: Secondary | ICD-10-CM

## 2022-09-08 DIAGNOSIS — I5032 Chronic diastolic (congestive) heart failure: Secondary | ICD-10-CM | POA: Insufficient documentation

## 2022-09-08 DIAGNOSIS — I472 Ventricular tachycardia, unspecified: Secondary | ICD-10-CM

## 2022-09-08 DIAGNOSIS — I428 Other cardiomyopathies: Secondary | ICD-10-CM

## 2022-09-08 DIAGNOSIS — R0600 Dyspnea, unspecified: Secondary | ICD-10-CM

## 2022-09-08 DIAGNOSIS — R5383 Other fatigue: Secondary | ICD-10-CM | POA: Insufficient documentation

## 2022-09-08 LAB — CUP PACEART INCLINIC DEVICE CHECK
Battery Remaining Longevity: 49 mo
Brady Statistic RA Percent Paced: 5.1 %
Brady Statistic RV Percent Paced: 4.2 %
Date Time Interrogation Session: 20240701144807
HighPow Impedance: 76.5 Ohm
Implantable Lead Connection Status: 753985
Implantable Lead Connection Status: 753985
Implantable Lead Implant Date: 20190220
Implantable Lead Implant Date: 20190220
Implantable Lead Location: 753859
Implantable Lead Location: 753860
Implantable Lead Model: 7122
Implantable Pulse Generator Implant Date: 20190220
Lead Channel Impedance Value: 375 Ohm
Lead Channel Impedance Value: 400 Ohm
Lead Channel Pacing Threshold Amplitude: 0.75 V
Lead Channel Pacing Threshold Amplitude: 0.75 V
Lead Channel Pacing Threshold Amplitude: 1 V
Lead Channel Pacing Threshold Amplitude: 1 V
Lead Channel Pacing Threshold Pulse Width: 0.5 ms
Lead Channel Pacing Threshold Pulse Width: 0.5 ms
Lead Channel Pacing Threshold Pulse Width: 0.5 ms
Lead Channel Pacing Threshold Pulse Width: 0.5 ms
Lead Channel Sensing Intrinsic Amplitude: 1.3 mV
Lead Channel Sensing Intrinsic Amplitude: 11.8 mV
Lead Channel Setting Pacing Amplitude: 2 V
Lead Channel Setting Pacing Amplitude: 2.5 V
Lead Channel Setting Pacing Pulse Width: 0.5 ms
Lead Channel Setting Sensing Sensitivity: 0.5 mV
Pulse Gen Serial Number: 9787528

## 2022-09-08 LAB — BASIC METABOLIC PANEL
Anion gap: 6 (ref 5–15)
BUN: 19 mg/dL (ref 8–23)
CO2: 23 mmol/L (ref 22–32)
Calcium: 8.7 mg/dL — ABNORMAL LOW (ref 8.9–10.3)
Chloride: 110 mmol/L (ref 98–111)
Creatinine, Ser: 1.25 mg/dL — ABNORMAL HIGH (ref 0.61–1.24)
GFR, Estimated: 59 mL/min — ABNORMAL LOW (ref >60)
Glucose, Bld: 105 mg/dL — ABNORMAL HIGH (ref 70–99)
Potassium: 4.2 mmol/L (ref 3.5–5.1)
Sodium: 139 mmol/L (ref 135–145)

## 2022-09-08 LAB — MAGNESIUM: Magnesium: 2.3 mg/dL (ref 1.7–2.4)

## 2022-09-08 NOTE — Patient Instructions (Addendum)
Medication Instructions:  Your physician recommends that you continue on your current medications as directed. Please refer to the Current Medication list given to you today.  *If you need a refill on your cardiac medications before your next appointment, please call your pharmacy*   Lab Work: BMP and magnesium level - Please go to the [facility]. You will check in at the front desk to the right as you walk into the atrium. Valet Parking is offered if needed. - No appointment needed. You may go any day between 7 am and 6 pm.  If you have labs (blood work) drawn today and your tests are completely normal, you will receive your results only by: MyChart Message (if you have MyChart) OR A paper copy in the mail If you have any lab test that is abnormal or we need to change your treatment, we will call you to review the results.   Testing/Procedures: Your physician has requested that you have an echocardiogram in 3-4 weeks. Echocardiography is a painless test that uses sound waves to create images of your heart. It provides your doctor with information about the size and shape of your heart and how well your heart's chambers and valves are working. This procedure takes approximately one hour. There are no restrictions for this procedure. Please do NOT wear cologne, perfume, aftershave, or lotions (deodorant is allowed). Please arrive 15 minutes prior to your appointment time.   Follow-Up: At [facility], you and your health needs are our priority.  As part of our continuing mission to provide you with exceptional heart care, we have created designated Provider Care Teams.  These Care Teams include your primary Cardiologist (physician) and Advanced Practice Providers (APPs -  Physician Assistants and Nurse Practitioners) who all work together to provide you with the care you need, when you need it.  We recommend signing up for the patient portal called "MyChart".  Sign up  information is provided on this After Visit Summary.  MyChart is used to connect with patients for Virtual Visits (Telemedicine).  Patients are able to view lab/test results, encounter notes, upcoming appointments, etc.  Non-urgent messages can be sent to your provider as well.   To learn more about what you can do with MyChart, go to [email or web address].    Your next appointment:   1 year(s)  Provider:   Sherryl Manges, MD or Sherie Don, NP

## 2022-09-08 NOTE — Progress Notes (Signed)
Cardiology Office Note Date:  09/08/2022  Patient ID:  Leroy Merritt, Leroy Merritt 05/14/1945, MRN [id number] PCP:  Dorcas Carrow, DO  Cardiologist:  Julien Nordmann, MD Electrophysiologist: Sherryl Manges, MD    Chief Complaint: 1 year ICD follow-up  History of Present Illness: Leroy Merritt is a 77 y.o. male with PMH notable for NICM, VT s/p ICD, PVCs; seen today for Sherryl Manges, MD for routine electrophysiology followup.   He last saw Dr. Graciela Husbands 08/2021, PVC burden was remaining low so sotalol continued. Discussed cost of entresto.  He saw Dr. Mariah Milling 12/2021, was doing well.  On follow-up today, he feels well. He has noticed that he gets SOB and fatigued with lots of activity, like yard work. He is able to easily climb 1-2 flights of stairs, but becomes winded with 3-4 flights. He is able to easily grocery shop and bring in groceries without symptoms.   He ran out of entresto last week, delayed getting refill and pharmacy was out of medication. He has since picked up medication and has resumed taking it BID.  He does not have a BP cuff at home, but is able to get one.  BP in medical offices typical runs 120-130/70s.   he denies chest pain, palpitations, , PND, orthopnea, nausea, vomiting, dizziness, syncope, edema, weight gain, or early satiety.    Device Information: St. 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 b.) TTE 03/13/2020: Ao root 39mm; Asc. 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   Cataract    Dilated cardiomyopathy (HCC)    a.) LHC 04/28/2017: EF 35-35%. b.) TTE 04/29/2017:  30-35%. c.) TTE 03/22/2019: 50-55%. d.) TTE 03/13/2020: EF45-50%.   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 b.) Cardiac MRI 04/29/2017: biventricular/biatrial enlargement; LVEF 27%; global HK. c.) TTE 03/13/2020: EF 45-50%; global HK; mild LVH; GLS -12.7%; AoV sclerosis without stenosis; mild MR, triv TR; G1DD.   Hypercholesterolemia    ICD (implantable cardioverter-defibrillator) in place    a.) St. Judes device   NICM (nonischemic cardiomyopathy) (HCC)    a.) TTE 04/29/2017: EF 30-35%. b.) Cardiac MRI 04/29/2017: EF 27%.   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 Jude dual-chamber ICD placement    Past Surgical History:  Procedure Laterality Date   CARDIAC CATHETERIZATION  04/28/2017   CATARACT EXTRACTION W/ INTRAOCULAR LENS IMPLANT Right 2017   CATARACT EXTRACTION W/PHACO Left 05/20/2019   Procedure: CATARACT EXTRACTION PHACO AND INTRAOCULAR LENS PLACEMENT (IOC) LEFT;  Surgeon: Galen Manila, MD;  Location: [facility];  Service: Ophthalmology;  Laterality: Left;  [place] 01:19.7 CDE 14.58 Fluid Pack Lot # [id number]   COLONOSCOPY     ESOPHAGOGASTRODUODENOSCOPY (EGD) WITH PROPOFOL N/A 02/19/2015   Procedure: ESOPHAGOGASTRODUODENOSCOPY (EGD) WITH PROPOFOL and esophageal dilation.;  Surgeon: Midge Minium, MD;  Location: [facility];  Service: Endoscopy;  Laterality: N/A;   EYE SURGERY     ICD IMPLANT N/A 04/29/2017   Procedure: ICD IMPLANT;  Surgeon: Marinus Maw,  MD;  Location: [facility] INVASIVE CV LAB;  Service: Cardiovascular;  Laterality: N/A;   KNEE ARTHROPLASTY Left 08/12/2021   Procedure: COMPUTER ASSISTED TOTAL KNEE ARTHROPLASTY;  Surgeon: Donato Heinz, MD;  Location: [facility];  Service: Orthopedics;  Laterality: Left;   LEFT HEART CATH AND CORONARY ANGIOGRAPHY N/A 04/28/2017   Procedure: LEFT HEART CATH AND CORONARY ANGIOGRAPHY;  Surgeon: [place], Peter M, MD;  Location: [facility] INVASIVE CV LAB;  Service: Cardiovascular;  Laterality: N/A;   PROSTATE BIOPSY  ~ 2012   RETINAL DETACHMENT SURGERY Right 1990s   "torn retina"   TOTAL HIP ARTHROPLASTY Right 03/20/2021   Procedure: TOTAL HIP ARTHROPLASTY;  Surgeon: Donato Heinz, MD;  Location: [facility];  Service: Orthopedics;  Laterality: Right;    Current Outpatient Medications  Medication Instructions   clobetasol ointment (TEMOVATE) 0.05 % 1 Application, Topical, 2 times daily   eplerenone (INSPRA) 25 MG tablet TAKE ONE TABLET BY MOUTH DAILY   finasteride (PROSCAR) 5 mg, Oral, Daily at bedtime   omeprazole (PRILOSEC) 20 mg, Oral, BH-each morning   sacubitril-valsartan (ENTRESTO) 49-51 MG 1 tablet, Oral, 2 times daily   sotalol (BETAPACE) 80 mg, Oral, 2 times daily   tadalafil (CIALIS) 20 MG tablet Take one tablet at least 60 minutes prior to sexual activity.   tamsulosin (FLOMAX) 0.4 mg, Oral, Daily    Social History:  The patient  reports that he has never smoked. He has never been exposed to tobacco smoke. He has never used smokeless tobacco. He reports current alcohol use of about 1.0 standard drink of alcohol per week. He reports that he does not use drugs.   Family History:   The patient's family history includes Arthritis in his mother; Cancer in his daughter; Diabetes in his father; Heart disease in his brother; Hyperlipidemia in his brother; Hypertension in his brother and father; Lung cancer in his daughter.  ROS:  Please see the history of present illness. All other systems are reviewed and  otherwise negative.   PHYSICAL EXAM:  VS:  BP (!) 142/94 (BP Location: Left Arm, Patient Position: Sitting, Cuff Size: Normal)   Pulse 65   Ht 5\' 10"  (1.778 m)   Wt 182 lb (82.6 kg)   SpO2 98%   BMI 26.11 kg/m  BMI: Body mass index is 26.11 kg/m.  GEN- The patient is well appearing, alert and oriented x 3 today.   Lungs- Clear to ausculation bilaterally, normal work of breathing.  Heart- Irregularly irregular rate and rhythm, no murmurs, rubs or gallops Extremities- No peripheral edema, warm, dry Skin-  device pocket well-healed, no tethering  Device interrogation done today and reviewed by myself:  Battery 3.5 years Lead thresholds, impedence, sensing stable  PVC burden 2.7% One brief NSVT episode in Aug 2023 Somewhat frequent AMS episodes 4-8 seconds- all appear to be atrial competitive pacing with intrinsic atrial activity  Adjusted sensor threshold from -0.5 to +2.0  EKG is ordered. Personal review of EKG from today shows:   EKG Interpretation Date/Time:  Monday September 08 2022 13:01:08 EDT Ventricular Rate:  65 PR Interval:  278 QRS Duration:  94 QT Interval:  436 QTC Calculation: 453 R Axis:   -56  Text Interpretation: Atrial-paced rhythm with prolonged AV conduction with frequent Premature ventricular complexes Left axis deviation Confirmed by Sherie Don [phone]) on 09/08/2022 1:07:38 PM    Recent Labs: 12/23/2021: ALT 9; BUN 15; Creatinine, Ser 1.15; Hemoglobin 14.5; Magnesium 2.2; Platelets 165; Potassium 4.4; Sodium 141; TSH 1.210  12/23/2021: Cholesterol, Total 174; HDL 47; LDL Chol Calc (NIH) 108; Triglycerides 104   CrCl cannot be calculated (Patient's most recent lab result is older than the maximum 21 days allowed.).   Wt Readings from Last 3 Encounters:  09/08/22 182 lb (82.6 kg)  06/17/22 183 lb (83 kg)  12/31/21 183 lb 8 oz (83.2 kg)     Additional studies reviewed include: Previous EP, cardiology notes.   TTE, 03/13/2020  1. Left ventricular  ejection fraction, by estimation, is 45 to 50%. The left ventricle has mildly decreased function. The left ventricle demonstrates global hypokinesis. There is mild left ventricular hypertrophy. Left ventricular diastolic parameters are consistent with Grade I diastolic dysfunction (impaired relaxation). The average left ventricular global longitudinal strain is -12.7 %. The global longitudinal strain is abnormal.   2. Right ventricular systolic function is normal. The right ventricular size is normal. There is normal pulmonary artery systolic pressure.   3. The mitral valve is normal in structure. Mild mitral valve regurgitation. No evidence of mitral stenosis.   4. The aortic valve has an indeterminant number of cusps. There is mild thickening of the aortic valve. Aortic valve regurgitation is not visualized. Mild aortic valve sclerosis is present, with no evidence of aortic valve stenosis.   5. Aortic dilatation noted. 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   TTE, 03/22/2019 1. Left ventricular ejection fraction, by visual estimation, is 50 to 55%. The left ventricle has low normal function. There is no left ventricular hypertrophy.   2. 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   3. Left ventricular diastolic parameters are indeterminate.   4. The left ventricle has no regional wall motion abnormalities.   5. Global right ventricle has mildly reduced systolic function.The right ventricular size is normal. No increase in right ventricular wall thickness.   6. Right ventricular systolic pressure is normal (20-25 mmHg plus central venous pressure).   7. Left atrial size was normal.   8. Right atrial size was normal.   9. The pericardium was not well visualized.  10. The mitral valve is grossly normal. Mild to moderate mitral valve regurgitation.  11. The tricuspid valve is not well visualized.  12.  The aortic valve is tricuspid. Aortic valve regurgitation is not visualized. No evidence of aortic valve sclerosis or stenosis.  13. The pulmonic valve was not well visualized. Pulmonic valve regurgitation is trivial.  14. Aortic dilatation noted.  15. There is mild dilatation of the aortic root measuring 39 mm.  16. A pacer wire is visualized in the RV.  17. The interatrial septum was not well visualized.   TTE, 04/29/2017 - Left ventricle: The cavity size was normal. Wall thickness was normal. Systolic function was moderately to severely reduced. The estimated ejection fraction was in the range of 30% to 35%. Diffuse hypokinesis. Doppler parameters are consistent with abnormal left ventricular relaxation (grade 1 diastolic dysfunction). - Ventricular septum: Septal motion showed abnormal function and dyssynergy. - Aortic valve: Trileaflet; mildly thickened, mildly  calcified leaflets.  ASSESSMENT AND PLAN:  #) VT s/p St. Jude ICD Device working appropriately, see paceart Frequent, brief episodes of AMS with atrial competitive pacing with activity sensor Adjusted sensor threshold from -0.5 > +2.0  #) NICM #) HFimpEF #) SOB, fatigue NYHA II symptoms, though having SOB and increased fatigue with yard activities Warm and dry on exam Last echo 2022 with LVEF 45-50% - will update echo GDMT: entresto, eplerenone, sotalol Did not tolerate farxiga Diuretic: none  #) HTN HTN in office today, though patient has missed several recent doses of entresto Recommended he obtain BP cuff and check BP 3-4 times / week.  - update BMP and Mag today - consider up-titration of entresto if BP remains elevated   Current medicines are reviewed at length with the patient today.   The patient does not have concerns regarding his medicines.  The following changes were made today:  none  Labs/ tests ordered today include:  Orders Placed This Encounter  Procedures   Basic Metabolic Panel (BMET)    Magnesium   EKG 12-Lead   ECHOCARDIOGRAM COMPLETE     Disposition: Follow up with Dr. Graciela Husbands or EP APP in 12 months; follow-up with gen cards in ~80months.   Signed, Sherie Don, NP  09/08/22  2:18 PM  Electrophysiology [facility]

## 2022-09-09 NOTE — Progress Notes (Signed)
Remote ICD transmission.   

## 2022-09-29 ENCOUNTER — Other Ambulatory Visit: Payer: Self-pay | Admitting: Cardiovascular Disease

## 2022-09-29 DIAGNOSIS — I472 Ventricular tachycardia, unspecified: Secondary | ICD-10-CM

## 2022-09-30 ENCOUNTER — Ambulatory Visit: Payer: Medicare HMO

## 2022-09-30 DIAGNOSIS — R0600 Dyspnea, unspecified: Secondary | ICD-10-CM | POA: Diagnosis not present

## 2022-09-30 DIAGNOSIS — R5383 Other fatigue: Secondary | ICD-10-CM | POA: Diagnosis not present

## 2022-09-30 LAB — ECHOCARDIOGRAM COMPLETE
Area-P 1/2: 2.29 cm2
Calc EF: 47.5 %
S' Lateral: 3.9 cm
Single Plane A2C EF: 47.3 %
Single Plane A4C EF: 47.7 %

## 2022-10-01 ENCOUNTER — Other Ambulatory Visit: Payer: Self-pay | Admitting: Family Medicine

## 2022-10-01 DIAGNOSIS — N401 Enlarged prostate with lower urinary tract symptoms: Secondary | ICD-10-CM

## 2022-10-02 NOTE — Telephone Encounter (Signed)
Requested Prescriptions  Refused Prescriptions Disp Refills   tamsulosin (FLOMAX) 0.4 MG CAPS capsule [Pharmacy Med Name: TAMSULOSIN HYDROCHLORIDE 0.4 MG Capsule] 90 capsule 3    Sig: TAKE 1 CAPSULE EVERY DAY     Urology: Alpha-Adrenergic Blocker Failed - 10/01/2022  9:07 AM      Failed - Last BP in normal range    BP Readings from Last 1 Encounters:  09/08/22 (!) 142/94         Passed - PSA in normal range and within 360 days    Prostate Specific Ag, Serum  Date Value Ref Range Status  12/31/2021 1.3 0.0 - 4.0 ng/mL Final    Comment:    Roche ECLIA methodology. According to the American Urological Association, Serum PSA should decrease and remain at undetectable levels after radical prostatectomy. The AUA defines biochemical recurrence as an initial PSA value 0.2 ng/mL or greater followed by a subsequent confirmatory PSA value 0.2 ng/mL or greater. Values obtained with different assay methods or kits cannot be used interchangeably. Results cannot be interpreted as absolute evidence of the presence or absence of malignant disease.          Passed - Valid encounter within last 12 months    Recent Outpatient Visits           9 months ago Routine general medical examination at a health care facility   [facility] [place], [place] P, DO   1 year ago Wellness examination   [facility] [place], [place] P, DO   3 years ago Routine general medical examination at a health care facility   [facility], Megan P, DO   5 years ago Benign prostatic hyperplasia with urinary frequency   [facility] Steele Sizer, MD   6 years ago Benign prostatic hyperplasia with urinary frequency   [facility] [place], Redge Gainer, MD       Future Appointments             In 5 months Gollan, Tollie Pizza, MD [facility] [place]   In 8 months Janalee Dane [facility] Urology [facility]

## 2022-10-05 ENCOUNTER — Other Ambulatory Visit: Payer: Self-pay | Admitting: Internal Medicine

## 2022-11-21 ENCOUNTER — Ambulatory Visit (INDEPENDENT_AMBULATORY_CARE_PROVIDER_SITE_OTHER): Payer: Medicare HMO

## 2022-11-21 DIAGNOSIS — I472 Ventricular tachycardia, unspecified: Secondary | ICD-10-CM

## 2022-11-21 LAB — CUP PACEART REMOTE DEVICE CHECK
Battery Remaining Longevity: 44 mo
Battery Remaining Percentage: 45 %
Battery Voltage: 2.93 V
Brady Statistic AP VP Percent: 1 %
Brady Statistic AP VS Percent: 9.1 %
Brady Statistic AS VP Percent: 2.5 %
Brady Statistic AS VS Percent: 84 %
Brady Statistic RA Percent Paced: 3 %
Brady Statistic RV Percent Paced: 3 %
Date Time Interrogation Session: 20240913040016
HighPow Impedance: 74 Ohm
HighPow Impedance: 74 Ohm
Implantable Lead Connection Status: 753985
Implantable Lead Connection Status: 753985
Implantable Lead Implant Date: 20190220
Implantable Lead Implant Date: 20190220
Implantable Lead Location: 753859
Implantable Lead Location: 753860
Implantable Lead Model: 7122
Implantable Pulse Generator Implant Date: 20190220
Lead Channel Impedance Value: 380 Ohm
Lead Channel Impedance Value: 400 Ohm
Lead Channel Pacing Threshold Amplitude: 0.75 V
Lead Channel Pacing Threshold Amplitude: 1 V
Lead Channel Pacing Threshold Pulse Width: 0.5 ms
Lead Channel Pacing Threshold Pulse Width: 0.5 ms
Lead Channel Sensing Intrinsic Amplitude: 1.4 mV
Lead Channel Sensing Intrinsic Amplitude: 11.8 mV
Lead Channel Setting Pacing Amplitude: 2 V
Lead Channel Setting Pacing Amplitude: 2.5 V
Lead Channel Setting Pacing Pulse Width: 0.5 ms
Lead Channel Setting Sensing Sensitivity: 0.5 mV
Pulse Gen Serial Number: 9787528

## 2022-11-27 NOTE — Progress Notes (Signed)
Remote ICD transmission.   

## 2022-12-10 ENCOUNTER — Other Ambulatory Visit: Payer: Self-pay | Admitting: Cardiovascular Disease

## 2022-12-10 DIAGNOSIS — I428 Other cardiomyopathies: Secondary | ICD-10-CM

## 2022-12-10 DIAGNOSIS — I5022 Chronic systolic (congestive) heart failure: Secondary | ICD-10-CM

## 2022-12-16 ENCOUNTER — Other Ambulatory Visit: Payer: Self-pay | Admitting: Cardiovascular Disease

## 2022-12-16 DIAGNOSIS — I472 Ventricular tachycardia, unspecified: Secondary | ICD-10-CM

## 2022-12-26 ENCOUNTER — Other Ambulatory Visit: Payer: Self-pay | Admitting: Family Medicine

## 2022-12-26 DIAGNOSIS — N401 Enlarged prostate with lower urinary tract symptoms: Secondary | ICD-10-CM

## 2022-12-27 ENCOUNTER — Ambulatory Visit
Admission: EM | Admit: 2022-12-27 | Discharge: 2022-12-27 | Disposition: A | Discharge status: Home / Self Care | Payer: Medicare HMO | Attending: Physician Assistant | Admitting: Physician Assistant

## 2022-12-27 DIAGNOSIS — B029 Zoster without complications: Secondary | ICD-10-CM

## 2022-12-27 MED ORDER — VALACYCLOVIR HCL 1 G PO TABS
1000.0000 mg | ORAL_TABLET | Freq: Three times a day (TID) | ORAL | 0 refills | Status: AC
Start: 2022-12-27 — End: 2023-01-03

## 2022-12-27 NOTE — ED Provider Notes (Signed)
Leroy Merritt    CSN: [id number] Arrival date & time: 12/27/22  4132      History   Chief Complaint Chief Complaint  Patient presents with   Rash    HPI UMAIR Merritt is a 77 y.o. male.   Patient presents today with a 4-day history of rash.  Denies any associated pain or pruritus.  Does report mild discomfort if the area is touched such as when he wears a seatbelt but otherwise is not particularly bothersome.  He has applied hydrocortisone cream without improvement of symptoms.  Denies any changes to personal hygiene products including soaps or detergents.  Denies any medication changes.  Denies episodes of similar symptoms in the past or history of dermatological condition.  Denies any fever, nausea, vomiting.  Denies any known sick contacts with similar symptoms.  Rash has not spread since it began.    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 b.) TTE 03/13/2020: Ao root 39mm; Asc. 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   Cataract    Dilated cardiomyopathy (HCC)    a.) LHC 04/28/2017: EF 35-35%. b.) TTE 04/29/2017: 30-35%. c.) TTE 03/22/2019: 50-55%. d.) TTE 03/13/2020: EF45-50%.   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 b.) Cardiac MRI 04/29/2017: biventricular/biatrial enlargement; LVEF 27%; global HK. c.) TTE 03/13/2020: EF 45-50%; global HK; mild LVH; GLS -12.7%; AoV sclerosis without stenosis; mild MR, triv  TR; G1DD.   Hypercholesterolemia    ICD (implantable cardioverter-defibrillator) in place    a.) [employer]. Judes device   NICM (nonischemic cardiomyopathy) (HCC)    a.) TTE 04/29/2017: EF 30-35%. b.) Cardiac MRI 04/29/2017: EF 27%.   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   Hx of total hip arthroplasty, right 03/20/2021   Primary osteoarthritis of right hip 12/23/2020   First degree AV block 08/29/2019   Chronic systolic heart failure (HCC) 09/16/2018   ICD (implantable cardioverter-defibrillator) in place 09/16/2018   Ventricular tachycardia (HCC) 04/28/2017   Near syncope 04/28/2017   Dilated cardiomyopathy (HCC) 04/28/2017   Advanced care planning/counseling discussion 03/17/2017   Palpitations 03/17/2017   Reflux esophagitis    Stricture and stenosis of esophagus    BPH (benign prostatic hyperplasia) 12/20/2014   Varicose veins of both lower extremities 12/20/2014    Past Surgical History:  Procedure Laterality Date   CARDIAC CATHETERIZATION  04/28/2017   CATARACT EXTRACTION W/ INTRAOCULAR LENS IMPLANT Right 2017   CATARACT EXTRACTION W/PHACO  Left 05/20/2019   Procedure: CATARACT EXTRACTION PHACO AND INTRAOCULAR LENS PLACEMENT (IOC) LEFT;  Surgeon: Galen Manila, MD;  Location: [facility];  Service: Ophthalmology;  Laterality: Left;  [place] 01:19.7 CDE 14.58 Fluid Pack Lot # [id number]   COLONOSCOPY     ESOPHAGOGASTRODUODENOSCOPY (EGD) WITH PROPOFOL N/A 02/19/2015   Procedure:  ESOPHAGOGASTRODUODENOSCOPY (EGD) WITH PROPOFOL and esophageal dilation.;  Surgeon: Midge Minium, MD;  Location: [facility];  Service: Endoscopy;  Laterality: N/A;   EYE SURGERY     ICD IMPLANT N/A 04/29/2017   Procedure: ICD IMPLANT;  Surgeon: Marinus Maw, MD;  Location: [facility] INVASIVE CV LAB;  Service: Cardiovascular;  Laterality: N/A;   KNEE ARTHROPLASTY Left 08/12/2021   Procedure: COMPUTER ASSISTED TOTAL KNEE ARTHROPLASTY;  Surgeon: Donato Heinz, MD;  Location: [facility];  Service: Orthopedics;  Laterality: Left;   LEFT HEART CATH AND CORONARY ANGIOGRAPHY N/A 04/28/2017   Procedure: LEFT HEART CATH AND CORONARY ANGIOGRAPHY;  Surgeon: [place], Peter M, MD;  Location: [facility] INVASIVE CV LAB;  Service: Cardiovascular;  Laterality: N/A;   PROSTATE BIOPSY  ~ 2012   RETINAL DETACHMENT SURGERY Right 1990s   "torn retina"   TOTAL HIP ARTHROPLASTY Right 03/20/2021   Procedure: TOTAL HIP ARTHROPLASTY;  Surgeon: Donato Heinz, MD;  Location: [facility];  Service: Orthopedics;  Laterality: Right;       Home Medications    Prior to Admission medications   Medication Sig Start Date End Date Taking? Authorizing Provider  valACYclovir (VALTREX) 1000 MG tablet Take 1 tablet (1,000 mg total) by mouth 3 (three) times daily for 7 days. 12/27/22 01/03/23 Yes Nanna Ertle, Denny Peon K, PA-C  clobetasol ointment (TEMOVATE) 0.05 % Apply 1 Application topically 2 (two) times daily. 02/03/22   Vanna Scotland, MD  eplerenone (INSPRA) 25 MG tablet TAKE 1 TABLET BY MOUTH DAILY 10/07/22   Duke Salvia, MD  finasteride (PROSCAR) 5 MG tablet Take 1 tablet (5 mg total) by mouth at bedtime. 12/23/21   Johnson, Megan P, DO  omeprazole (PRILOSEC) 20 MG capsule Take 1 capsule (20 mg total) by mouth every morning. 12/23/21   Johnson, Megan P, DO  sacubitril-valsartan (ENTRESTO) 49-51 MG TAKE 1 TABLET TWICE DAILY 12/11/22   Antonieta Iba, MD  sotalol (BETAPACE) 80 MG tablet Take 1 tablet (80 mg total) by mouth 2 (two) times  daily. Keep scheduled appointment for further refills. 12/17/22   Antonieta Iba, MD  tadalafil (CIALIS) 20 MG tablet Take one tablet at least 60 minutes prior to sexual activity. 06/17/22   Carman Ching, PA-C  tamsulosin (FLOMAX) 0.4 MG CAPS capsule TAKE 1 CAPSULE EVERY DAY 06/24/22   Dorcas Carrow, DO    Family History Family History  Problem Relation Age of Onset   Arthritis Mother    Diabetes Father    Hypertension Father    Heart disease Brother    Hyperlipidemia Brother    Hypertension Brother    Lung cancer Daughter    Cancer Daughter     Social History Social History   Tobacco Use   Smoking status: Never    Passive exposure: Never   Smokeless tobacco: Never  Vaping Use   Vaping status: Never Used  Substance Use Topics   Alcohol use: Yes    Alcohol/week: 1.0 standard drink of alcohol    Types: 1 Glasses of wine per week    Comment: 1 Drink/Week   Drug use: No     Allergies   Patient has no known allergies.  Review of Systems Review of Systems  Constitutional:  Negative for activity change, appetite change, fatigue and fever.  Gastrointestinal:  Negative for abdominal pain, diarrhea, nausea and vomiting.  Musculoskeletal:  Negative for arthralgias and myalgias.  Skin:  Positive for rash. Negative for wound.     Physical Exam Triage Vital Signs ED Triage Vitals  Encounter Vitals Group     BP 12/27/22 0943 123/87     Systolic BP Percentile --      Diastolic BP Percentile --      Pulse Rate 12/27/22 0943 71     Resp 12/27/22 0943 15     Temp 12/27/22 0943 97.6 F (36.4 C)     Temp src --      SpO2 12/27/22 0943 98 %     Weight --      Height --      Head Circumference --      Peak Flow --      Pain Score 12/27/22 0938 0     Pain Loc --      Pain Education --      Exclude from Growth Chart --    No data found.  Updated Vital Signs BP 123/87   Pulse 71   Temp 97.6 F (36.4 C)   Resp 15   SpO2 98%   Visual Acuity Right Eye  Distance:   Left Eye Distance:   Bilateral Distance:    Right Eye Near:   Left Eye Near:    Bilateral Near:     Physical Exam Vitals reviewed.  Constitutional:      General: He is awake.     Appearance: Normal appearance. He is well-developed. He is not ill-appearing.     Comments: Very pleasant male appears stated age in no acute distress sitting comfortably in exam room  HENT:     Head: Normocephalic and atraumatic.  Cardiovascular:     Rate and Rhythm: Normal rate and regular rhythm.     Heart sounds: Normal heart sounds, S1 normal and S2 normal. No murmur heard. Pulmonary:     Effort: Pulmonary effort is normal.     Breath sounds: Normal breath sounds. No stridor. No wheezing, rhonchi or rales.     Comments: Clear to auscultation bilaterally Skin:    Findings: Rash present. Rash is macular, papular and vesicular.     Comments: Maculopapular rash with vesicular patches noted along left neck and anterior chest wall in C4/C5 distribution.  Rash does not cross midline.  No bleeding or drainage noted.  No additional areas of rash noted on exam.  Neurological:     Mental Status: He is alert.  Psychiatric:        Behavior: Behavior is cooperative.       UC Treatments / Results  Labs (all labs ordered are listed, but only abnormal results are displayed) Labs Reviewed - No data to display  EKG   Radiology No results found.  Procedures Procedures (including critical care time)  Medications Ordered in UC Medications - No data to display  Initial Impression / Assessment and Plan / UC Course  I have reviewed the triage vital signs and the nursing notes.  Pertinent labs & imaging results that were available during my care of the patient were reviewed by me and considered in my medical decision making (see chart for details).     Patient is well-appearing, afebrile, nontoxic, nontachycardic.  Physical exam is consistent with shingles.  Patient is outside the window  of  when we typically begin antiviral therapy but given his age and suspected immunosuppression will start Valtrex 1000 mg 3 times daily for 1 week.  No indication for dose adjustment based on metabolic panel from 09/08/2022 with creatinine of 1.25 and calculated creatinine clearance of 55.95 mL/min.  He denies any significant pain so will defer additional medication.  Recommend that he keep area clean in order to avoid secondary infection.  We discussed that he is contagious to individuals who have not been exposed to chickenpox or are immunosuppressive despite individuals and so should limit contact with others until lesions have resolved.  Recommend follow-up with primary care if symptoms do not improving quickly.  If anything worsens or changes he is to return for reevaluation.  All questions answered to patient satisfaction.  Final Clinical Impressions(s) / UC Diagnoses   Final diagnoses:  Herpes zoster without complication     Discharge Instructions      We are treating you for shingles.  Start Valtrex 3 times daily.  Keep the area clean with soap and water to avoid infection.  As we discussed, individuals who have not had chickenpox or who are immunosuppressed can catch this from use to try to limit contact with others until the lesions heal.  If anything worsens and you have additional rash, fever, nausea, vomiting, pain you should return for reevaluation.  Follow-up with your primary care next week.     ED Prescriptions     Medication Sig Dispense Auth. Provider   valACYclovir (VALTREX) 1000 MG tablet Take 1 tablet (1,000 mg total) by mouth 3 (three) times daily for 7 days. 21 tablet Tazaria Dlugosz, Noberto Retort, PA-C      PDMP not reviewed this encounter.   Jeani Hawking, PA-C 12/27/22 [id number]

## 2022-12-27 NOTE — ED Triage Notes (Signed)
Patient to Urgent Care with complaints of rash present to left shoulder/ chest.   Symptoms started 4-5 days ago.  Denies any itching/ pain. Has been using hydrocortisone. Denies any new product usage.

## 2022-12-27 NOTE — Discharge Instructions (Signed)
We are treating you for shingles.  Start Valtrex 3 times daily.  Keep the area clean with soap and water to avoid infection.  As we discussed, individuals who have not had chickenpox or who are immunosuppressed can catch this from use to try to limit contact with others until the lesions heal.  If anything worsens and you have additional rash, fever, nausea, vomiting, pain you should return for reevaluation.  Follow-up with your primary care next week.

## 2022-12-29 NOTE — Telephone Encounter (Signed)
Requested Prescriptions  Pending Prescriptions Disp Refills   tamsulosin (FLOMAX) 0.4 MG CAPS capsule [Pharmacy Med Name: Tamsulosin HCl Oral Capsule 0.4 MG] 90 capsule 0    Sig: TAKE 1 CAPSULE EVERY DAY     Urology: Alpha-Adrenergic Blocker Failed - 12/26/2022  7:32 PM      Failed - Last BP in normal range    BP Readings from Last 1 Encounters:  12/27/22 123/87         Failed - Valid encounter within last 12 months    Recent Outpatient Visits           1 year ago Routine general medical examination at a health care facility   [facility] [place], [place] P, DO   2 years ago Wellness examination   [place] [facility], [place] P, DO   3 years ago Routine general medical examination at a health care facility   [facility], [place] P, DO   5 years ago Benign prostatic hyperplasia with urinary frequency   [place] [facility] Steele Sizer, MD   6 years ago Benign prostatic hyperplasia with urinary frequency   [place] [facility] Steele Sizer, MD       Future Appointments             In 2 weeks Laural Benes, Oralia Rud, DO [place] [facility]   In 2 months Gollan, Tollie Pizza, MD [facility] [place]   In 5 months Carman Ching, PA-C [facility] [place]            Passed - PSA in normal range and within 360 days    Prostate Specific Ag, Serum  Date Value Ref Range Status  12/31/2021 1.3 0.0 - 4.0 ng/mL Final    Comment:    Roche ECLIA methodology. According to the American Urological Association, Serum PSA should decrease and remain at undetectable levels after radical prostatectomy. The AUA defines biochemical recurrence as an initial PSA value 0.2 ng/mL or greater followed by a subsequent confirmatory PSA value 0.2 ng/mL or greater. Values obtained with different assay methods or kits cannot be  used interchangeably. Results cannot be interpreted as absolute evidence of the presence or absence of malignant disease.           finasteride (PROSCAR) 5 MG tablet [Pharmacy Med Name: Finasteride Oral Tablet 5 MG] 90 tablet 0    Sig: TAKE 1 TABLET AT BEDTIME     Urology: 5-alpha Reductase Inhibitors Failed - 12/26/2022  7:32 PM      Failed - Valid encounter within last 12 months    Recent Outpatient Visits           1 year ago Routine general medical examination at a health care facility   [facility], [place], DO   2 years ago Wellness examination   [place] [facility], [place] P, DO   3 years ago Routine general medical examination at a health care facility   [facility], [place] P, DO   5 years ago Benign prostatic hyperplasia with urinary frequency   [place] [facility] Steele Sizer, MD   6 years ago Benign prostatic hyperplasia with urinary frequency   [place] [facility] Steele Sizer, MD       Future Appointments  In 2 weeks Laural Benes, Oralia Rud, DO [place] [facility]   In 2 months Gollan, Tollie Pizza, MD [facility] [place]   In 5 months Carman Ching, PA-C [facility] [place]            Passed - PSA in normal range and within 360 days    Prostate Specific Ag, Serum  Date Value Ref Range Status  12/31/2021 1.3 0.0 - 4.0 ng/mL Final    Comment:    Roche ECLIA methodology. According to the American Urological Association, Serum PSA should decrease and remain at undetectable levels after radical prostatectomy. The AUA defines biochemical recurrence as an initial PSA value 0.2 ng/mL or greater followed by a subsequent confirmatory PSA value 0.2 ng/mL or greater. Values obtained with different assay methods or kits cannot be used interchangeably. Results cannot be  interpreted as absolute evidence of the presence or absence of malignant disease.

## 2023-01-13 ENCOUNTER — Ambulatory Visit (INDEPENDENT_AMBULATORY_CARE_PROVIDER_SITE_OTHER): Payer: Medicare HMO | Admitting: Family Medicine

## 2023-01-13 ENCOUNTER — Encounter: Payer: Self-pay | Admitting: Family Medicine

## 2023-01-13 VITALS — BP 138/82 | HR 59 | Ht 69.0 in | Wt 182.4 lb

## 2023-01-13 DIAGNOSIS — I5022 Chronic systolic (congestive) heart failure: Secondary | ICD-10-CM

## 2023-01-13 DIAGNOSIS — K2100 Gastro-esophageal reflux disease with esophagitis, without bleeding: Secondary | ICD-10-CM | POA: Diagnosis not present

## 2023-01-13 DIAGNOSIS — N401 Enlarged prostate with lower urinary tract symptoms: Secondary | ICD-10-CM

## 2023-01-13 DIAGNOSIS — Z0000 Encounter for general adult medical examination without abnormal findings: Secondary | ICD-10-CM | POA: Diagnosis not present

## 2023-01-13 DIAGNOSIS — R350 Frequency of micturition: Secondary | ICD-10-CM | POA: Diagnosis not present

## 2023-01-13 LAB — MICROALBUMIN, URINE WAIVED
Creatinine, Urine Waived: 50 mg/dL (ref 10–300)
Microalb, Ur Waived: 10 mg/L (ref 0–19)
Microalb/Creat Ratio: <30 mg/g (ref <30)

## 2023-01-13 MED ORDER — FINASTERIDE 5 MG PO TABS: 5.0000 mg | ORAL_TABLET | Freq: Every day | ORAL | 3 refills | Status: DC

## 2023-01-13 MED ORDER — OMEPRAZOLE 20 MG PO CPDR: 20.0000 mg | DELAYED_RELEASE_CAPSULE | ORAL | 3 refills | Status: DC

## 2023-01-13 MED ORDER — TAMSULOSIN HCL 0.4 MG PO CAPS: 0.4000 mg | ORAL_CAPSULE | Freq: Every day | ORAL | 3 refills | Status: DC

## 2023-01-13 NOTE — Progress Notes (Signed)
BP 138/82   Pulse (!) 59   Ht 5\' 9"  (1.753 m)   Wt 182 lb 6.4 oz (82.7 kg)   SpO2 97%   BMI 26.94 kg/m    Subjective:    Patient ID: Leroy Merritt, male    DOB: [birth year], 77 y.o.   MRN: [id number]  HPI: Leroy Merritt is a 77 y.o. male presenting on 01/13/2023 for comprehensive medical examination. Current medical complaints include:  BPH BPH status: controlled Satisfied with current treatment?: yes Medication side effects: no Medication compliance: excellent compliance Duration: chronic Nocturia: 1/night Urinary frequency:no Incomplete voiding: no Urgency: no Weak urinary stream: no Straining to start stream: no Dysuria: no Onset: gradual Severity: mild  Interim Problems from his last visit: no  Functional Status Survey: Is the patient deaf or have difficulty hearing?: No Does the patient have difficulty seeing, even when wearing glasses/contacts?: No Does the patient have difficulty concentrating, remembering, or making decisions?: No Does the patient have difficulty walking or climbing stairs?: No Does the patient have difficulty dressing or bathing?: No Does the patient have difficulty doing errands alone such as visiting a doctor's office or shopping?: No  FALL RISK:    01/13/2023   10:18 AM 12/23/2021   10:24 AM 12/19/2021    9:34 AM 12/18/2020    1:19 PM 08/22/2019   10:16 AM  Fall Risk   Falls in the past year? 0 0 0 0 0  Number falls in past yr: 0 0 0  0  Injury with Fall? 0 0 0  0  Risk for fall due to :  No Fall Risks     Follow up Falls evaluation completed Falls evaluation completed Falls evaluation completed;Education provided;Falls prevention discussed      Depression Screen    01/13/2023   10:18 AM 12/23/2021   10:24 AM 12/19/2021    9:41 AM 12/18/2020    1:19 PM 08/22/2019   10:16 AM  Depression screen PHQ 2/9  Decreased Interest 0 0 0 0 0  Down, Depressed, Hopeless 0 0 0 0 0  PHQ - 2 Score 0 0 0 0 0  Altered sleeping  0 0 0    Tired, decreased energy  0 1 0   Change in appetite  0 0 0   Feeling bad or failure about yourself   0 0    Trouble concentrating  0 0 0   Moving slowly or fidgety/restless  0 0 0   Suicidal thoughts  0 0 0   PHQ-9 Score  0 1 0   Difficult doing work/chores  Not difficult at all Not difficult at all      Advanced Directives Does patient have a HCPOA?    no If yes, name and contact information:  Does patient have a living will or MOST form?  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 b.) TTE 03/13/2020: Ao root 39mm; Asc. 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   Cataract    Dilated cardiomyopathy (HCC)    a.) LHC 04/28/2017: EF 35-35%. b.) TTE 04/29/2017: 30-35%. c.) TTE 03/22/2019: 50-55%. d.) TTE  03/13/2020: EF45-50%.   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 b.) Cardiac MRI 04/29/2017: biventricular/biatrial enlargement; LVEF 27%; global HK. c.) TTE 03/13/2020: EF 45-50%; global HK; mild LVH; GLS -12.7%; AoV sclerosis without stenosis; mild MR, triv TR; G1DD.   Hypercholesterolemia    ICD (implantable cardioverter-defibrillator) in place    a.) St. Judes device   NICM (nonischemic cardiomyopathy) (HCC)    a.) TTE 04/29/2017: EF 30-35%. b.) Cardiac MRI 04/29/2017: EF 27%.   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 Jude dual-chamber ICD placement    Surgical History:  Past Surgical History:  Procedure Laterality Date   CARDIAC CATHETERIZATION  04/28/2017   CATARACT EXTRACTION W/ INTRAOCULAR LENS IMPLANT Right 2017   CATARACT EXTRACTION W/PHACO Left 05/20/2019   Procedure: CATARACT EXTRACTION PHACO AND INTRAOCULAR LENS PLACEMENT (IOC) LEFT;  Surgeon: Galen Manila, MD;  Location: [facility];  Service: Ophthalmology;  Laterality: Left;  [place] 01:19.7 CDE 14.58 Fluid Pack Lot # [id number]   COLONOSCOPY     ESOPHAGOGASTRODUODENOSCOPY (EGD) WITH PROPOFOL N/A 02/19/2015   Procedure: ESOPHAGOGASTRODUODENOSCOPY (EGD) WITH PROPOFOL and esophageal dilation.;  Surgeon: Midge Minium, MD;  Location: [facility];  Service: Endoscopy;  Laterality: N/A;   EYE SURGERY     ICD IMPLANT N/A 04/29/2017   Procedure: ICD IMPLANT;  Surgeon: Marinus Maw, MD;  Location: [facility];  Service: Cardiovascular;  Laterality: N/A;   KNEE ARTHROPLASTY Left 08/12/2021   Procedure: COMPUTER ASSISTED TOTAL KNEE ARTHROPLASTY;  Surgeon: Donato Heinz, MD;  Location: [facility];  Service: Orthopedics;  Laterality: Left;   LEFT HEART CATH AND CORONARY ANGIOGRAPHY N/A 04/28/2017   Procedure: LEFT HEART CATH AND CORONARY ANGIOGRAPHY;  Surgeon: [place], Peter M, MD;  Location: [facility] INVASIVE CV LAB;  Service: Cardiovascular;  Laterality: N/A;   PROSTATE BIOPSY  ~ 2012   RETINAL DETACHMENT SURGERY Right 1990s   "torn retina"   TOTAL HIP ARTHROPLASTY Right 03/20/2021   Procedure: TOTAL HIP ARTHROPLASTY;  Surgeon: Donato Heinz, MD;  Location: [facility];  Service: Orthopedics;  Laterality: Right;    Medications:  Current Outpatient Medications on File Prior to Visit  Medication Sig   clobetasol ointment (TEMOVATE) 0.05  % Apply 1 Application topically 2 (two) times daily.   eplerenone (INSPRA) 25 MG tablet TAKE 1 TABLET BY MOUTH DAILY   sacubitril-valsartan (ENTRESTO) 49-51 MG TAKE 1 TABLET TWICE DAILY   sotalol (BETAPACE) 80 MG tablet Take 1 tablet (80 mg total) by mouth 2 (two) times daily. Keep scheduled appointment for further refills.   tadalafil (CIALIS) 20 MG tablet Take one tablet at least 60 minutes prior to sexual activity.   No current facility-administered medications on file prior to visit.    Allergies:  No Known Allergies  Social History:  Social History   Socioeconomic History   Marital status: Married    Spouse name: [name]   Number of children: Not on file   Years of education: college   Highest education level:  Bachelor's degree (e.g., BA, AB, BS)  Occupational History   Not on file  Tobacco Use   Smoking status: Never    Passive exposure: Never   Smokeless tobacco: Never  Vaping Use   Vaping status: Never Used  Substance and Sexual Activity   Alcohol use: Yes    Alcohol/week: 1.0 standard drink of alcohol    Types: 1 Glasses of wine per week    Comment: 1 Drink/Week   Drug use: No   Sexual activity: Yes    Birth control/protection: None    Comment: NA  Other Topics Concern   Not on file  Social History Narrative   Not on file   Social Determinants of Health   Financial Resource Strain: Low Risk  (01/09/2023)   Overall Financial Resource Strain (CARDIA)    Difficulty of Paying Living Expenses: Not hard at all  Food Insecurity: No Food Insecurity (01/09/2023)   Hunger Vital Sign    Worried About Running Out of Food in the Last Year: Never true    Ran Out of Food in the Last Year: Never true  Transportation Needs: No Transportation Needs (01/09/2023)   PRAPARE - Administrator, Civil Service (Medical): No    Lack of Transportation (Non-Medical): No  Physical Activity: Insufficiently Active (01/09/2023)   Exercise Vital Sign    Days of Exercise per Week:  2 days    Minutes of Exercise per Session: 30 min  Stress: No Stress Concern Present (01/09/2023)   [employer] of Occupational Health - Occupational Stress Questionnaire    Feeling of Stress : Not at all  Social Connections: Socially Integrated (01/09/2023)   Social Connection and Isolation Panel [NHANES]    Frequency of Communication with Friends and Family: More than three times a week    Frequency of Social Gatherings with Friends and Family: More than three times a week    Attends Religious Services: More than 4 times per year    Active Member of [employer] or Organizations: Yes    Attends Engineer, structural: More than 4 times per year    Marital Status: Married  Catering manager Violence: Not At Risk (12/19/2021)   Humiliation, Afraid, Rape, and Kick questionnaire    Fear of Current or Ex-Partner: No    Emotionally Abused: No    Physically Abused: No    Sexually Abused: No   Social History   Tobacco Use  Smoking Status Never   Passive exposure: Never  Smokeless Tobacco Never   Social History   Substance and Sexual Activity  Alcohol Use Yes   Alcohol/week: 1.0 standard drink of alcohol   Types: 1 Glasses of wine per week   Comment: 1 Drink/Week    Family History:  Family History  Problem Relation Age of Onset   Arthritis Mother    Diabetes Father    Hypertension Father    Heart disease Brother    Hyperlipidemia Brother    Hypertension Brother    Lung cancer Daughter    Cancer Daughter     Past medical history, surgical history, medications, allergies, family history and social history reviewed with patient today and changes made to appropriate areas of the chart.   Review of Systems  Constitutional: Negative.   HENT:  Positive for hearing loss. Negative for congestion, ear discharge, ear pain, nosebleeds, sinus pain, sore throat and tinnitus.   Eyes: Negative.   Respiratory: Negative.  Negative for stridor.   Cardiovascular: Negative.  Gastrointestinal: Negative.   Genitourinary: Negative.   Musculoskeletal: Negative.   Skin: Negative.   Neurological: Negative.   Endo/Heme/Allergies: Negative.   Psychiatric/Behavioral: Negative.     All other ROS negative except what is listed above and in the HPI.      Objective:    BP 138/82   Pulse (!) 59   Ht 5\' 9"  (1.753 m)   Wt 182 lb 6.4 oz (82.7 kg)   SpO2 97%   BMI 26.94 kg/m   Wt Readings from Last 3 Encounters:  01/13/23 182 lb 6.4 oz (82.7 kg)  09/08/22 182 lb (82.6 kg)  06/17/22 183 lb (83 kg)    Physical Exam Vitals and nursing note reviewed.  Constitutional:      General: He is not in acute distress.    Appearance: Normal appearance. He is not ill-appearing, toxic-appearing or diaphoretic.  HENT:     Head: Normocephalic and atraumatic.     Right Ear: Tympanic membrane, ear canal and external ear normal. There is no impacted cerumen.     Left Ear: Tympanic membrane, ear canal and external ear normal. There is no impacted cerumen.     Nose: Nose normal. No congestion or rhinorrhea.     Mouth/Throat:     Mouth: Mucous membranes are moist.     Pharynx: Oropharynx is clear. No oropharyngeal exudate or posterior oropharyngeal erythema.  Eyes:     General: No scleral icterus.       Right eye: No discharge.        Left eye: No discharge.     Extraocular Movements: Extraocular movements intact.     Conjunctiva/sclera: Conjunctivae normal.     Pupils: Pupils are equal, round, and reactive to light.  Neck:     Vascular: No carotid bruit.  Cardiovascular:     Rate and Rhythm: Normal rate and regular rhythm.     Pulses: Normal pulses.     Heart sounds: No murmur heard.    No friction rub. No gallop.  Pulmonary:     Effort: Pulmonary effort is normal. No respiratory distress.     Breath sounds: Normal breath sounds. No stridor. No wheezing, rhonchi or rales.  Chest:     Chest wall: No tenderness.  Abdominal:     General: Abdomen is flat. Bowel sounds are  normal. There is no distension.     Palpations: Abdomen is soft. There is no mass.     Tenderness: There is no abdominal tenderness. There is no right CVA tenderness, left CVA tenderness, guarding or rebound.     Hernia: No hernia is present.  Genitourinary:    Comments: Genital exam deferred with shared decision making Musculoskeletal:        General: No swelling, tenderness, deformity or signs of injury.     Cervical back: Normal range of motion and neck supple. No rigidity. No muscular tenderness.     Right lower leg: No edema.     Left lower leg: No edema.  Lymphadenopathy:     Cervical: No cervical adenopathy.  Skin:    General: Skin is warm and dry.     Capillary Refill: Capillary refill takes less than 2 seconds.     Coloration: Skin is not jaundiced or pale.     Findings: No bruising, erythema, lesion or rash.  Neurological:     General: No focal deficit present.     Mental Status: He is alert and oriented to person, place, and time.  Cranial Nerves: No cranial nerve deficit.     Sensory: No sensory deficit.     Motor: No weakness.     Coordination: Coordination normal.     Gait: Gait normal.     Deep Tendon Reflexes: Reflexes normal.  Psychiatric:        Mood and Affect: Mood normal.        Behavior: Behavior normal.        Thought Content: Thought content normal.        Judgment: Judgment normal.        01/13/2023   10:10 AM 12/19/2021    9:35 AM 12/18/2020    1:38 PM 03/16/2017    9:11 AM  6CIT Screen  What Year? 0 points 0 points 0 points 0 points  What month? 0 points 0 points 0 points 0 points  What time? 0 points 0 points 0 points 0 points  Count back from 20 0 points 0 points 0 points 0 points  Months in reverse 0 points 0 points 0 points 0 points  Repeat phrase 4 points 0 points 2 points 0 points  Total Score 4 points 0 points 2 points 0 points    Results for orders placed or performed in visit on 11/21/22  CUP PACEART REMOTE DEVICE CHECK  Result  Value Ref Range   Date Time Interrogation Session [id number]    Pulse Generator Manufacturer [employer]    Pulse Gen Model 2411-36C Ellipse DR    Pulse Gen Serial Number [id number]    Clinic Name [facility]    Implantable Pulse Generator Type Implantable Cardiac Defibulator    Implantable Pulse Generator Implant Date 76283151    Implantable Lead Manufacturer [employer]    Implantable Lead Model 7122 Durata    Implantable Lead Serial Number [id number]    Implantable Lead Implant Date 76160737    Implantable Lead Location Detail 1 APEX    Implantable Lead Location [id number]    Implantable Lead Connection Status [id number]    Implantable Lead Manufacturer [facility]    Implantable Lead Model LPA1200M Tendril MRI    Implantable Lead Serial Number [id number]    Implantable Lead Implant Date 10626948    Implantable Lead Location Detail 1 UNKNOWN    Implantable Lead Location [id number]    Implantable Lead Connection Status [id number]    Lead Channel Setting Sensing Sensitivity 0.5 mV   Lead Channel Setting Sensing Adaptation Mode Adaptive Sensing    Lead Channel Setting Pacing Amplitude 2.0 V   Lead Channel Setting Pacing Pulse Width 0.5 ms   Lead Channel Setting Pacing Amplitude 2.5 V   Zone Setting Status Active    Zone Setting Status Active    Zone Setting Status Active    Lead Channel Status NULL    Lead Channel Impedance Value 380 ohm   Lead Channel Sensing Intrinsic Amplitude 1.4 mV   Lead Channel Pacing Threshold Amplitude 1.0 V   Lead Channel Pacing Threshold Pulse Width 0.5 ms   Lead Channel Status NULL    Lead Channel Impedance Value 400 ohm   Lead Channel Sensing Intrinsic Amplitude 11.8 mV   Lead Channel Pacing Threshold Amplitude 0.75 V   Lead Channel Pacing Threshold Pulse Width 0.5 ms   HighPow Impedance 74 ohm   HighPow Impedance 74 ohm   HighPow Imped Status NULL    HighPow Imped Status NULL    Battery Status MOS    Battery Remaining Longevity 44 mo   Battery Remaining Percentage 45.0 %  Battery Voltage 2.93 V   Brady Statistic RA Percent Paced 3.0 %   Brady Statistic RV Percent Paced 3.0 %   Brady Statistic AP VP Percent 1.0 %   Brady Statistic AS VP Percent 2.5 %   Brady Statistic AP VS Percent 9.1 %   Brady Statistic AS VS Percent 84.0 %      Assessment & Plan:   Problem List Items Addressed This Visit       Cardiovascular and Mediastinum   Chronic systolic heart failure (HCC)    Continue to follow with cardiology. Call with any concerns. Continue to monitor. Labs drawn today.       Relevant Orders   Comprehensive metabolic panel   CBC with Differential/Platelet   Lipid Panel w/o Chol/HDL Ratio   TSH   Microalbumin, Urine Waived     Digestive   Reflux esophagitis    Under good control on current regimen. Continue current regimen. Continue to monitor. Call with any concerns. Refills given. Labs drawn today.       Relevant Medications   omeprazole (PRILOSEC) 20 MG capsule     Genitourinary   BPH (benign prostatic hyperplasia)    Under good control on current regimen. Continue current regimen. Continue to monitor. Call with any concerns. Refills given. Labs drawn today.        Relevant Medications   finasteride (PROSCAR) 5 MG tablet   tamsulosin (FLOMAX) 0.4 MG CAPS capsule   Other Relevant Orders   PSA   Other Visit Diagnoses     Encounter for Medicare annual wellness exam    -  Primary   Preventative care discussed today as below.   Routine general medical examination at a health care facility       Vaccines up to date/declined. Screening labs checked today. Continue diet and exercise. Call with any concerns.        Preventative Services:  Health Risk Assessment and Personalized Prevention Plan: Done today Bone Mass Measurements: N/A CVD Screening: Done today  Colon Cancer Screening: N/A Depression Screening: Done today Diabetes Screening: Done today Glaucoma Screening: See your eye doctor Hepatitis B vaccine: N/A Hepatitis C  screening: up to date HIV Screening: up to date Flu Vaccine: Declined Lung cancer Screening: N/A Obesity Screening: Done today Pneumonia Vaccines (2): up to date STI Screening: N/A PSA screening: Done today  LABORATORY TESTING:  Health maintenance labs ordered today as discussed above.   The natural history of prostate cancer and ongoing controversy regarding screening and potential treatment outcomes of prostate cancer has been discussed with the patient. The meaning of a false positive PSA and a false negative PSA has been discussed. He indicates understanding of the limitations of this screening test and wishes  to proceed with screening PSA testing.   IMMUNIZATIONS:   - Tdap: Tetanus vaccination status reviewed: Declined. - Influenza: Refused - Pneumovax: Up to date - Prevnar: Up to date - Zostavax vaccine: Refused  SCREENING: - Colonoscopy: Not applicable  Discussed with patient purpose of the colonoscopy is to detect colon cancer at curable precancerous or early stages   PATIENT COUNSELING:    Sexuality: Discussed sexually transmitted diseases, partner selection, use of condoms, avoidance of unintended pregnancy  and contraceptive alternatives.   Advised to avoid cigarette smoking.  I discussed with the patient that most people either abstain from alcohol or drink within safe limits (<=14/week and <=4 drinks/occasion for males, <=7/weeks and <= 3 drinks/occasion for females) and that the risk for alcohol disorders  and other health effects rises proportionally with the number of drinks per week and how often a drinker exceeds daily limits.  Discussed cessation/primary prevention of drug use and availability of treatment for abuse.   Diet: Encouraged to adjust caloric intake to maintain  or achieve ideal body weight, to reduce intake of dietary saturated fat and total fat, to limit sodium intake by avoiding high sodium foods and not adding table salt, and to maintain adequate  dietary potassium and calcium preferably from fresh fruits, vegetables, and low-fat dairy products.    stressed the importance of regular exercise  Injury prevention: Discussed safety belts, safety helmets, smoke detector, smoking near bedding or upholstery.   Dental health: Discussed importance of regular tooth brushing, flossing, and dental visits.   Follow up plan: NEXT PREVENTATIVE PHYSICAL DUE IN 1 YEAR. Return in about 1 year (around 01/13/2024) for physical.

## 2023-01-13 NOTE — Assessment & Plan Note (Signed)
Under good control on current regimen. Continue current regimen. Continue to monitor. Call with any concerns. Refills given. Labs drawn today.   

## 2023-01-13 NOTE — Patient Instructions (Signed)
Preventative Services:  Health Risk Assessment and Personalized Prevention Plan: Done today Bone Mass Measurements: N/A CVD Screening: Done today  Colon Cancer Screening: N/A Depression Screening: Done today Diabetes Screening: Done today Glaucoma Screening: See your eye doctor Hepatitis B vaccine: N/A Hepatitis C screening: up to date HIV Screening: up to date Flu Vaccine: Declined Lung cancer Screening: N/A Obesity Screening: Done today Pneumonia Vaccines (2): up to date STI Screening: N/A PSA screening: Done today

## 2023-01-13 NOTE — Assessment & Plan Note (Signed)
Continue to follow with cardiology. Call with any concerns. Continue to monitor. Labs drawn today.

## 2023-01-14 LAB — CBC WITH DIFFERENTIAL/PLATELET
Basophils Absolute: 0 10*3/uL (ref 0.0–0.2)
Basos: 1 %
EOS (ABSOLUTE): 0.1 10*3/uL (ref 0.0–0.4)
Eos: 2 %
Hematocrit: 45.9 % (ref 37.5–51.0)
Hemoglobin: 15.3 g/dL (ref 13.0–17.7)
Immature Grans (Abs): 0 10*3/uL (ref 0.0–0.1)
Immature Granulocytes: 1 %
Lymphocytes Absolute: 1.5 10*3/uL (ref 0.7–3.1)
Lymphs: 23 %
MCH: 30.1 pg (ref 26.6–33.0)
MCHC: 33.3 g/dL (ref 31.5–35.7)
MCV: 90 fL (ref 79–97)
Monocytes Absolute: 0.7 10*3/uL (ref 0.1–0.9)
Monocytes: 12 %
Neutrophils Absolute: 3.9 10*3/uL (ref 1.4–7.0)
Neutrophils: 61 %
Platelets: 156 10*3/uL (ref 150–450)
RBC: 5.09 x10E6/uL (ref 4.14–5.80)
RDW: 13.8 % (ref 11.6–15.4)
WBC: 6.3 10*3/uL (ref 3.4–10.8)

## 2023-01-14 LAB — COMPREHENSIVE METABOLIC PANEL
ALT: 12 [IU]/L (ref 0–44)
AST: 13 [IU]/L (ref 0–40)
Albumin: 4.2 g/dL (ref 3.8–4.8)
Alkaline Phosphatase: 69 [IU]/L (ref 44–121)
BUN/Creatinine Ratio: 13 (ref 10–24)
BUN: 14 mg/dL (ref 8–27)
Bilirubin Total: 0.6 mg/dL (ref 0.0–1.2)
CO2: 19 mmol/L — ABNORMAL LOW (ref 20–29)
Calcium: 9 mg/dL (ref 8.6–10.2)
Chloride: 106 mmol/L (ref 96–106)
Creatinine, Ser: 1.09 mg/dL (ref 0.76–1.27)
Globulin, Total: 2.2 g/dL (ref 1.5–4.5)
Glucose: 97 mg/dL (ref 70–99)
Potassium: 4.3 mmol/L (ref 3.5–5.2)
Sodium: 140 mmol/L (ref 134–144)
Total Protein: 6.4 g/dL (ref 6.0–8.5)
eGFR: 69 mL/min/{1.73_m2} (ref >59)

## 2023-01-14 LAB — LIPID PANEL W/O CHOL/HDL RATIO
Cholesterol, Total: 156 mg/dL (ref 100–199)
HDL: 49 mg/dL (ref >39)
LDL Chol Calc (NIH): 92 mg/dL (ref 0–99)
Triglycerides: 78 mg/dL (ref 0–149)
VLDL Cholesterol Cal: 15 mg/dL (ref 5–40)

## 2023-01-14 LAB — TSH: TSH: 1.69 u[IU]/mL (ref 0.450–4.500)

## 2023-01-14 LAB — PSA: Prostate Specific Ag, Serum: 1.2 ng/mL (ref 0.0–4.0)

## 2023-02-20 ENCOUNTER — Ambulatory Visit: Payer: Medicare HMO

## 2023-02-20 DIAGNOSIS — I428 Other cardiomyopathies: Secondary | ICD-10-CM

## 2023-02-20 LAB — CUP PACEART REMOTE DEVICE CHECK
Battery Remaining Longevity: 42 mo
Battery Remaining Percentage: 44 %
Battery Voltage: 2.93 V
Brady Statistic AP VP Percent: 1 %
Brady Statistic AP VS Percent: 7.7 %
Brady Statistic AS VP Percent: 2.9 %
Brady Statistic AS VS Percent: 85 %
Brady Statistic RA Percent Paced: 2.4 %
Brady Statistic RV Percent Paced: 3.3 %
Date Time Interrogation Session: 20241213040017
HighPow Impedance: 74 Ohm
HighPow Impedance: 74 Ohm
Implantable Lead Connection Status: 753985
Implantable Lead Connection Status: 753985
Implantable Lead Implant Date: 20190220
Implantable Lead Implant Date: 20190220
Implantable Lead Location: 753859
Implantable Lead Location: 753860
Implantable Lead Model: 7122
Implantable Pulse Generator Implant Date: 20190220
Lead Channel Impedance Value: 380 Ohm
Lead Channel Impedance Value: 400 Ohm
Lead Channel Pacing Threshold Amplitude: 0.75 V
Lead Channel Pacing Threshold Amplitude: 1 V
Lead Channel Pacing Threshold Pulse Width: 0.5 ms
Lead Channel Pacing Threshold Pulse Width: 0.5 ms
Lead Channel Sensing Intrinsic Amplitude: 1.2 mV
Lead Channel Sensing Intrinsic Amplitude: 11.8 mV
Lead Channel Setting Pacing Amplitude: 2 V
Lead Channel Setting Pacing Amplitude: 2.5 V
Lead Channel Setting Pacing Pulse Width: 0.5 ms
Lead Channel Setting Sensing Sensitivity: 0.5 mV
Pulse Gen Serial Number: 9787528

## 2023-03-01 NOTE — Progress Notes (Unsigned)
Cardiology Office Note  Date:  03/02/2023   ID:  Leroy Merritt, Leroy Merritt [birth year], MRN [id number]  PCP:  Dorcas Carrow, DO   Chief Complaint  Patient presents with   6 month follow up     Patient c/o shortness of breath with over exertion. Medications reviewed by the patient verbally.     HPI:  [name] is a 77 yo male with PMH Nonischemic cardiomyopathy nonobstructive CAD by LHC in 04/2017  2/19 Echo  30-35 %  HFrEF  symptomatic VT status post SJM dual-chamber ICD in 04/2017 1/22 Echo 40-45%  On sotalol for PVC for suppression 7/24 Echo 40-45%  Who presents for follow-up of his cardiomyopathy, atrial arrhythmia, PVCs  LOV with myself October 2023 Seen by EP July 2024  July 2024, results reviewed Ejection fraction 45 to 50% normal RV size and function No significant valvular heart disease  Discussed medication management On sotalol 80 twice daily for PVC suppression Tolerating Entresto twice daily on eplerenone 25  Active active, denies chest pain or shortness of breath, no swelling, no PND orthopnea  Lab work reviewed Total chol 174,LDL 108 Normal CMP  Pacer download reviewed No significant atrial fibrillation Minimal paroxysmal atrial tachycardia No significant VT  Did not tolerate spironolactone in the past secondary to breast tenderness Did not tolerate Farxiga secondary to scrotal and groin rash, possible yeast infection, resolved with treatment  EKG personally reviewed by myself on todays visit EKG Interpretation Date/Time:  Monday March 02 2023 14:02:43 EST Ventricular Rate:  64 PR Interval:  264 QRS Duration:  84 QT Interval:  418 QTC Calculation: 431 R Axis:   -50  Text Interpretation: Sinus rhythm with 1st degree A-V block Left axis deviation Inferior infarct (cited on or before 02-Mar-2023) When compared with ECG of 08-Sep-2022 13:01, Sinus rhythm has replaced Electronic atrial pacemaker Confirmed by Julien Nordmann [phone]) on  03/02/2023 2:12:38 PM   Other past medical history reviewed Discussed prior events 2019  had long runs of sustained VT, underlying sinus rhythm Clear A-V dissociation  transferred from our office to [name] which showed no obstructive CAD,  LVEF was markedly reduced with LV gram estimate of EF of 25 to 35%.    Echo on 04/29/2017 showed an EF of 30 to 35%,   Cardiac MRI on 04/29/2017 showed severe LVE with diffuse hypokinesis with an EF of 27%,   [employer] dual-chamber ICD implantation on 04/29/2017.  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), BPH (benign prostatic hyperplasia), CAD (coronary artery disease), Cataract, Dilated cardiomyopathy (HCC), Diverticulosis (2017), Dysphagia (12/20/2014), Esophagitis, First degree AV block, GERD (gastroesophageal reflux disease), HFrEF (heart failure with reduced ejection fraction) (HCC) (04/29/2017), Hypercholesterolemia, ICD (implantable cardioverter-defibrillator) in place, NICM (nonischemic cardiomyopathy) (HCC), Palpitations, Stricture and stenosis of esophagus, Urethral stricture (03/2021), 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  PSH:    Past Surgical History:  Procedure Laterality Date   CARDIAC CATHETERIZATION  04/28/2017   CATARACT EXTRACTION W/ INTRAOCULAR LENS IMPLANT Right 2017   CATARACT EXTRACTION W/PHACO Left 05/20/2019   Procedure: CATARACT EXTRACTION PHACO AND INTRAOCULAR LENS PLACEMENT (IOC) LEFT;  Surgeon: Galen Manila, MD;  Location: [facility] ORS;  Service: Ophthalmology;  Laterality: Left;  [place] 01:19.7 CDE 14.58 Fluid Pack Lot # [id number]   COLONOSCOPY     ESOPHAGOGASTRODUODENOSCOPY (EGD) WITH PROPOFOL N/A 02/19/2015   Procedure: ESOPHAGOGASTRODUODENOSCOPY (EGD)  WITH PROPOFOL and esophageal dilation.;  Surgeon: Midge Minium, MD;   Location: [facility];  Service: Endoscopy;  Laterality: N/A;   EYE SURGERY     ICD IMPLANT N/A 04/29/2017   Procedure: ICD IMPLANT;  Surgeon: Marinus Maw, MD;  Location: [facility] INVASIVE CV LAB;  Service: Cardiovascular;  Laterality: N/A;   KNEE ARTHROPLASTY Left 08/12/2021   Procedure: COMPUTER ASSISTED TOTAL KNEE ARTHROPLASTY;  Surgeon: Donato Heinz, MD;  Location: [facility] ORS;  Service: Orthopedics;  Laterality: Left;   LEFT HEART CATH AND CORONARY ANGIOGRAPHY N/A 04/28/2017   Procedure: LEFT HEART CATH AND CORONARY ANGIOGRAPHY;  Surgeon: [place], Peter M, MD;  Location: [facility] INVASIVE CV LAB;  Service: Cardiovascular;  Laterality: N/A;   PROSTATE BIOPSY  ~ 2012   RETINAL DETACHMENT SURGERY Right 1990s   "torn retina"   TOTAL HIP ARTHROPLASTY Right 03/20/2021   Procedure: TOTAL HIP ARTHROPLASTY;  Surgeon: Donato Heinz, MD;  Location: [facility] ORS;  Service: Orthopedics;  Laterality: Right;    Current Outpatient Medications  Medication Sig Dispense Refill   clobetasol ointment (TEMOVATE) 0.05 % Apply 1 Application topically 2 (two) times daily. 30 g 0   eplerenone (INSPRA) 25 MG tablet TAKE 1 TABLET BY MOUTH DAILY 90 tablet 3   finasteride (PROSCAR) 5 MG tablet Take 1 tablet (5 mg total) by mouth at bedtime. 90 tablet 3   omeprazole (PRILOSEC) 20 MG capsule Take 1 capsule (20 mg total) by mouth every morning. 90 capsule 3   sacubitril-valsartan (ENTRESTO) 49-51 MG TAKE 1 TABLET TWICE DAILY 180 tablet 0   sotalol (BETAPACE) 80 MG tablet Take 1 tablet (80 mg total) by mouth 2 (two) times daily. Keep scheduled appointment for further refills. 180 tablet 0   tadalafil (CIALIS) 20 MG tablet Take one tablet at least 60 minutes prior to sexual activity. 30 tablet 2   tamsulosin (FLOMAX) 0.4 MG CAPS capsule Take 1 capsule (0.4 mg total) by mouth daily. 90 capsule 3   No current facility-administered medications for this visit.    Allergies:   Patient has no known allergies.   Social  History:  The patient  reports that he has never smoked. He has never been exposed to tobacco smoke. He has never used smokeless tobacco. He reports current alcohol use of about 1.0 standard drink of alcohol per week. He reports that he does not use drugs.   Family History:   family history includes Arthritis in his mother; Cancer in his daughter; Diabetes in his father; Heart disease in his brother; Hyperlipidemia in his brother; Hypertension in his brother and father; Lung cancer in his daughter.   Review of Systems: Review of Systems  Constitutional: Negative.   HENT: Negative.    Respiratory: Negative.    Cardiovascular: Negative.   Gastrointestinal: Negative.   Musculoskeletal: Negative.   Neurological: Negative.   Psychiatric/Behavioral: Negative.    All other systems reviewed and are negative.  PHYSICAL EXAM: VS:  BP 110/70 (BP Location: Left Arm, Patient Position: Sitting, Cuff Size: Normal)   Pulse 64   Ht 5\' 10"  (1.778 m)   Wt 185 lb 4 oz (84 kg)   SpO2 95%   BMI 26.58 kg/m  , BMI Body mass index is 26.58 kg/m. Constitutional:  oriented to person, place, and time. No distress.  HENT:  Head: Grossly normal Eyes:  no discharge. No scleral icterus.  Neck: No JVD, no carotid bruits  Cardiovascular: Regular rate and rhythm, no murmurs appreciated Pulmonary/Chest: Clear to auscultation  bilaterally, no wheezes or rails Abdominal: Soft.  no distension.  no tenderness.  Musculoskeletal: Normal range of motion Neurological:  normal muscle tone. Coordination normal. No atrophy Skin: Skin warm and dry Psychiatric: normal affect, pleasant  Recent Labs: 09/08/2022: Magnesium 2.3 01/13/2023: ALT 12; BUN 14; Creatinine, Ser 1.09; Hemoglobin 15.3; Platelets 156; Potassium 4.3; Sodium 140; TSH 1.690    Lipid Panel Lab Results  Component Value Date   CHOL 156 01/13/2023   HDL 49 01/13/2023   LDLCALC 92 01/13/2023   TRIG 78 01/13/2023    Wt Readings from Last 3 Encounters:   03/02/23 185 lb 4 oz (84 kg)  01/13/23 182 lb 6.4 oz (82.7 kg)  09/08/22 182 lb (82.6 kg)     ASSESSMENT AND PLAN:  VT (ventricular tachycardia) (HCC) ICD device downloads reviewed on sotalol followed by Dr. Graciela Husbands no significant VT burden  Tachyarrhythmias Denies significant tachypalpitations on sotalol 80 twice daily Atrial tachyarrhythmia documented on ICD download less than 1% burden Managed by EP, not on anticoagulation  Dilated cardiomyopathy Tolerating eplerenone, Entresto, sotalol Off spironolactone secondary to breast tenderness Did not tolerate Farxiga, groin /scrotal rashes Ejection fraction stable 45 to 50% Asymptomatic, appears euvolemic    Orders Placed This Encounter  Procedures   EKG 12-Lead     Signed, Dossie Arbour, M.D., Ph.D. 03/02/2023  [facility] [place], [place] [phone]

## 2023-03-02 ENCOUNTER — Encounter: Payer: Self-pay | Admitting: Cardiovascular Disease

## 2023-03-02 ENCOUNTER — Ambulatory Visit: Payer: Medicare HMO | Attending: Cardiovascular Disease | Admitting: Cardiovascular Disease

## 2023-03-02 VITALS — BP 110/70 | HR 64 | Ht 70.0 in | Wt 185.2 lb

## 2023-03-02 DIAGNOSIS — I951 Orthostatic hypotension: Secondary | ICD-10-CM

## 2023-03-02 DIAGNOSIS — I420 Dilated cardiomyopathy: Secondary | ICD-10-CM

## 2023-03-02 DIAGNOSIS — Z95810 Presence of automatic (implantable) cardiac defibrillator: Secondary | ICD-10-CM

## 2023-03-02 DIAGNOSIS — I5022 Chronic systolic (congestive) heart failure: Secondary | ICD-10-CM

## 2023-03-02 DIAGNOSIS — I5032 Chronic diastolic (congestive) heart failure: Secondary | ICD-10-CM

## 2023-03-02 DIAGNOSIS — R0600 Dyspnea, unspecified: Secondary | ICD-10-CM

## 2023-03-02 DIAGNOSIS — I472 Ventricular tachycardia, unspecified: Secondary | ICD-10-CM

## 2023-03-02 DIAGNOSIS — I428 Other cardiomyopathies: Secondary | ICD-10-CM

## 2023-03-02 DIAGNOSIS — R0602 Shortness of breath: Secondary | ICD-10-CM | POA: Diagnosis not present

## 2023-03-02 DIAGNOSIS — I5020 Unspecified systolic (congestive) heart failure: Secondary | ICD-10-CM

## 2023-03-02 DIAGNOSIS — R002 Palpitations: Secondary | ICD-10-CM

## 2023-03-02 MED ORDER — SOTALOL HCL 80 MG PO TABS: 80.0000 mg | ORAL_TABLET | Freq: Two times a day (BID) | ORAL | 2 refills | Status: DC

## 2023-03-02 MED ORDER — ENTRESTO 49-51 MG PO TABS: 1.0000 | ORAL_TABLET | Freq: Two times a day (BID) | ORAL | 2 refills | Status: DC

## 2023-03-02 NOTE — Patient Instructions (Signed)

## 2023-03-26 NOTE — Progress Notes (Signed)
Remote ICD transmission.   

## 2023-04-23 MED ORDER — CLOBETASOL PROPIONATE 0.05 % EX OINT: 1.0000 | TOPICAL_OINTMENT | Freq: Two times a day (BID) | CUTANEOUS | 0 refills | Status: AC

## 2023-05-22 ENCOUNTER — Ambulatory Visit (INDEPENDENT_AMBULATORY_CARE_PROVIDER_SITE_OTHER): Payer: Medicare HMO

## 2023-05-22 DIAGNOSIS — I428 Other cardiomyopathies: Secondary | ICD-10-CM | POA: Diagnosis not present

## 2023-05-22 LAB — CUP PACEART REMOTE DEVICE CHECK
Battery Remaining Longevity: 40 mo
Battery Remaining Percentage: 41 %
Battery Voltage: 2.93 V
Brady Statistic AP VP Percent: 1 %
Brady Statistic AP VS Percent: 9.4 %
Brady Statistic AS VP Percent: 2.7 %
Brady Statistic AS VS Percent: 83 %
Brady Statistic RA Percent Paced: 2.5 %
Brady Statistic RV Percent Paced: 3 %
Date Time Interrogation Session: 20250314050017
HighPow Impedance: 73 Ohm
HighPow Impedance: 73 Ohm
Implantable Lead Connection Status: 753985
Implantable Lead Connection Status: 753985
Implantable Lead Implant Date: 20190220
Implantable Lead Implant Date: 20190220
Implantable Lead Location: 753859
Implantable Lead Location: 753860
Implantable Lead Model: 7122
Implantable Pulse Generator Implant Date: 20190220
Lead Channel Impedance Value: 390 Ohm
Lead Channel Impedance Value: 410 Ohm
Lead Channel Pacing Threshold Amplitude: 0.75 V
Lead Channel Pacing Threshold Amplitude: 1 V
Lead Channel Pacing Threshold Pulse Width: 0.5 ms
Lead Channel Pacing Threshold Pulse Width: 0.5 ms
Lead Channel Sensing Intrinsic Amplitude: 1.6 mV
Lead Channel Sensing Intrinsic Amplitude: 11.8 mV
Lead Channel Setting Pacing Amplitude: 2 V
Lead Channel Setting Pacing Amplitude: 2.5 V
Lead Channel Setting Pacing Pulse Width: 0.5 ms
Lead Channel Setting Sensing Sensitivity: 0.5 mV
Pulse Gen Serial Number: 9787528

## 2023-05-24 IMAGING — DX DG HIP (WITH OR WITHOUT PELVIS) 1V PORT*R*
3 series · 3 of 3 positions shown · non-contrast
Comparison: None.

CLINICAL DATA: Post total hip arthroplasty (right)

EXAM:
DG HIP (WITH OR WITHOUT PELVIS) 1V PORT RIGHT

[pelvis ap (1 of 2)]
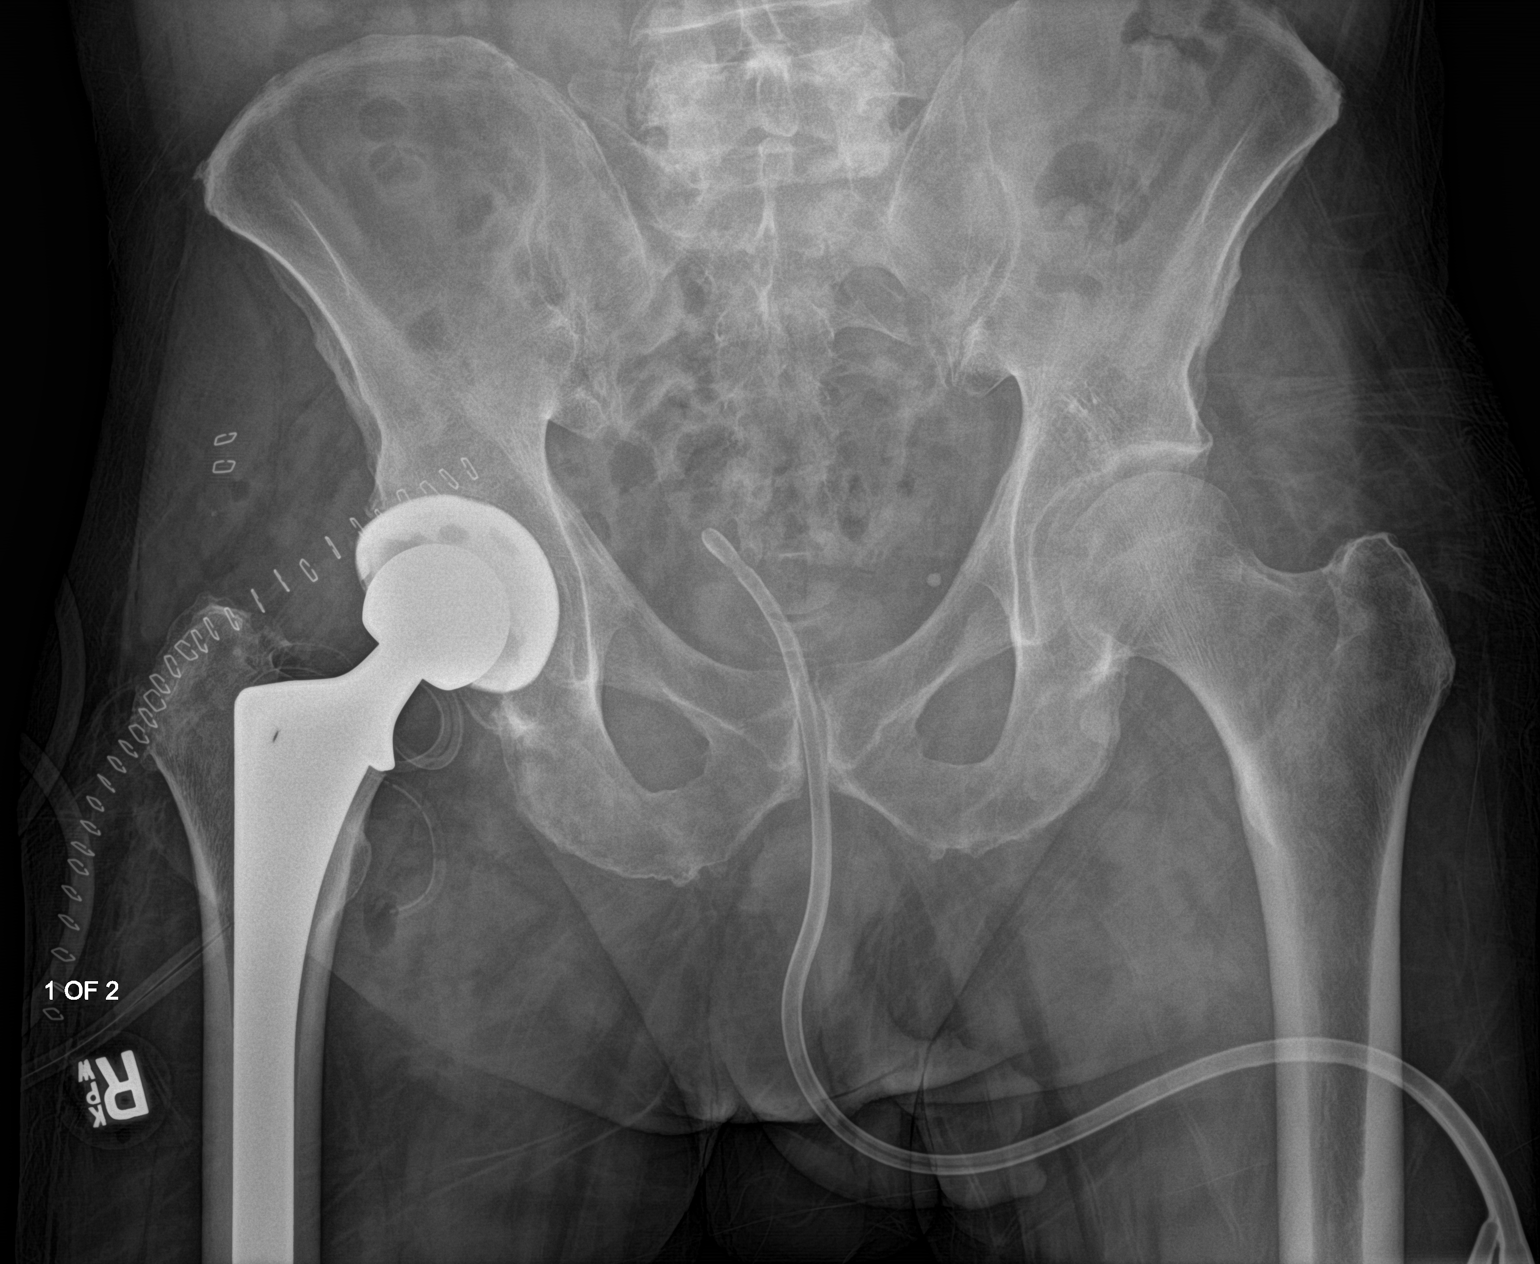

[hip lat]
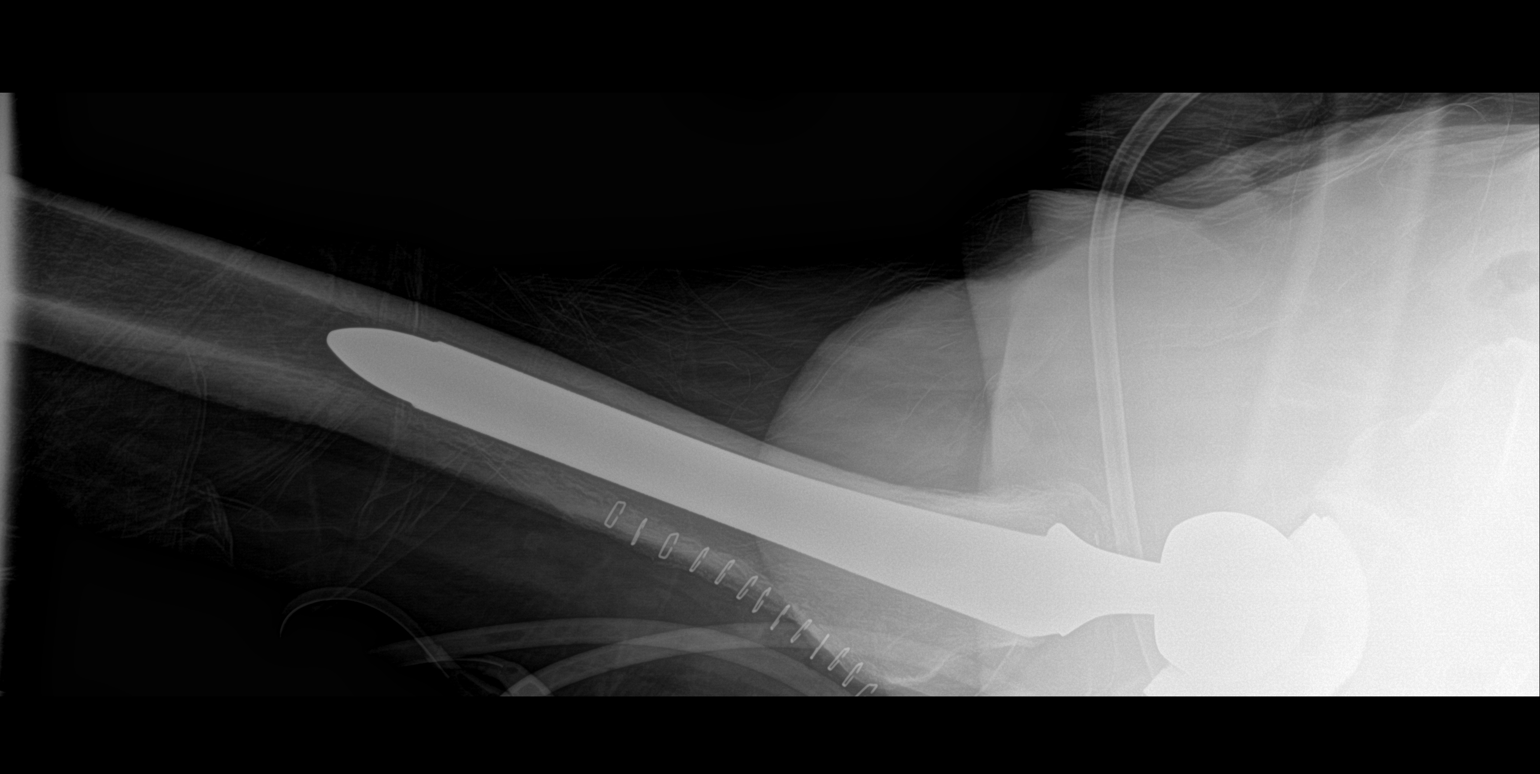

[pelvis ap (2 of 2)]
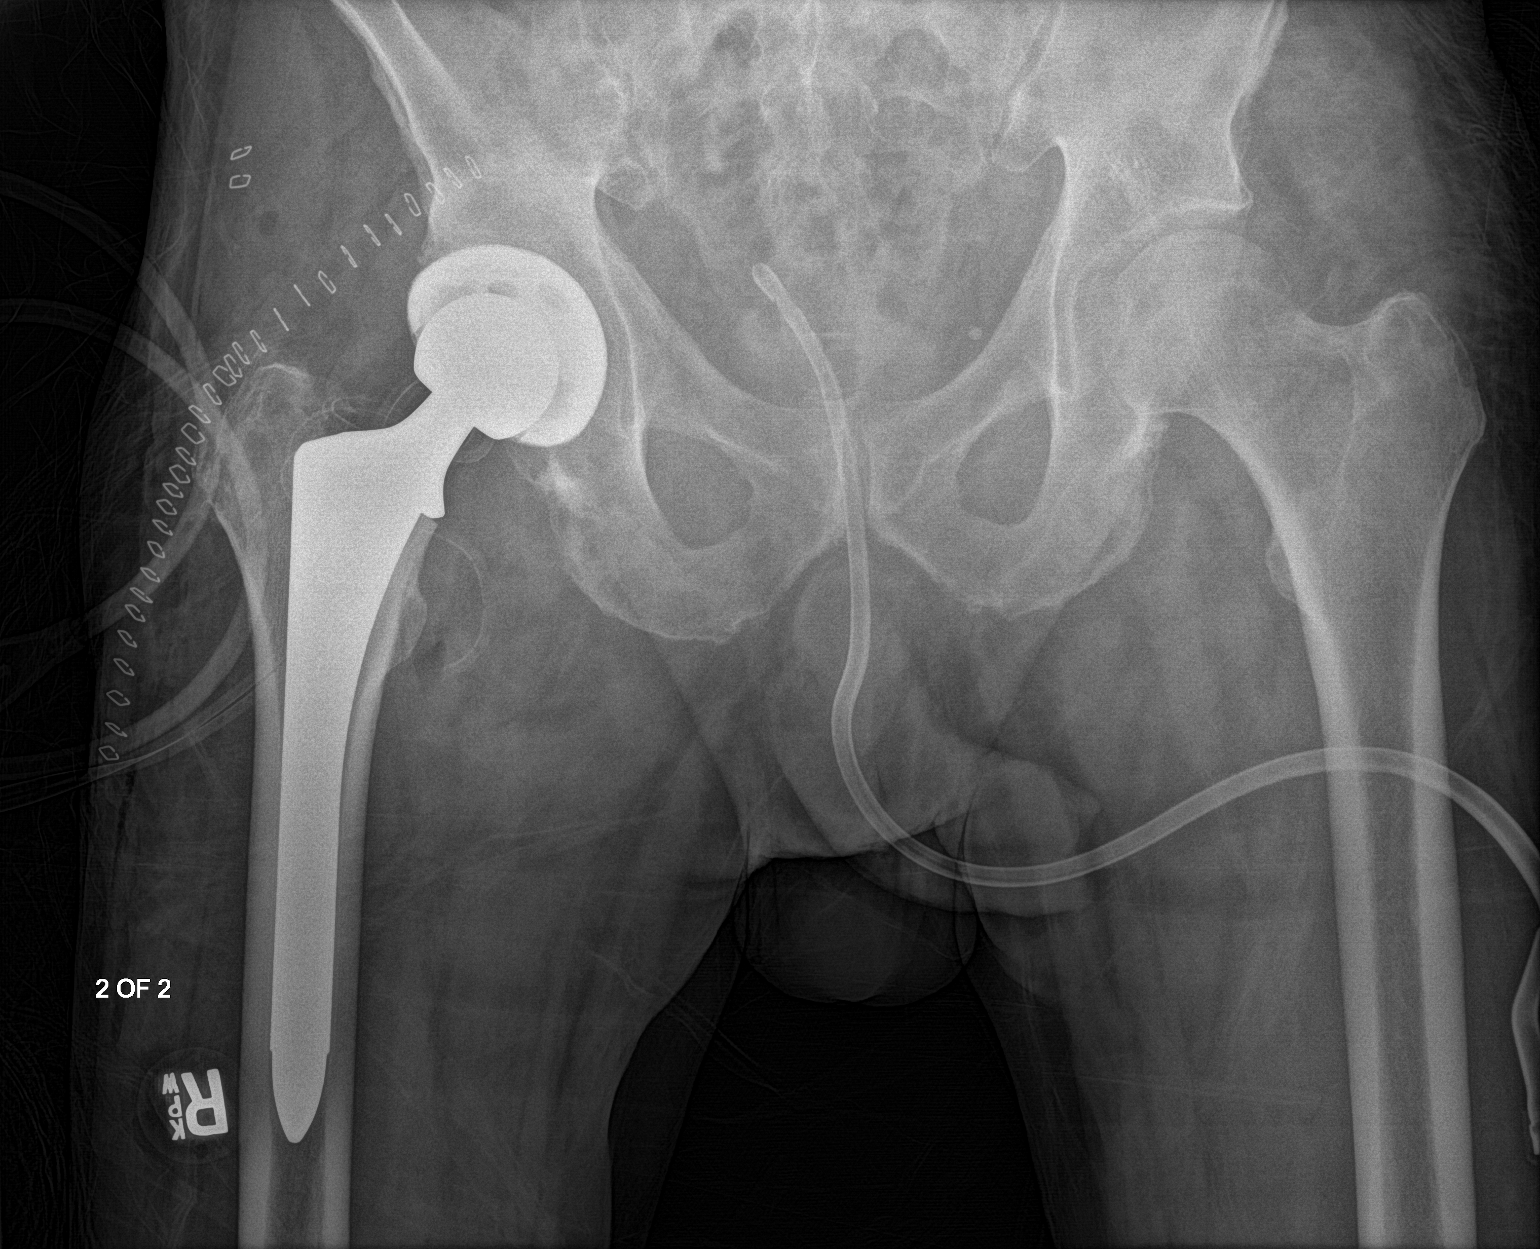

[3 of 3 positions shown; findings below may reference images not displayed]

FINDINGS: Status post total right hip arthroplasty. No perihardware lucency is
seen to indicate hardware failure or loosening. Expected
postoperative changes are seen including lateral surgical skin
staples soft tissue swelling, and subcutaneous air. There is a right
hip drain. The left femoroacetabular joint space is maintained. A
Foley catheter tip overlies the urinary bladder. Left hemipelvis
vascular phleboliths.
IMPRESSION: Status post total right hip arthroplasty without evidence of
hardware failure.

## 2023-05-26 ENCOUNTER — Encounter: Payer: Self-pay | Admitting: Internal Medicine

## 2023-06-07 ENCOUNTER — Other Ambulatory Visit: Payer: Self-pay | Admitting: Family Medicine

## 2023-06-07 DIAGNOSIS — N401 Enlarged prostate with lower urinary tract symptoms: Secondary | ICD-10-CM

## 2023-06-09 NOTE — Telephone Encounter (Signed)
 Rx 01/13/23 #90 3RF- 1 yr supply, too soon Requested Prescriptions  Pending Prescriptions Disp Refills   tamsulosin (FLOMAX) 0.4 MG CAPS capsule [Pharmacy Med Name: Tamsulosin HCl Oral Capsule 0.4 MG] 90 capsule 3    Sig: TAKE 1 CAPSULE EVERY DAY AFTER SUPPER     Urology: Alpha-Adrenergic Blocker Failed - 06/09/2023  4:03 PM      Failed - Valid encounter within last 12 months    Recent Outpatient Visits   None     Future Appointments             In 1 week Carman Ching, PA-C [facility] [place]   In 7 months Laural Benes, Oralia Rud, DO [facility]            Passed - PSA in normal range and within 360 days    Prostate Specific Ag, Serum  Date Value Ref Range Status  01/13/2023 1.2 0.0 - 4.0 ng/mL Final    Comment:    Roche ECLIA methodology. According to the American Urological Association, Serum PSA should decrease and remain at undetectable levels after radical prostatectomy. The AUA defines biochemical recurrence as an initial PSA value 0.2 ng/mL or greater followed by a subsequent confirmatory PSA value 0.2 ng/mL or greater. Values obtained with different assay methods or kits cannot be used interchangeably. Results cannot be interpreted as absolute evidence of the presence or absence of malignant disease.          Passed - Last BP in normal range    BP Readings from Last 1 Encounters:  03/02/23 110/70

## 2023-06-16 ENCOUNTER — Ambulatory Visit: Payer: Self-pay | Admitting: Physician Assistant

## 2023-06-24 NOTE — Progress Notes (Signed)
 Remote ICD transmission.

## 2023-08-21 ENCOUNTER — Ambulatory Visit (INDEPENDENT_AMBULATORY_CARE_PROVIDER_SITE_OTHER): Payer: Medicare HMO

## 2023-08-21 DIAGNOSIS — I428 Other cardiomyopathies: Secondary | ICD-10-CM

## 2023-08-21 LAB — CUP PACEART REMOTE DEVICE CHECK
Battery Remaining Longevity: 39 mo
Battery Remaining Percentage: 40 %
Battery Voltage: 2.92 V
Brady Statistic AP VP Percent: 1 %
Brady Statistic AP VS Percent: 11 %
Brady Statistic AS VP Percent: 2.5 %
Brady Statistic AS VS Percent: 79 %
Brady Statistic RA Percent Paced: 2.3 %
Brady Statistic RV Percent Paced: 2.7 %
Date Time Interrogation Session: 20250613040023
HighPow Impedance: 74 Ohm
HighPow Impedance: 74 Ohm
Implantable Lead Connection Status: 753985
Implantable Lead Connection Status: 753985
Implantable Lead Implant Date: 20190220
Implantable Lead Implant Date: 20190220
Implantable Lead Location: 753859
Implantable Lead Location: 753860
Implantable Lead Model: 7122
Implantable Pulse Generator Implant Date: 20190220
Lead Channel Impedance Value: 400 Ohm
Lead Channel Impedance Value: 440 Ohm
Lead Channel Pacing Threshold Amplitude: 0.75 V
Lead Channel Pacing Threshold Amplitude: 1 V
Lead Channel Pacing Threshold Pulse Width: 0.5 ms
Lead Channel Pacing Threshold Pulse Width: 0.5 ms
Lead Channel Sensing Intrinsic Amplitude: 1 mV
Lead Channel Sensing Intrinsic Amplitude: 11.8 mV
Lead Channel Setting Pacing Amplitude: 2 V
Lead Channel Setting Pacing Amplitude: 2.5 V
Lead Channel Setting Pacing Pulse Width: 0.5 ms
Lead Channel Setting Sensing Sensitivity: 0.5 mV
Pulse Gen Serial Number: 9787528

## 2023-09-28 ENCOUNTER — Encounter: Payer: Self-pay | Admitting: Cardiology

## 2023-09-28 DIAGNOSIS — I493 Ventricular premature depolarization: Secondary | ICD-10-CM | POA: Insufficient documentation

## 2023-09-28 DIAGNOSIS — I5022 Chronic systolic (congestive) heart failure: Secondary | ICD-10-CM | POA: Insufficient documentation

## 2023-09-28 NOTE — Progress Notes (Signed)
 Electrophysiology Clinic Note    Date:  09/29/2023  Patient ID:  Leroy Merritt Jul 14, 1945, MRN [id number] PCP:  Vicci Duwaine SQUIBB, DO  Cardiologist:  Evalene Lunger, MD   Electrophysiologist:  Dr. Fernande > Fonda Kitty, MD Electrophysiology APP:  Jaliza Seifried, NP    Discussed the use of AI scribe software for clinical note transcription with the patient, who gave verbal consent to proceed.   Patient Profile    Chief Complaint: dizziness  History of Present Illness: Leroy Merritt is a 78 y.o. 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   I last saw him 09/2022 where he was doing well.  He saw Dr. Gollan 02/2023.  On follow-up today, about 2-3 weeks ago he noticed he was having dizziness and LH with position changes. He was also having increased fatigue with palpitations. He does not check his BP regularly at home. No changes to medications, no missed doses of medications, no sick contacts. He denies chest pain, chest pressure, or edema.   He continues to take sotalol  BID, no missed doses.      Arrhythmia/Device History St. Jude dual chamber ICD , imp 04/2017; dx NICM, VT   AAD History: Sotalol       ROS:  Please see the history of present illness. All other systems are reviewed and otherwise negative.    Physical Exam    VS:  BP 130/82 (BP Location: Left Arm, Patient Position: Sitting, Cuff Size: Normal)   Pulse 69   Ht 5' 10 (1.778 m)   Wt 180 lb 6.4 oz (81.8 kg)   SpO2 98%   BMI 25.88 kg/m  BMI: Body mass index is 25.88 kg/m.  Orthostatic VS for the past 24 hrs (Last 3 readings):  BP- Lying Pulse- Lying BP- Sitting Pulse- Sitting BP- Standing at 0 minutes Pulse- Standing at 0 minutes BP- Standing at 3 minutes Pulse- Standing at 3 minutes  09/29/23 1051 116/77 63 112/73 73 112/76 75 120/83 72     Wt Readings from Last 3 Encounters:  09/29/23  180 lb 6.4 oz (81.8 kg)  03/02/23 185 lb 4 oz (84 kg)  01/13/23 182 lb 6.4 oz (82.7 kg)     GEN- The patient is well appearing, alert and oriented x 3 today.   Lungs- Clear to ausculation bilaterally, normal work of breathing.  Heart- Regular rate with ectopy and rhythm, no murmurs, rubs or gallops Extremities- No peripheral edema, warm, dry Skin-  device pocket well-healed, no tethering   Device interrogation done today and reviewed by myself:  Battery 2.8-3 years Atrial threshold slightly higher, programmed auto-threshold ON Ventricular threshold stable Impedence, sensing stable PVC 6.8%  Freq PVCs throughout interrogation One episode of AMS No further changes made today   Studies Reviewed   Previous EP, cardiology notes.    EKG is ordered. Personal review of EKG from today shows:    EKG Interpretation Date/Time:  Tuesday September 29 2023 10:43:36 EDT Ventricular Rate:  69 PR Interval:  308 QRS Duration:  82 QT Interval:  416 QTC Calculation: 445 R Axis:   -51  Text Interpretation: intermittent Atrial-paced rhythm with prolonged AV conduction with premature ventricular or aberrantly conducted complexes Left axis deviation Confirmed by Kemarion Abbey [phone]) on 09/29/2023 11:59:11 AM    TTE, 09/30/2022  1. Left ventricular ejection fraction, by estimation, is 45 to 50%. Left ventricular ejection  fraction by PLAX is 47 %. The left ventricle has mildly decreased function. The left ventricle demonstrates global hypokinesis. Left ventricular diastolic parameters are consistent with Grade I diastolic dysfunction (impaired relaxation). The average left ventricular global longitudinal strain is -16.7 %.   2. Right ventricular systolic function is normal. The right ventricular size is normal. Tricuspid regurgitation signal is inadequate for assessing PA pressure.   3. The mitral valve is normal in structure. Mild mitral valve regurgitation. No evidence of mitral stenosis.   4. The  aortic valve has an indeterminant number of cusps. Aortic valve  regurgitation is not visualized. No aortic stenosis is present.   5. The inferior vena cava is normal in size with greater than 50% respiratory variability, suggesting right atrial pressure of 3 mmHg.   TTE, 03/13/2020  1. Left ventricular ejection fraction, by estimation, is 45 to 50%. The left ventricle has mildly decreased function. The left ventricle demonstrates global hypokinesis. There is mild left ventricular hypertrophy. Left ventricular diastolic parameters are consistent with Grade I diastolic dysfunction (impaired relaxation). The average left ventricular global longitudinal strain is -12.7 %. The global longitudinal strain is abnormal.   2. Right ventricular systolic function is normal. The right ventricular size is normal. There is normal pulmonary artery systolic pressure.   3. The mitral valve is normal in structure. Mild mitral valve regurgitation. No evidence of mitral stenosis.   4. The aortic valve has an indeterminant number of cusps. There is mild thickening of the aortic valve. Aortic valve regurgitation is not visualized. Mild aortic valve sclerosis is present, with no evidence of aortic valve stenosis.   5. Aortic dilatation noted. 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    TTE, 03/22/2019 1. Left ventricular ejection fraction, by visual estimation, is 50 to 55%. The left ventricle has low normal function. There is no left ventricular hypertrophy.   2. 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   3. Left ventricular diastolic parameters are indeterminate.   4. The left ventricle has no regional wall motion abnormalities.   5. Global right ventricle has mildly reduced systolic function.The right ventricular size is normal. No increase in right ventricular wall thickness.   6. Right ventricular systolic pressure is  normal (79-74 mmHg plus central venous pressure).   7. Left atrial size was normal.   8. Right atrial size was normal.   9. The pericardium was not well visualized.  10. The mitral valve is grossly normal. Mild to moderate mitral valve regurgitation.  11. The tricuspid valve is not well visualized.  12. The aortic valve is tricuspid. Aortic valve regurgitation is not visualized. No evidence of aortic valve sclerosis or stenosis.  13. The pulmonic valve was not well visualized. Pulmonic valve regurgitation is trivial.  14. Aortic dilatation noted.  15. There is mild dilatation of the aortic root measuring 39 mm.  16. A pacer wire is visualized in the RV.  17. The interatrial septum was not well visualized.    TTE, 04/29/2017 - Left ventricle: The cavity size was normal. Wall thickness was normal. Systolic function was moderately to severely reduced. The estimated ejection fraction was in the range of 30% to 35%. Diffuse hypokinesis. Doppler parameters are consistent with abnormal left ventricular relaxation (grade 1 diastolic dysfunction). - Ventricular septum: Septal motion showed abnormal function and dyssynergy. - Aortic valve: Trileaflet; mildly thickened, mildly calcified leaflets.  Assessment and Plan     #) VT #) NICM #) ICD in situ #) sotalol  mgmt #) PVC #) HFmrEF  Significantly increased PVC burden on today's EKG and during device interrogation Patient having increased dizziness and fatigue that started 2-3 weeks ago, no chest pain or pressure.  Appears euvolemic on exam No NSVT or VT on device Update TTE to confirm stable LVEF. If LVEF reduced, will proceed with ischemic eval Continue 80mg  sotalol  BID at this time, consider switching to amiodarone  Update BMP, mag today        Current medicines are reviewed at length with the patient today.   The patient does not have concerns regarding his medicines.  The following changes were made today:  none  Labs/  tests ordered today include:  Orders Placed This Encounter  Procedures   Basic metabolic panel with GFR   Magnesium    EKG 12-Lead   ECHOCARDIOGRAM COMPLETE     Disposition: Follow up with Dr. Kennyth or EP APP in 6 weeks, sooner if needed    Signed, Chantal Needle, NP  09/29/23  12:07 PM  Electrophysiology [facility]

## 2023-09-29 ENCOUNTER — Other Ambulatory Visit
Admission: RE | Admit: 2023-09-29 | Discharge: 2023-09-29 | Disposition: A | Discharge status: Home / Self Care | Source: Ambulatory Visit | Attending: Cardiology | Admitting: Cardiology

## 2023-09-29 ENCOUNTER — Ambulatory Visit: Attending: Cardiology | Admitting: Cardiology

## 2023-09-29 VITALS — BP 130/82 | HR 69 | Ht 70.0 in | Wt 180.4 lb

## 2023-09-29 DIAGNOSIS — I472 Ventricular tachycardia, unspecified: Secondary | ICD-10-CM | POA: Insufficient documentation

## 2023-09-29 DIAGNOSIS — Z95810 Presence of automatic (implantable) cardiac defibrillator: Secondary | ICD-10-CM | POA: Insufficient documentation

## 2023-09-29 DIAGNOSIS — I493 Ventricular premature depolarization: Secondary | ICD-10-CM | POA: Insufficient documentation

## 2023-09-29 DIAGNOSIS — I5022 Chronic systolic (congestive) heart failure: Secondary | ICD-10-CM

## 2023-09-29 LAB — CUP PACEART INCLINIC DEVICE CHECK
Battery Remaining Longevity: 40 mo
Brady Statistic RA Percent Paced: 2.3 %
Brady Statistic RV Percent Paced: 2.8 %
Date Time Interrogation Session: 20250722121834
HighPow Impedance: 76.5 Ohm
Implantable Lead Connection Status: 753985
Implantable Lead Connection Status: 753985
Implantable Lead Implant Date: 20190220
Implantable Lead Implant Date: 20190220
Implantable Lead Location: 753859
Implantable Lead Location: 753860
Implantable Lead Model: 7122
Implantable Pulse Generator Implant Date: 20190220
Lead Channel Impedance Value: 350 Ohm
Lead Channel Impedance Value: 412.5 Ohm
Lead Channel Pacing Threshold Amplitude: 0.75 V
Lead Channel Pacing Threshold Amplitude: 0.75 V
Lead Channel Pacing Threshold Amplitude: 1.125 V
Lead Channel Pacing Threshold Amplitude: 1.25 V
Lead Channel Pacing Threshold Pulse Width: 0.5 ms
Lead Channel Pacing Threshold Pulse Width: 0.5 ms
Lead Channel Pacing Threshold Pulse Width: 0.5 ms
Lead Channel Pacing Threshold Pulse Width: 0.5 ms
Lead Channel Sensing Intrinsic Amplitude: 1.2 mV
Lead Channel Sensing Intrinsic Amplitude: 11.8 mV
Lead Channel Setting Pacing Amplitude: 2.125
Lead Channel Setting Pacing Amplitude: 2.5 V
Lead Channel Setting Pacing Pulse Width: 0.5 ms
Lead Channel Setting Sensing Sensitivity: 0.5 mV
Pulse Gen Serial Number: 9787528

## 2023-09-29 LAB — BASIC METABOLIC PANEL WITH GFR
Anion gap: 10 (ref 5–15)
BUN: 15 mg/dL (ref 8–23)
CO2: 25 mmol/L (ref 22–32)
Calcium: 9.1 mg/dL (ref 8.9–10.3)
Chloride: 105 mmol/L (ref 98–111)
Creatinine, Ser: 1.11 mg/dL (ref 0.61–1.24)
GFR, Estimated: >60 mL/min
Glucose, Bld: 95 mg/dL (ref 70–99)
Potassium: 4.3 mmol/L (ref 3.5–5.1)
Sodium: 140 mmol/L (ref 135–145)

## 2023-09-29 LAB — MAGNESIUM: Magnesium: 2.3 mg/dL (ref 1.7–2.4)

## 2023-09-29 NOTE — Patient Instructions (Signed)
 Medication Instructions:  The current medical regimen is effective;  continue present plan and medications as directed. Please refer to the Current Medication list given to you today.   *If you need a refill on your cardiac medications before your next appointment, please call your pharmacy*  Lab Work: Your provider would like for you to have following labs drawn today BMET, MAG.   If you have labs (blood work) drawn today and your tests are completely normal, you will receive your results only by: MyChart Message (if you have MyChart) OR A paper copy in the mail If you have any lab test that is abnormal or we need to change your treatment, we will call you to review the results.  Testing/Procedures:  Your physician has requested that you have an echocardiogram. Echocardiography is a painless test that uses sound waves to create images of your heart. It provides your doctor with information about the size and shape of your heart and how well your heart's chambers and valves are working.   You may receive an ultrasound enhancing agent through an IV if needed to better visualize your heart during the echo. This procedure takes approximately one hour.  There are no restrictions for this procedure.  This will take place at [address] [facility] [address]) [address], [place] [address]  Please note: We ask at that you not bring children with you during ultrasound (echo/ vascular) testing. Due to room size and safety concerns, children are not allowed in the ultrasound rooms during exams. Our front office staff cannot provide observation of children in our lobby area while testing is being conducted. An adult accompanying a patient to their appointment will only be allowed in the ultrasound room at the discretion of the ultrasound technician under special circumstances. We apologize for any inconvenience.   Follow-Up: At [facility], you and your health needs are our priority.  As  part of our continuing mission to provide you with exceptional heart care, our providers are all part of one team.  This team includes your primary Cardiologist (physician) and Advanced Practice Providers or APPs (Physician Assistants and Nurse Practitioners) who all work together to provide you with the care you need, when you need it.  Your next appointment:   6 week(s)  Provider:   Ole Holts, MD or Suzann Riddle, NP   We recommend signing up for the patient portal called MyChart.  Sign up information is provided on this After Visit Summary.  MyChart is used to connect with patients for Virtual Visits (Telemedicine).  Patients are able to view lab/test results, encounter notes, upcoming appointments, etc.  Non-urgent messages can be sent to your provider as well.   To learn more about what you can do with MyChart, go to [email or web address].

## 2023-09-30 ENCOUNTER — Ambulatory Visit: Payer: Self-pay | Admitting: Cardiology

## 2023-10-08 NOTE — Progress Notes (Signed)
 Remote ICD transmission.

## 2023-10-11 ENCOUNTER — Other Ambulatory Visit: Payer: Self-pay | Admitting: Internal Medicine

## 2023-10-12 ENCOUNTER — Other Ambulatory Visit: Payer: Self-pay

## 2023-10-12 MED ORDER — EPLERENONE 25 MG PO TABS: 25.0000 mg | ORAL_TABLET | Freq: Every day | ORAL | 3 refills | Status: AC

## 2023-10-16 IMAGING — DX DG KNEE 1-2V PORT*L*
2 series · 2 of 2 positions shown · non-contrast
Comparison: None Available.

CLINICAL DATA: Left knee replacement

EXAM:
PORTABLE LEFT KNEE - 1-2 VIEW

[knee ap]
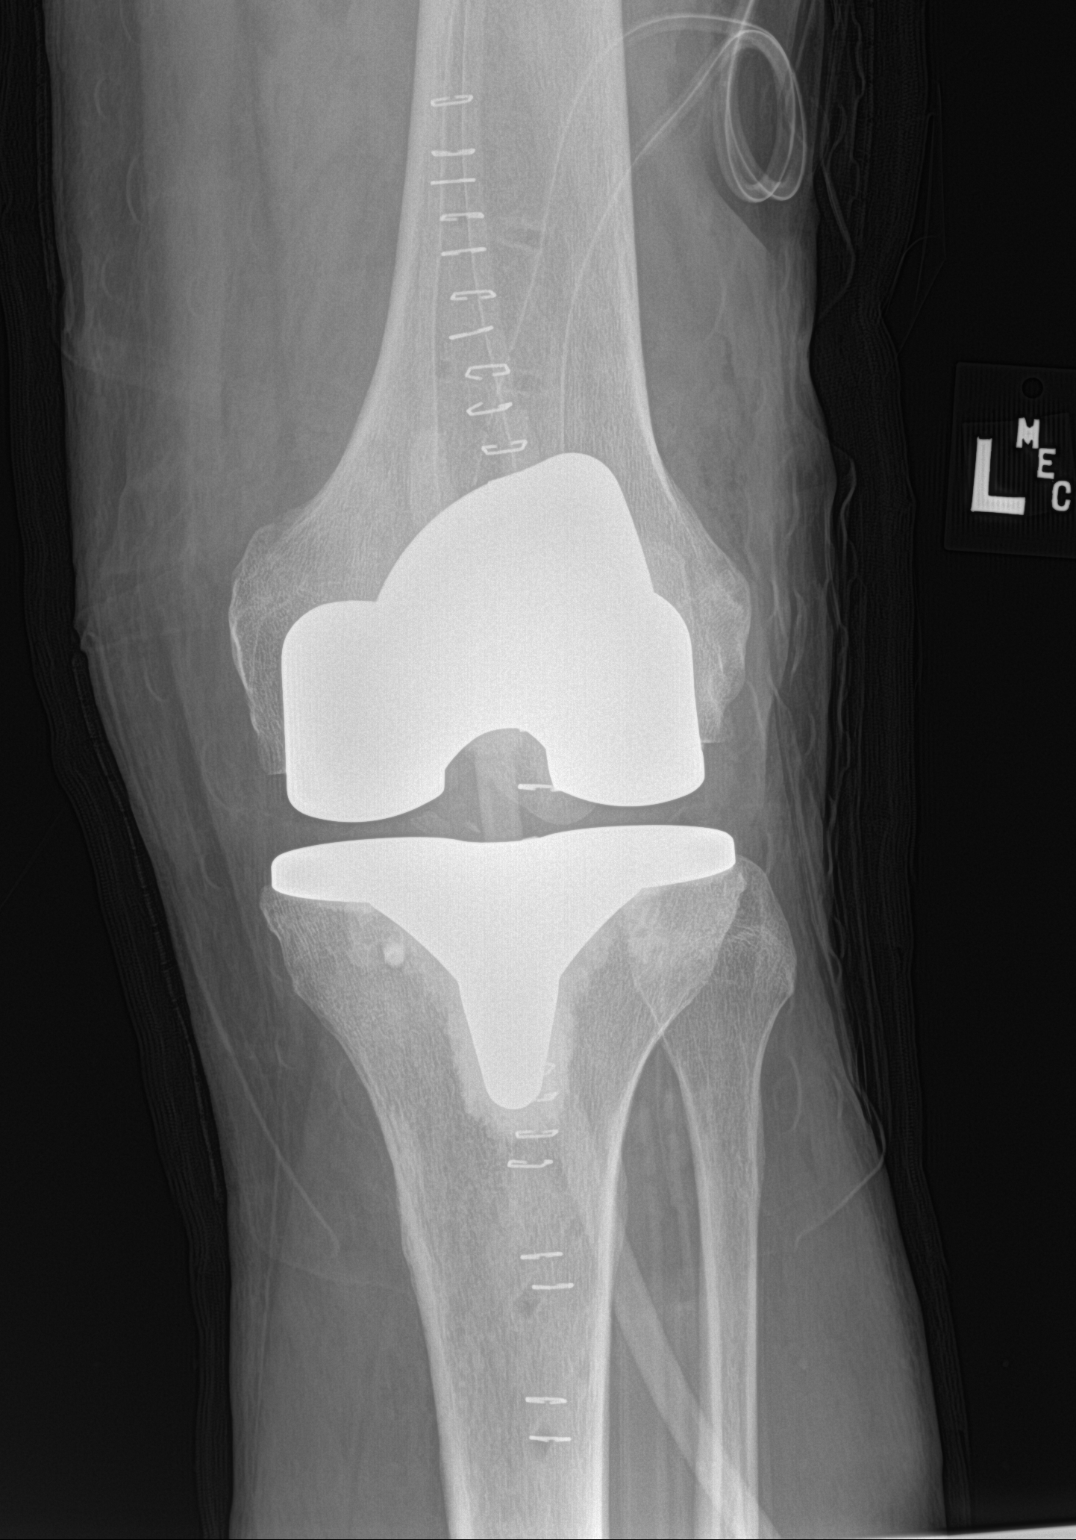

[knee lat]
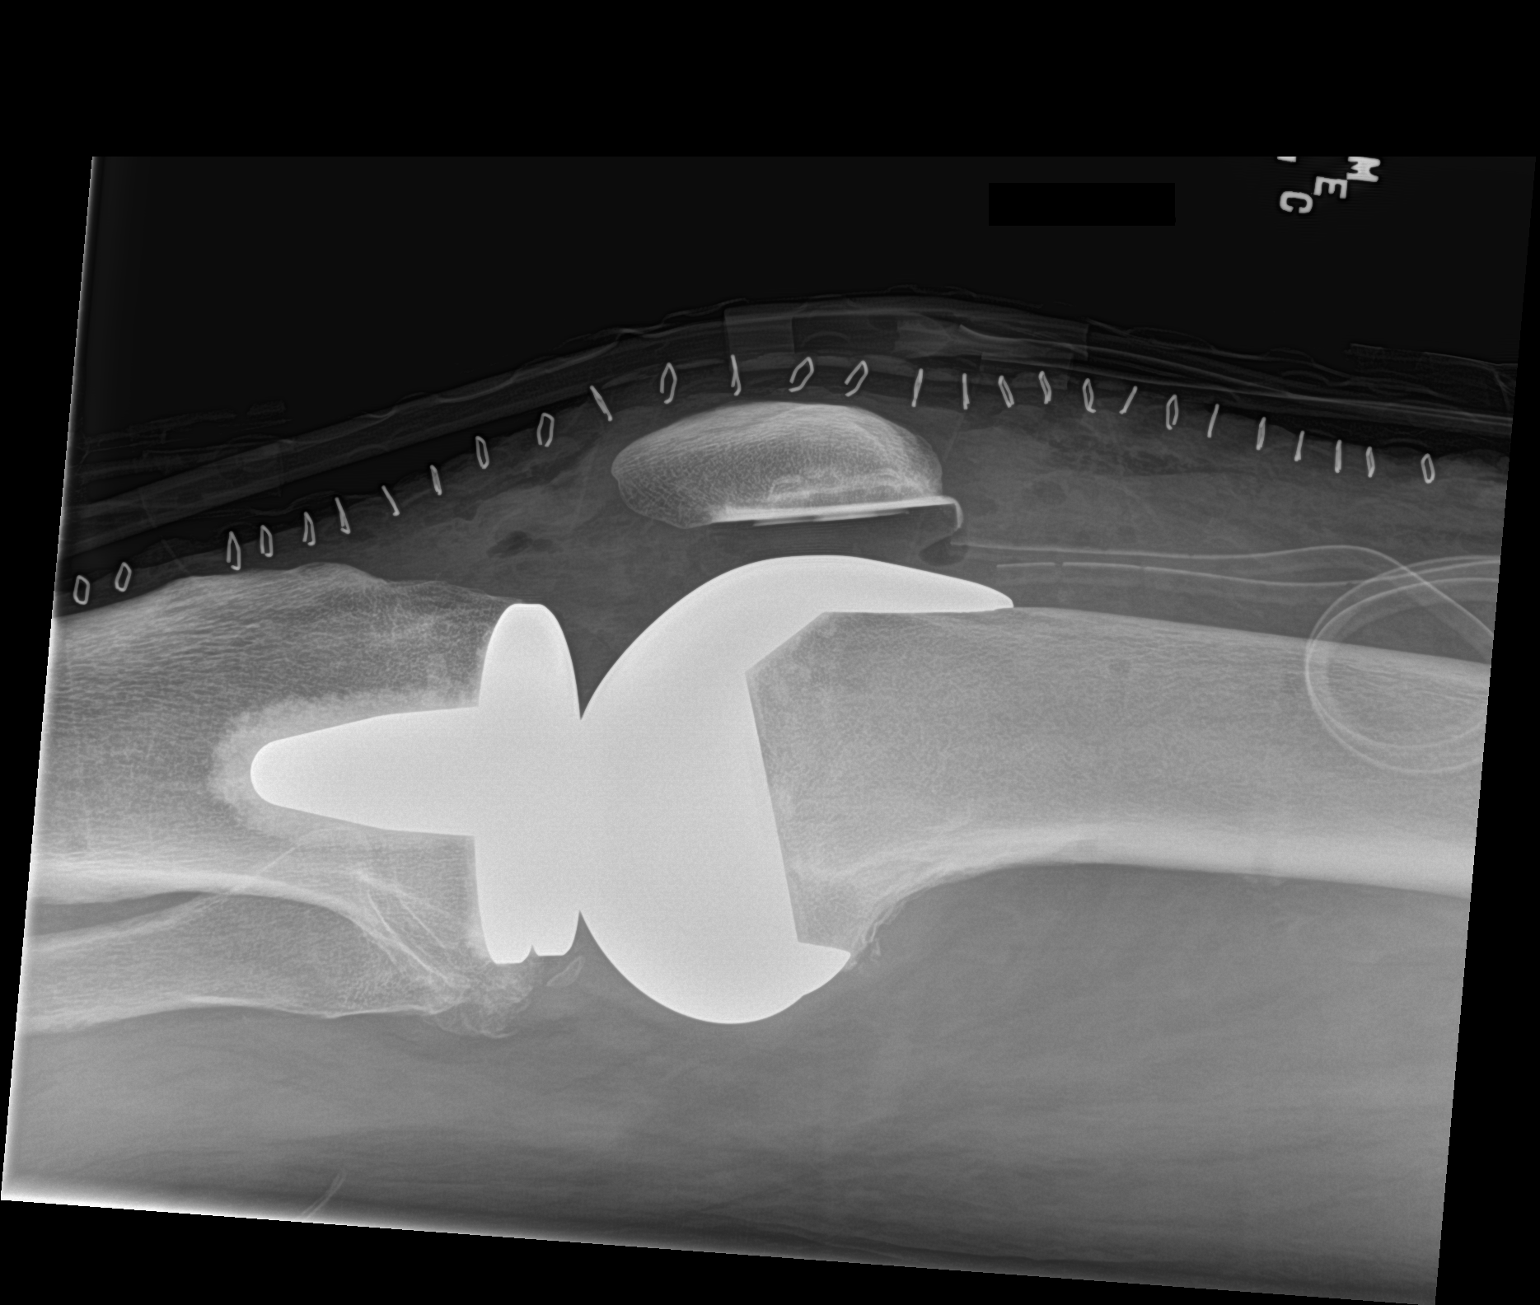

[2 of 2 positions shown; findings below may reference images not displayed]

FINDINGS: Postsurgical changes from left total knee arthroplasty. Arthroplasty
components are in their expected alignment. No periprosthetic
fracture or evidence of other complication. Expected postoperative
changes within the overlying soft tissues with surgical drains in
the suprapatellar region.
IMPRESSION: Expected postoperative changes from left total knee arthroplasty.

## 2023-11-16 ENCOUNTER — Other Ambulatory Visit

## 2023-11-20 ENCOUNTER — Ambulatory Visit: Payer: Medicare HMO

## 2023-11-20 DIAGNOSIS — I5022 Chronic systolic (congestive) heart failure: Secondary | ICD-10-CM

## 2023-11-20 LAB — CUP PACEART REMOTE DEVICE CHECK
Battery Remaining Longevity: 35 mo
Battery Remaining Percentage: 37 %
Battery Voltage: 2.92 V
Brady Statistic AP VP Percent: 1 %
Brady Statistic AP VS Percent: 20 %
Brady Statistic AS VP Percent: 3.6 %
Brady Statistic AS VS Percent: 64 %
Brady Statistic RA Percent Paced: 3.2 %
Brady Statistic RV Percent Paced: 4.5 %
Date Time Interrogation Session: 20250912040018
HighPow Impedance: 77 Ohm
HighPow Impedance: 77 Ohm
Implantable Lead Connection Status: 753985
Implantable Lead Connection Status: 753985
Implantable Lead Implant Date: 20190220
Implantable Lead Implant Date: 20190220
Implantable Lead Location: 753859
Implantable Lead Location: 753860
Implantable Lead Model: 7122
Implantable Pulse Generator Implant Date: 20190220
Lead Channel Impedance Value: 390 Ohm
Lead Channel Impedance Value: 430 Ohm
Lead Channel Pacing Threshold Amplitude: 0.75 V
Lead Channel Pacing Threshold Amplitude: 1.125 V
Lead Channel Pacing Threshold Pulse Width: 0.5 ms
Lead Channel Pacing Threshold Pulse Width: 0.5 ms
Lead Channel Sensing Intrinsic Amplitude: 1.3 mV
Lead Channel Sensing Intrinsic Amplitude: 11.8 mV
Lead Channel Setting Pacing Amplitude: 2.125
Lead Channel Setting Pacing Amplitude: 2.5 V
Lead Channel Setting Pacing Pulse Width: 0.5 ms
Lead Channel Setting Sensing Sensitivity: 0.5 mV
Pulse Gen Serial Number: 9787528

## 2023-11-24 ENCOUNTER — Ambulatory Visit: Admitting: Cardiology

## 2023-11-27 NOTE — Progress Notes (Signed)
Remote ICD Transmission.

## 2023-12-04 ENCOUNTER — Other Ambulatory Visit: Payer: Self-pay | Admitting: Cardiovascular Disease

## 2023-12-04 DIAGNOSIS — I428 Other cardiomyopathies: Secondary | ICD-10-CM

## 2023-12-04 DIAGNOSIS — I5022 Chronic systolic (congestive) heart failure: Secondary | ICD-10-CM

## 2023-12-05 ENCOUNTER — Ambulatory Visit: Payer: Self-pay | Admitting: Cardiology

## 2023-12-15 ENCOUNTER — Ambulatory Visit: Attending: Cardiology

## 2023-12-15 DIAGNOSIS — Z95810 Presence of automatic (implantable) cardiac defibrillator: Secondary | ICD-10-CM | POA: Diagnosis not present

## 2023-12-15 DIAGNOSIS — I5022 Chronic systolic (congestive) heart failure: Secondary | ICD-10-CM | POA: Diagnosis not present

## 2023-12-15 DIAGNOSIS — I493 Ventricular premature depolarization: Secondary | ICD-10-CM | POA: Diagnosis not present

## 2023-12-15 DIAGNOSIS — I472 Ventricular tachycardia, unspecified: Secondary | ICD-10-CM

## 2023-12-15 NOTE — Progress Notes (Unsigned)
 Electrophysiology Clinic Note    Date:  12/16/2023  Patient ID:  Leroy Merritt, Leroy Merritt 01/31/1946, MRN [id number] PCP:  Vicci Duwaine SQUIBB, DO  Cardiologist:  Evalene Lunger, MD   Electrophysiologist:  Dr. Fernande > Fonda Kitty, MD Electrophysiology APP:  Zavier Canela, NP    Discussed the use of AI scribe software for clinical note transcription with the patient, who gave verbal consent to proceed.   Patient Profile    Chief Complaint: dizziness  History of Present Illness: Leroy Merritt is a 78 y.o. 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   I saw him 09/2023 for acute visit for dizziness, LH with position changes, increased fatigue and palps. He was having significantly elevated PVC burden on EKG, recommended updated TTE.   On follow-up today, he continues to have fatigue with decreased exercise tolerance. He denies chest pain, chest pressure.   He continues to take sotalol  BID, no missed doses.    His wife joins for visit.    Arrhythmia/Device History St. Jude dual chamber ICD , imp 04/2017; dx NICM, VT   AAD History: Sotalol       ROS:  Please see the history of present illness. All other systems are reviewed and otherwise negative.    Physical Exam    VS:  BP 118/60 (BP Location: Left Arm, Patient Position: Sitting, Cuff Size: Normal)   Pulse 72   Ht 5' 10 (1.778 m)   Wt 177 lb 6.4 oz (80.5 kg)   SpO2 97%   BMI 25.45 kg/m  BMI: Body mass index is 25.45 kg/m.       Wt Readings from Last 3 Encounters:  12/16/23 177 lb 6.4 oz (80.5 kg)  09/29/23 180 lb 6.4 oz (81.8 kg)  03/02/23 185 lb 4 oz (84 kg)     GEN- The patient is well appearing, alert and oriented x 3 today.   Lungs- Clear to ausculation bilaterally, normal work of breathing.  Heart- Regular rate with ectopy and rhythm, no murmurs, rubs or gallops Extremities- No peripheral edema,  warm, dry Skin-  device pocket well-healed, no tethering   Device interrogation not performed today.    Studies Reviewed   Previous EP, cardiology notes.    EKG is ordered. Personal review of EKG from today shows:         TTE, 09/30/2022  1. Left ventricular ejection fraction, by estimation, is 45 to 50%. Left ventricular ejection fraction by PLAX is 47 %. The left ventricle has mildly decreased function. The left ventricle demonstrates global hypokinesis. Left ventricular diastolic parameters are consistent with Grade I diastolic dysfunction (impaired relaxation). The average left ventricular global longitudinal strain is -16.7 %.   2. Right ventricular systolic function is normal. The right ventricular size is normal. Tricuspid regurgitation signal is inadequate for assessing PA pressure.   3. The mitral valve is normal in structure. Mild mitral valve regurgitation. No evidence of mitral stenosis.   4. The aortic valve has an indeterminant number of cusps. Aortic valve  regurgitation is not visualized. No aortic stenosis is present.   5. The inferior vena cava is normal in size with greater than 50% respiratory variability, suggesting right atrial pressure of 3 mmHg.   TTE, 03/13/2020  1. Left ventricular ejection fraction, by estimation, is 45 to 50%. The left ventricle has mildly decreased function. The left ventricle demonstrates global hypokinesis.  There is mild left ventricular hypertrophy. Left ventricular diastolic parameters are consistent with Grade I diastolic dysfunction (impaired relaxation). The average left ventricular global longitudinal strain is -12.7 %. The global longitudinal strain is abnormal.   2. Right ventricular systolic function is normal. The right ventricular size is normal. There is normal pulmonary artery systolic pressure.   3. The mitral valve is normal in structure. Mild mitral valve regurgitation. No evidence of mitral stenosis.   4. The aortic valve has an  indeterminant number of cusps. There is mild thickening of the aortic valve. Aortic valve regurgitation is not visualized. Mild aortic valve sclerosis is present, with no evidence of aortic valve stenosis.   5. Aortic dilatation noted. 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    TTE, 03/22/2019 1. Left ventricular ejection fraction, by visual estimation, is 50 to 55%. The left ventricle has low normal function. There is no left ventricular hypertrophy.   2. 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   3. Left ventricular diastolic parameters are indeterminate.   4. The left ventricle has no regional wall motion abnormalities.   5. Global right ventricle has mildly reduced systolic function. The right ventricular size is normal. No increase in right ventricular wall thickness.   6. Right ventricular systolic pressure is normal (20-25 mmHg plus central venous pressure).   7. Left atrial size was normal.   8. Right atrial size was normal.   9. The pericardium was not well visualized.  10. The mitral valve is grossly normal. Mild to moderate mitral valve regurgitation.  11. The tricuspid valve is not well visualized.  12. The aortic valve is tricuspid. Aortic valve regurgitation is not visualized. No evidence of aortic valve sclerosis or stenosis.  13. The pulmonic valve was not well visualized. Pulmonic valve regurgitation is trivial.  14. Aortic dilatation noted.  15. There is mild dilatation of the aortic root measuring 39 mm.  16. A pacer wire is visualized in the RV.  17. The interatrial septum was not well visualized.    TTE, 04/29/2017 - Left ventricle: The cavity size was normal. Wall thickness was normal. Systolic function was moderately to severely reduced. The estimated ejection fraction was in the range of 30% to 35%. Diffuse hypokinesis. Doppler parameters are consistent with abnormal left  ventricular relaxation (grade 1 diastolic dysfunction). - Ventricular septum: Septal motion showed abnormal function and dyssynergy. - Aortic valve: Trileaflet; mildly thickened, mildly calcified leaflets.   Assessment and Plan     #) VT #) NICM #) ICD in situ #) sotalol  mgmt #) PVC #) HFmrEF  Continues to have elevated PVC burden on EKG with increased fatigue.  Updated TTE with stable mid-range LVEF Will stop sotalol  and transition to amiodarone Discussed with pharmacy, who recommended 2 days of wash out between initiating amiodarone. He took sotalol  this AM, will take this evening's dose. Hold sotalol  10/9 and 10/10. Start 200mg  amiodarone 10/11 BID x 2 weeks, then reduce to 200mg  daily Update LFT, thyroid  labs today         Current medicines are reviewed at length with the patient today.   The patient does not have concerns regarding his medicines.  The following changes were made today:  none  Labs/ tests ordered today include:  Orders Placed This Encounter  Procedures   Hepatic function panel   T4, free   TSH   EKG 12-Lead  Disposition: Follow up with Dr. Kennyth or EP APP 4-6 weeks    Signed, Chantal Needle, NP  12/16/23  12:05 PM  Electrophysiology [facility]

## 2023-12-16 ENCOUNTER — Ambulatory Visit: Attending: Cardiology | Admitting: Cardiology

## 2023-12-16 ENCOUNTER — Encounter: Payer: Self-pay | Admitting: Cardiology

## 2023-12-16 VITALS — BP 118/60 | HR 72 | Ht 70.0 in | Wt 177.4 lb

## 2023-12-16 DIAGNOSIS — I493 Ventricular premature depolarization: Secondary | ICD-10-CM | POA: Diagnosis not present

## 2023-12-16 DIAGNOSIS — Z95810 Presence of automatic (implantable) cardiac defibrillator: Secondary | ICD-10-CM

## 2023-12-16 DIAGNOSIS — I472 Ventricular tachycardia, unspecified: Secondary | ICD-10-CM

## 2023-12-16 DIAGNOSIS — I5022 Chronic systolic (congestive) heart failure: Secondary | ICD-10-CM | POA: Diagnosis not present

## 2023-12-16 DIAGNOSIS — I428 Other cardiomyopathies: Secondary | ICD-10-CM | POA: Diagnosis not present

## 2023-12-16 LAB — ECHOCARDIOGRAM COMPLETE
Area-P 1/2: 3.06 cm2
Calc EF: 40.7 %
S' Lateral: 3 cm
Single Plane A2C EF: 47 %
Single Plane A4C EF: 33.4 %

## 2023-12-16 MED ORDER — AMIODARONE HCL 200 MG PO TABS
ORAL_TABLET | ORAL | 0 refills | Status: DC
Start: 2023-12-16 — End: 2023-12-16

## 2023-12-16 MED ORDER — AMIODARONE HCL 200 MG PO TABS
ORAL_TABLET | ORAL | 0 refills | Status: DC
Start: 2023-12-16 — End: 2024-01-20

## 2023-12-16 NOTE — Patient Instructions (Signed)
 Medication Instructions:  - STOP sotalol  then in TWO days .SABRA.  - BEGIN amiodarone  - 200 mg TWICE daily for 14 days   - followed by ...  - 200 mg ONCE daily thereafter  *If you need a refill on your cardiac medications before your next appointment, please call your pharmacy*  Lab Work: Your provider would like for you to have following labs drawn today LFT, TSH, T4.   If you have labs (blood work) drawn today and your tests are completely normal, you will receive your results only by: MyChart Message (if you have MyChart) OR A paper copy in the mail If you have any lab test that is abnormal or we need to change your treatment, we will call you to review the results.  Testing/Procedures: No test ordered today   Follow-Up: At [facility], you and your health needs are our priority.  As part of our continuing mission to provide you with exceptional heart care, our providers are all part of one team.  This team includes your primary Cardiologist (physician) and Advanced Practice Providers or APPs (Physician Assistants and Nurse Practitioners) who all work together to provide you with the care you need, when you need it.  Your next appointment:   4 week(s)  Provider:   Suzann Riddle, NP    We recommend signing up for the patient portal called MyChart.  Sign up information is provided on this After Visit Summary.  MyChart is used to connect with patients for Virtual Visits (Telemedicine).  Patients are able to view lab/test results, encounter notes, upcoming appointments, etc.  Non-urgent messages can be sent to your provider as well.   To learn more about what you can do with MyChart, go to [email or web address].

## 2023-12-17 ENCOUNTER — Ambulatory Visit: Payer: Self-pay | Admitting: Cardiology

## 2023-12-17 LAB — HEPATIC FUNCTION PANEL
ALT: 12 IU/L (ref 0–44)
AST: 16 IU/L (ref 0–40)
Albumin: 4.2 g/dL (ref 3.8–4.8)
Alkaline Phosphatase: 62 IU/L (ref 47–123)
Bilirubin Total: 0.7 mg/dL (ref 0.0–1.2)
Bilirubin, Direct: 0.2 mg/dL (ref 0.00–0.40)
Total Protein: 6.7 g/dL (ref 6.0–8.5)

## 2023-12-17 LAB — T4, FREE: Free T4: 1.21 ng/dL (ref 0.82–1.77)

## 2023-12-17 LAB — TSH: TSH: 2.16 u[IU]/mL (ref 0.450–4.500)

## 2024-01-02 ENCOUNTER — Ambulatory Visit: Payer: Self-pay | Admitting: Cardiology

## 2024-01-06 ENCOUNTER — Encounter: Payer: Self-pay | Admitting: Family Medicine

## 2024-01-14 ENCOUNTER — Encounter: Payer: Self-pay | Admitting: Family Medicine

## 2024-01-15 ENCOUNTER — Telehealth: Payer: Self-pay | Admitting: Family Medicine

## 2024-01-15 NOTE — Telephone Encounter (Signed)
 Copied from CRM [id number]. Topic: Clinical - Lab/Test Results >> Jan 15, 2024  9:32 AM Leroy Merritt wrote: Reason for CRM: patient called, wants to add psa to blood tests

## 2024-01-18 ENCOUNTER — Other Ambulatory Visit: Payer: Self-pay | Admitting: Family Medicine

## 2024-01-18 DIAGNOSIS — N401 Enlarged prostate with lower urinary tract symptoms: Secondary | ICD-10-CM

## 2024-01-18 DIAGNOSIS — K2100 Gastro-esophageal reflux disease with esophagitis, without bleeding: Secondary | ICD-10-CM

## 2024-01-18 NOTE — Telephone Encounter (Signed)
 FYI to provider for upcoming visit.

## 2024-01-19 NOTE — Telephone Encounter (Signed)
 Requested Prescriptions  Pending Prescriptions Disp Refills   omeprazole  (PRILOSEC) 20 MG capsule [Pharmacy Med Name: OMEPRAZOLE  DR 20 MG Oral Capsule Delayed Release] 90 capsule 0    Sig: TAKE 1 CAPSULE EVERY MORNING     Gastroenterology: Proton Pump Inhibitors Failed - 01/19/2024  4:55 PM      Failed - Valid encounter within last 12 months    Recent Outpatient Visits   None     Future Appointments             Tomorrow Riddle, Suzann, NP [facility] [place]   In 2 days Leroy Duwaine SQUIBB, DO [facility], [address]             tamsulosin  (FLOMAX ) 0.4 MG CAPS capsule [Pharmacy Med Name: TAMSULOSIN  HYDROCHLORIDE 0.4 MG Oral Capsule] 90 capsule 3    Sig: TAKE 1 CAPSULE EVERY DAY     Urology: Alpha-Adrenergic Blocker Failed - 01/19/2024  4:55 PM      Failed - PSA in normal range and within 360 days    Prostate Specific Ag, Serum  Date Value Ref Range Status  01/13/2023 1.2 0.0 - 4.0 ng/mL Final    Comment:    Roche ECLIA methodology. According to the American Urological Association, Serum PSA should decrease and remain at undetectable levels after radical prostatectomy. The AUA defines biochemical recurrence as an initial PSA value 0.2 ng/mL or greater followed by a subsequent confirmatory PSA value 0.2 ng/mL or greater. Values obtained with different assay methods or kits cannot be used interchangeably. Results cannot be interpreted as absolute evidence of the presence or absence of malignant disease.          Failed - Valid encounter within last 12 months    Recent Outpatient Visits   None     Future Appointments             Tomorrow Riddle, Suzann, NP [facility] [place]   In 2 days Leroy Duwaine SQUIBB, DO [place] [facility], [address]            Passed - Last BP in normal range    BP Readings from Last 1 Encounters:  12/16/23 118/60          finasteride  (PROSCAR ) 5 MG tablet  [Pharmacy Med Name: FINASTERIDE  5 MG Oral Tablet] 90 tablet 3    Sig: TAKE 1 TABLET AT BEDTIME     Urology: 5-alpha Reductase Inhibitors Failed - 01/19/2024  4:55 PM      Failed - PSA in normal range and within 360 days    Prostate Specific Ag, Serum  Date Value Ref Range Status  01/13/2023 1.2 0.0 - 4.0 ng/mL Final    Comment:    Roche ECLIA methodology. According to the American Urological Association, Serum PSA should decrease and remain at undetectable levels after radical prostatectomy. The AUA defines biochemical recurrence as an initial PSA value 0.2 ng/mL or greater followed by a subsequent confirmatory PSA value 0.2 ng/mL or greater. Values obtained with different assay methods or kits cannot be used interchangeably. Results cannot be interpreted as absolute evidence of the presence or absence of malignant disease.          Failed - Valid encounter within last 12 months    Recent Outpatient Visits   None     Future Appointments             Tomorrow Riddle, Suzann, NP  [facility] [place]   In 2 days Leroy Duwaine SQUIBB, DO  [facility], [address]

## 2024-01-19 NOTE — Progress Notes (Unsigned)
 Electrophysiology Clinic Note    Date:  01/20/2024  Patient ID:  Leroy Merritt, Leroy Merritt 1945/07/16, MRN [id number] PCP:  Vicci Duwaine SQUIBB, DO  Cardiologist:  Evalene Lunger, MD   Electrophysiologist:  Dr. Fernande > Fonda Kitty, MD Electrophysiology APP:  Demorris Choyce, NP    Discussed the use of AI scribe software for clinical note transcription with the patient, who gave verbal consent to proceed.   Patient Profile    Chief Complaint: dizziness  History of Present Illness: Leroy Merritt is a 78 y.o. 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   I saw him 09/2023 for acute visit for dizziness, LH with position changes, increased fatigue and palps. He was having significantly elevated PVC burden on EKG, recommended updated TTE which showed stable mid-range LVEF.  I again saw him 12/2023 where he continued to have fatigue w decreased exercise tolerance. Recommended stopping sotalol  and initiating amiodarone.   On follow-up today, he has had improvement in  his fatigue since transitioning to amiodarone. He has also noticed significantly less PVCs when palpating his pulse. Unforatuently, he had noticed a slight tremor in L hand. The tremor does not impact his daily activities. He continues to take 200mg  amiodarone daily.  He continues to take eplerenone  and entresto .  BP readings at home are fairly consistent with clinic readings today.   He denies chest pain, chest pressure, increased edema. No syncope or palpitations.    His wife joins for visit whom I have met before.    Arrhythmia/Device History St. Jude dual chamber ICD , imp 04/2017; dx NICM, VT   AAD History: Sotalol  - stopped 12/2023 Amiodarone - started 12/2023     ROS:  Please see the history of present illness. All other systems are reviewed and otherwise negative.    Physical Exam    VS:  BP 120/70 (BP  Location: Left Arm, Patient Position: Sitting, Cuff Size: Normal)   Pulse 79   Ht 5' 10 (1.778 m)   Wt 180 lb 6.4 oz (81.8 kg)   SpO2 97%   BMI 25.88 kg/m  BMI: Body mass index is 25.88 kg/m.       Wt Readings from Last 3 Encounters:  01/20/24 180 lb 6.4 oz (81.8 kg)  12/16/23 177 lb 6.4 oz (80.5 kg)  09/29/23 180 lb 6.4 oz (81.8 kg)     GEN- The patient is well appearing, alert and oriented x 3 today.   Lungs- Clear to ausculation bilaterally, normal work of breathing.  Heart- Regular rate and rhythm, no murmurs, rubs or gallops Extremities- No peripheral edema, warm, dry Skin-  device pocket well-healed, no tethering   Brief check performed without iterative lead testing/measurements No ventricular arrhythmias Rare PVC during interrogation VP 6.3%    Studies Reviewed   Previous EP, cardiology notes.    EKG is ordered. Personal review of EKG from today shows:    EKG Interpretation Date/Time:  Wednesday January 20 2024 10:13:18 EST Ventricular Rate:  79 PR Interval:  256 QRS Duration:  86 QT Interval:  372 QTC Calculation: 426 R Axis:   -61  Text Interpretation: Sinus rhythm with 1st degree A-V block Left axis deviation When compared with ECG of 16-Dec-2023 11:01, Premature ventricular complexes are no longer Present Confirmed by Leroy Merritt [phone]) on 01/20/2024 10:14:44 AM    TTE, 12/15/2023  1. Left ventricular ejection fraction,  by estimation, is 45 to 50%. The left ventricle has mildly decreased function. Left ventricular endocardial border not optimally defined to evaluate regional wall motion. Left ventricular diastolic parameters are indeterminate. Beat-to-beat variability with frequent PVCs.   2. Right ventricular systolic function is low normal. The right ventricular size is normal. Tricuspid regurgitation signal is inadequate for assessing PA pressure.   Comparison(s): A prior study was performed on 09/30/22. No significant change from prior study.    TTE, 09/30/2022  1. Left ventricular ejection fraction, by estimation, is 45 to 50%. Left ventricular ejection fraction by PLAX is 47 %. The left ventricle has mildly decreased function. The left ventricle demonstrates global hypokinesis. Left ventricular diastolic parameters are consistent with Grade I diastolic dysfunction (impaired relaxation). The average left ventricular global longitudinal strain is -16.7 %.   2. Right ventricular systolic function is normal. The right ventricular size is normal. Tricuspid regurgitation signal is inadequate for assessing PA pressure.   3. The mitral valve is normal in structure. Mild mitral valve regurgitation. No evidence of mitral stenosis.   4. The aortic valve has an indeterminant number of cusps. Aortic valve regurgitation is not visualized. No aortic stenosis is present.   5. The inferior vena cava is normal in size with greater than 50% respiratory variability, suggesting right atrial pressure of 3 mmHg.   TTE, 03/13/2020  1. Left ventricular ejection fraction, by estimation, is 45 to 50%. The left ventricle has mildly decreased function. The left ventricle demonstrates global hypokinesis. There is mild left ventricular hypertrophy. Left ventricular diastolic parameters are consistent with Grade I diastolic dysfunction (impaired relaxation). The average left ventricular global longitudinal strain is -12.7 %. The global longitudinal strain is abnormal.   2. Right ventricular systolic function is normal. The right ventricular size is normal. There is normal pulmonary artery systolic pressure.   3. The mitral valve is normal in structure. Mild mitral valve regurgitation. No evidence of mitral stenosis.   4. The aortic valve has an indeterminant number of cusps. There is mild thickening of the aortic valve. Aortic valve regurgitation is not visualized. Mild aortic valve sclerosis is present, with no evidence of aortic valve stenosis.   5. Aortic dilatation  noted. 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    TTE, 03/22/2019 1. Left ventricular ejection fraction, by visual estimation, is 50 to 55%. The left ventricle has low normal function. There is no left ventricular hypertrophy.   2. 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   3. Left ventricular diastolic parameters are indeterminate.   4. The left ventricle has no regional wall motion abnormalities.   5. Global right ventricle has mildly reduced systolic function. The right ventricular size is normal. No increase in right ventricular wall thickness.   6. Right ventricular systolic pressure is normal (20-25 mmHg plus central venous pressure).   7. Left atrial size was normal.   8. Right atrial size was normal.   9. The pericardium was not well visualized.  10. The mitral valve is grossly normal. Mild to moderate mitral valve regurgitation.  11. The tricuspid valve is not well visualized.  12. The aortic valve is tricuspid. Aortic valve regurgitation is not visualized. No evidence of aortic valve sclerosis or stenosis.  13. The pulmonic valve was not well visualized. Pulmonic valve regurgitation is trivial.  14. Aortic dilatation noted.  15. There is mild dilatation of the aortic root  measuring 39 mm.  16. A pacer wire is visualized in the RV.  17. The interatrial septum was not well visualized.    TTE, 04/29/2017 - Left ventricle: The cavity size was normal. Wall thickness was normal. Systolic function was moderately to severely reduced. The estimated ejection fraction was in the range of 30% to 35%. Diffuse hypokinesis. Doppler parameters are consistent with abnormal left ventricular relaxation (grade 1 diastolic dysfunction). - Ventricular septum: Septal motion showed abnormal function and dyssynergy. - Aortic valve: Trileaflet; mildly thickened, mildly calcified leaflets.   Assessment and  Plan    #) PVC #) amiodarone monitoring #) HFmrEF Significantly reduced PVC burden via EKG and rhythm strip today Improvement in exercise tolerance with reduced fatigue on amiodarone He is having slight tremor, and requests to reduce amiodarone to 100mg  which I think is reasonable Encouraged him to continue to monitor rhythm by pulse and notify office if PVC burden increases with reduced amiodarone dose Update LFTs, thyroid  labs today   #) VT #) ICD in situ No ventricular arrhythmias on ICD VP 6.3%      Current medicines are reviewed at length with the patient today.   The patient does not have concerns regarding his medicines.  The following changes were made today:  none  Labs/ tests ordered today include:  Orders Placed This Encounter  Procedures   TSH   T4, free   Hepatic function panel   EKG 12-Lead     Disposition: Follow up with Dr. Kennyth or EP APP in 3 months   Signed, Chantal Needle, NP  01/20/24  10:50 AM  Electrophysiology [facility]

## 2024-01-19 NOTE — Telephone Encounter (Signed)
 Requested medications are due for refill today.  yes  Requested medications are on the active medications list.  yes  Last refill. 01/13/2023 #90 3 rf  Future visit scheduled.   yes  Notes to clinic.  Expired labs.    Requested Prescriptions  Pending Prescriptions Disp Refills   tamsulosin  (FLOMAX ) 0.4 MG CAPS capsule [Pharmacy Med Name: TAMSULOSIN  HYDROCHLORIDE 0.4 MG Oral Capsule] 90 capsule 3    Sig: TAKE 1 CAPSULE EVERY DAY     Urology: Alpha-Adrenergic Blocker Failed - 01/19/2024  4:55 PM      Failed - PSA in normal range and within 360 days    Prostate Specific Ag, Serum  Date Value Ref Range Status  01/13/2023 1.2 0.0 - 4.0 ng/mL Final    Comment:    Roche ECLIA methodology. According to the American Urological Association, Serum PSA should decrease and remain at undetectable levels after radical prostatectomy. The AUA defines biochemical recurrence as an initial PSA value 0.2 ng/mL or greater followed by a subsequent confirmatory PSA value 0.2 ng/mL or greater. Values obtained with different assay methods or kits cannot be used interchangeably. Results cannot be interpreted as absolute evidence of the presence or absence of malignant disease.          Failed - Valid encounter within last 12 months    Recent Outpatient Visits   None     Future Appointments             Tomorrow Riddle, Suzann, NP [facility] [place]   In 2 days Vicci Duwaine SQUIBB, DO [facility], [address]            Passed - Last BP in normal range    BP Readings from Last 1 Encounters:  12/16/23 118/60          finasteride  (PROSCAR ) 5 MG tablet [Pharmacy Med Name: FINASTERIDE  5 MG Oral Tablet] 90 tablet 3    Sig: TAKE 1 TABLET AT BEDTIME     Urology: 5-alpha Reductase Inhibitors Failed - 01/19/2024  4:55 PM      Failed - PSA in normal range and within 360 days    Prostate Specific Ag, Serum  Date Value Ref Range Status  01/13/2023 1.2  0.0 - 4.0 ng/mL Final    Comment:    Roche ECLIA methodology. According to the American Urological Association, Serum PSA should decrease and remain at undetectable levels after radical prostatectomy. The AUA defines biochemical recurrence as an initial PSA value 0.2 ng/mL or greater followed by a subsequent confirmatory PSA value 0.2 ng/mL or greater. Values obtained with different assay methods or kits cannot be used interchangeably. Results cannot be interpreted as absolute evidence of the presence or absence of malignant disease.          Failed - Valid encounter within last 12 months    Recent Outpatient Visits   None     Future Appointments             Tomorrow Riddle, Suzann, NP [facility] [place]   In 2 days Vicci Duwaine SQUIBB, DO [facility], [address]            Signed Prescriptions Disp Refills   omeprazole  (PRILOSEC) 20 MG capsule 90 capsule 0    Sig: TAKE 1 CAPSULE EVERY MORNING     Gastroenterology: Proton Pump Inhibitors Failed - 01/19/2024  4:55 PM      Failed -  Valid encounter within last 12 months    Recent Outpatient Visits   None     Future Appointments             Tomorrow Riddle, Suzann, NP [name] [place]   In 2 days Vicci Duwaine SQUIBB, DO [facility], [address]

## 2024-01-20 ENCOUNTER — Ambulatory Visit: Payer: Self-pay | Admitting: Cardiology

## 2024-01-20 ENCOUNTER — Other Ambulatory Visit
Admission: RE | Admit: 2024-01-20 | Discharge: 2024-01-20 | Disposition: A | Discharge status: Home / Self Care | Attending: Cardiology | Admitting: Cardiology

## 2024-01-20 ENCOUNTER — Ambulatory Visit: Attending: Cardiology | Admitting: Cardiology

## 2024-01-20 ENCOUNTER — Encounter: Payer: Self-pay | Admitting: Cardiology

## 2024-01-20 VITALS — BP 120/70 | HR 79 | Ht 70.0 in | Wt 180.4 lb

## 2024-01-20 DIAGNOSIS — Z95810 Presence of automatic (implantable) cardiac defibrillator: Secondary | ICD-10-CM

## 2024-01-20 DIAGNOSIS — Z5181 Encounter for therapeutic drug level monitoring: Secondary | ICD-10-CM

## 2024-01-20 DIAGNOSIS — Z79899 Other long term (current) drug therapy: Secondary | ICD-10-CM

## 2024-01-20 DIAGNOSIS — I493 Ventricular premature depolarization: Secondary | ICD-10-CM

## 2024-01-20 DIAGNOSIS — I472 Ventricular tachycardia, unspecified: Secondary | ICD-10-CM | POA: Diagnosis not present

## 2024-01-20 LAB — HEPATIC FUNCTION PANEL
ALT: 12 U/L (ref 0–44)
AST: 19 U/L (ref 15–41)
Albumin: 4.1 g/dL (ref 3.5–5.0)
Alkaline Phosphatase: 74 U/L (ref 38–126)
Bilirubin, Direct: 0.2 mg/dL (ref 0.0–0.2)
Indirect Bilirubin: 0.2 mg/dL — ABNORMAL LOW (ref 0.3–0.9)
Total Bilirubin: 0.4 mg/dL (ref 0.0–1.2)
Total Protein: 6.7 g/dL (ref 6.5–8.1)

## 2024-01-20 LAB — TSH: TSH: 4.35 u[IU]/mL (ref 0.350–4.500)

## 2024-01-20 LAB — T4, FREE: Free T4: 1.11 ng/dL (ref 0.61–1.12)

## 2024-01-20 MED ORDER — AMIODARONE HCL 100 MG PO TABS: 100.0000 mg | ORAL_TABLET | Freq: Every day | ORAL | Status: AC

## 2024-01-20 NOTE — Patient Instructions (Signed)
 Medication Instructions:   Decrease Amiodarone to 100 MG daily.   *If you need a refill on your cardiac medications before your next appointment, please call your pharmacy*  Lab Work: Your provider would like for you to have following labs drawn today TSH, T4 and LFTS.   If you have labs (blood work) drawn today and your tests are completely normal, you will receive your results only by: MyChart Message (if you have MyChart) OR A paper copy in the mail If you have any lab test that is abnormal or we need to change your treatment, we will call you to review the results.  Testing/Procedures: No test ordered today   Follow-Up: At [facility], you and your health needs are our priority.  As part of our continuing mission to provide you with exceptional heart care, our providers are all part of one team.  This team includes your primary Cardiologist (physician) and Advanced Practice Providers or APPs (Physician Assistants and Nurse Practitioners) who all work together to provide you with the care you need, when you need it.  Your next appointment:   3-4 month(s)  Provider:   Suzann Riddle, NP or Dr. Kennyth    We recommend signing up for the patient portal called MyChart.  Sign up information is provided on this After Visit Summary.  MyChart is used to connect with patients for Virtual Visits (Telemedicine).  Patients are able to view lab/test results, encounter notes, upcoming appointments, etc.  Non-urgent messages can be sent to your provider as well.   To learn more about what you can do with MyChart, go to [email or web address].

## 2024-01-21 ENCOUNTER — Encounter: Payer: Self-pay | Admitting: Family Medicine

## 2024-01-21 ENCOUNTER — Ambulatory Visit (INDEPENDENT_AMBULATORY_CARE_PROVIDER_SITE_OTHER): Admitting: Family Medicine

## 2024-01-21 VITALS — BP 135/78 | HR 69 | Ht 70.0 in | Wt 181.0 lb

## 2024-01-21 DIAGNOSIS — K2100 Gastro-esophageal reflux disease with esophagitis, without bleeding: Secondary | ICD-10-CM | POA: Diagnosis not present

## 2024-01-21 DIAGNOSIS — Z0000 Encounter for general adult medical examination without abnormal findings: Secondary | ICD-10-CM | POA: Diagnosis not present

## 2024-01-21 DIAGNOSIS — N401 Enlarged prostate with lower urinary tract symptoms: Secondary | ICD-10-CM | POA: Diagnosis not present

## 2024-01-21 DIAGNOSIS — I5022 Chronic systolic (congestive) heart failure: Secondary | ICD-10-CM

## 2024-01-21 DIAGNOSIS — Z95811 Presence of heart assist device: Secondary | ICD-10-CM

## 2024-01-21 DIAGNOSIS — R350 Frequency of micturition: Secondary | ICD-10-CM

## 2024-01-21 LAB — MICROALBUMIN, URINE WAIVED
Creatinine, Urine Waived: 50 mg/dL (ref 10–300)
Microalb, Ur Waived: 10 mg/L (ref 0–19)

## 2024-01-21 MED ORDER — OMEPRAZOLE 20 MG PO CPDR: 20.0000 mg | DELAYED_RELEASE_CAPSULE | Freq: Every morning | ORAL | 3 refills | Status: AC

## 2024-01-21 MED ORDER — FINASTERIDE 5 MG PO TABS: 5.0000 mg | ORAL_TABLET | Freq: Every day | ORAL | 3 refills | Status: AC

## 2024-01-21 MED ORDER — TAMSULOSIN HCL 0.4 MG PO CAPS: 0.4000 mg | ORAL_CAPSULE | Freq: Every day | ORAL | 3 refills | Status: AC

## 2024-01-21 NOTE — Progress Notes (Signed)
 Chief Complaint  Patient presents with   Medicare Wellness     Subjective:   Leroy Merritt is a 78 y.o. male who presents for a Medicare Annual Wellness Visit.  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 b.) TTE 03/13/2020: Ao root 39mm; Asc. 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   Cataract    Dilated cardiomyopathy (HCC)    a.) LHC 04/28/2017: EF 35-35%. b.) TTE 04/29/2017: 30-35%. c.) TTE 03/22/2019: 50-55%. d.) TTE 03/13/2020: EF45-50%.   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 b.) Cardiac MRI 04/29/2017: biventricular/biatrial enlargement; LVEF 27%; global HK. c.) TTE 03/13/2020: EF 45-50%; global HK; mild LVH; GLS -12.7%; AoV sclerosis without stenosis; mild MR, triv TR; G1DD.   Hypercholesterolemia    ICD (implantable cardioverter-defibrillator) in place    a.) St. Judes device   NICM (nonischemic cardiomyopathy) (HCC)    a.) TTE 04/29/2017: EF 30-35%. b.) Cardiac MRI 04/29/2017: EF 27%.   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 Jude dual-chamber ICD placement   Past Surgical History:   Procedure Laterality Date   CARDIAC CATHETERIZATION  04/28/2017   CATARACT EXTRACTION W/ INTRAOCULAR LENS IMPLANT Right 2017   CATARACT EXTRACTION W/PHACO Left 05/20/2019   Procedure: CATARACT EXTRACTION PHACO AND INTRAOCULAR LENS PLACEMENT (IOC) LEFT;  Surgeon: Jaye Fallow, MD;  Location: [facility];  Service: Ophthalmology;  Laterality: Left;  US  01:19.7 CDE 14.58 Fluid Pack Lot # [id number]   COLONOSCOPY     ESOPHAGOGASTRODUODENOSCOPY (EGD) WITH PROPOFOL  N/A 02/19/2015   Procedure: ESOPHAGOGASTRODUODENOSCOPY (EGD) WITH PROPOFOL  and esophageal dilation.;  Surgeon: Rogelia Copping, MD;  Location: [facility];  Service: Endoscopy;  Laterality: N/A;   EYE SURGERY     cataract   ICD IMPLANT N/A 04/29/2017   Procedure: ICD IMPLANT;  Surgeon: Waddell Danelle LELON, MD;  Location: [facility] INVASIVE CV LAB;  Service: Cardiovascular;  Laterality: N/A;   JOINT REPLACEMENT  2022, 2023   KNEE ARTHROPLASTY Left 08/12/2021   Procedure: COMPUTER ASSISTED TOTAL KNEE ARTHROPLASTY;  Surgeon: Mardee Lynwood SQUIBB, MD;  Location: [facility];  Service: Orthopedics;  Laterality: Left;   LEFT HEART CATH AND CORONARY ANGIOGRAPHY N/A 04/28/2017   Procedure: LEFT HEART CATH AND CORONARY ANGIOGRAPHY;  Surgeon: Jordan, Peter M, MD;  Location: [facility] INVASIVE CV LAB;  Service: Cardiovascular;  Laterality: N/A;   PROSTATE BIOPSY  ~ 2012   RETINAL DETACHMENT SURGERY Right 1990s   torn retina   TOTAL HIP ARTHROPLASTY Right 03/20/2021   Procedure: TOTAL HIP ARTHROPLASTY;  Surgeon: Mardee Lynwood SQUIBB, MD;  Location: [facility];  Service: Orthopedics;  Laterality: Right;   Family History  Problem Relation Age of Onset   Arthritis Mother    Diabetes Father    Hypertension Father    Heart disease Brother    Hyperlipidemia Brother    Hypertension Brother    Lung cancer Daughter    Cancer Daughter    Cancer Daughter    Social History   Occupational History   Not on file  Tobacco Use   Smoking status: Never    Passive exposure:  Never   Smokeless tobacco: Never  Vaping Use   Vaping status: Never Used  Substance and Sexual Activity   Alcohol use: Yes    Alcohol/week: 1.0 standard drink of alcohol    Types: 1 Glasses of wine per week    Comment: maybe once per month   Drug use: No   Sexual activity: Not Currently    Birth control/protection: None    Comment: NA   Tobacco Counseling Counseling given: Not Answered  SDOH Screenings   Food Insecurity: No Food Insecurity (01/18/2024)  Housing: Low Risk  (01/18/2024)  Transportation Needs: No Transportation Needs (01/18/2024)  Utilities: Not At Risk (01/21/2024)  Alcohol Screen: Low Risk  (01/18/2024)  Depression (PHQ2-9): Low Risk  (01/21/2024)  Financial Resource Strain: Low Risk  (01/18/2024)  Physical Activity: Insufficiently Active (01/18/2024)  Social Connections: Socially Integrated (01/18/2024)  Stress: No Stress Concern Present (01/18/2024)  Tobacco Use: Low Risk  (01/21/2024)  Health Literacy: Adequate Health Literacy (01/21/2024)   See flowsheets for full screening details  Depression Screen PHQ 2 & 9 Depression Scale- Over the past 2 weeks, how often have you been bothered by any of the following problems? Little interest or pleasure in doing things: 0 Feeling down, depressed, or hopeless (PHQ Adolescent also includes...irritable): 0 PHQ-2 Total Score: 0 Trouble falling or staying asleep, or sleeping too much: 0 Feeling tired or having little energy: 0 Poor appetite or overeating (PHQ Adolescent also includes...weight loss): 0 Feeling bad about yourself - or that you are a failure or have let yourself or your family down: 0 Trouble concentrating on things, such as reading the newspaper or watching television (PHQ Adolescent also includes...like school work): 0 Moving or speaking so slowly that other people could have noticed. Or the opposite - being so fidgety or restless that you have been moving around a lot more than usual: 0 Thoughts that  you would be better off dead, or of hurting yourself in some way: 0 PHQ-9 Total Score: 0 If you checked off any problems, how difficult have these problems made it for you to do your work, take care of things at home, or get along with other people?: Not difficult at all     Goals Addressed   None    Visit info / Clinical Intake: Medicare Wellness Visit Type:: Subsequent Annual Wellness Visit Persons participating in visit:: patient Medicare Wellness Visit Mode:: In-person (required for WTM) Information given by:: patient Interpreter Needed?: No Pre-visit prep was completed: no AWV questionnaire completed by patient prior to visit?: no Living arrangements:: lives with spouse/significant other Patient's Overall Health Status Rating: very good Typical amount of pain: none Does pain affect daily life?: no Are you currently prescribed opioids?: no  Dietary Habits and Nutritional Risks How many meals a day?: 3 Eats fruit and vegetables daily?: yes Most meals are obtained by: preparing own meals In the last 2 weeks, have you had any of the following?: none Diabetic:: no  Functional Status Activities of Daily Living (to include ambulation/medication): Independent Ambulation: Independent Medication  Administration: Independent Home Management: Independent Manage your own finances?: yes Primary transportation is: driving Concerns about vision?: no *vision screening is required for WTM* Concerns about hearing?: no  Fall Screening Falls in the past year?: 0 Number of falls in past year: 0 Was there an injury with Fall?: 0 Fall Risk Category Calculator: 0 Patient Fall Risk Level: Low Fall Risk  Fall Risk Patient at Risk for Falls Due to: No Fall Risks Fall risk Follow up: Falls evaluation completed  Home and Transportation Safety: All rugs have non-skid backing?: yes All stairs or steps have railings?: yes Grab bars in the bathtub or shower?: (!) no Have non-skid surface in  bathtub or shower?: yes Good home lighting?: yes Regular seat belt use?: yes Hospital stays in the last year:: no  Cognitive Assessment Difficulty concentrating, remembering, or making decisions? : no Will 6CIT or Mini Cog be Completed: no 6CIT or Mini Cog Declined: patient alert, oriented, able to answer questions appropriately and recall recent events What year is it?: 0 points What month is it?: 0 points Give patient an address phrase to remember (5 components): Norleen Sharps, [address], [place] About what time is it?: 0 points Count backwards from 20 to 1: 0 points Say the months of the year in reverse: 0 points Repeat the address phrase from earlier: 4 points 6 CIT Score: 4 points  Advance Directives (For Healthcare) Does Patient Have a Medical Advance Directive?: No Would patient like information on creating a medical advance directive?: No - Patient declined  Reviewed/Updated  Reviewed/Updated: Reviewed All (Medical, Surgical, Family, Medications, Allergies, Care Teams, Patient Goals)        Objective:    Today's Vitals   01/21/24 1044  BP: 135/78  Pulse: 69  Weight: 181 lb (82.1 kg)  Height: 5' 10 (1.778 m)   Body mass index is 25.97 kg/m.  Current Medications (verified) Outpatient Encounter Medications as of 01/21/2024  Medication Sig   amiodarone (PACERONE) 100 MG tablet Take 1 tablet (100 mg total) by mouth daily.   clobetasol  ointment (TEMOVATE ) 0.05 % Apply 1 Application topically 2 (two) times daily.   eplerenone  (INSPRA ) 25 MG tablet Take 1 tablet (25 mg total) by mouth daily.   sacubitril -valsartan  (ENTRESTO ) 49-51 MG TAKE 1 TABLET TWICE DAILY   [DISCONTINUED] finasteride  (PROSCAR ) 5 MG tablet Take 1 tablet (5 mg total) by mouth at bedtime.   [DISCONTINUED] omeprazole  (PRILOSEC) 20 MG capsule TAKE 1 CAPSULE EVERY MORNING   [DISCONTINUED] tamsulosin  (FLOMAX ) 0.4 MG CAPS capsule Take 1 capsule (0.4 mg total) by mouth daily.   finasteride  (PROSCAR ) 5  MG tablet Take 1 tablet (5 mg total) by mouth at bedtime.   omeprazole  (PRILOSEC) 20 MG capsule Take 1 capsule (20 mg total) by mouth every morning.   tamsulosin  (FLOMAX ) 0.4 MG CAPS capsule Take 1 capsule (0.4 mg total) by mouth daily.   No facility-administered encounter medications on file as of 01/21/2024.   Hearing/Vision screen No results found. Immunizations and Health Maintenance Health Maintenance  Topic Date Due   Zoster Vaccines- Shingrix (1 of 2) 04/22/2024 (Originally 08/03/1964)   Influenza Vaccine  06/07/2024 (Originally 10/09/2023)   DTaP/Tdap/Td (2 - Td or Tdap) 01/20/2025 (Originally 08/24/2021)   Medicare Annual Wellness (AWV)  01/20/2025   Pneumococcal Vaccine: 50+ Years  Completed   Meningococcal B Vaccine  Aged Out   Colonoscopy  Discontinued   COVID-19 Vaccine  Discontinued   Hepatitis C Screening  Discontinued  Assessment/Plan:  This is a routine wellness examination for [facility].  Patient Care Team: Vicci Duwaine SQUIBB, DO as PCP - General (Family Medicine) Perla Evalene PARAS, MD as PCP - Cardiology (Cardiology) Kennyth Chew, MD as PCP - Electrophysiology (Cardiology) Perla Evalene PARAS, MD as Consulting Physician (Cardiology) Fernande Elspeth BROCKS, MD (Inactive) as Consulting Physician (Cardiology) Riddle, Suzann, NP as Nurse Practitioner (Clinical Cardiac Electrophysiology)  I have personally reviewed and noted the following in the patient's chart:   Medical and social history Use of alcohol, tobacco or illicit drugs  Current medications and supplements including opioid prescriptions. Functional ability and status Nutritional status Physical activity Advanced directives List of other physicians Hospitalizations, surgeries, and ER visits in previous 12 months Vitals Screenings to include cognitive, depression, and falls Referrals and appointments  Orders Placed This Encounter  Procedures   CBC with Differential/Platelet   Lipid Panel w/o Chol/HDL  Ratio    Has the patient fasted?:   Yes   Microalbumin, Urine Waived   PSA   Basic metabolic panel with GFR    Has the patient fasted?:   Yes   In addition, I have reviewed and discussed with patient certain preventive protocols, quality metrics, and best practice recommendations. A written personalized care plan for preventive services as well as general preventive health recommendations were provided to patient.   Duwaine Vicci, DO   01/21/2024   Return in about 1 year (around 01/20/2025) for physical.  After Visit Summary: (In Person-Printed) AVS printed and given to the patient

## 2024-01-21 NOTE — Assessment & Plan Note (Signed)
Stable. Continue to follow with cardiology. Call with any concerns.  

## 2024-01-21 NOTE — Progress Notes (Signed)
 BP 135/78   Pulse 69   Ht 5' 10 (1.778 m)   Wt 181 lb (82.1 kg)   BMI 25.97 kg/m    Subjective:    Patient ID: Leroy Merritt, male    DOB: [birth year], 78 y.o.   MRN: [id number]  HPI: Leroy Merritt is a 78 y.o. male presenting on 01/21/2024 for comprehensive medical examination. Current medical complaints include:  BPH BPH status: controlled Satisfied with current treatment?: yes Medication side effects: no Medication compliance: excellent compliance Duration: chronic Nocturia: 1-2x per night Urinary frequency:no Incomplete voiding: no Urgency: no Weak urinary stream: no Straining to start stream: no Dysuria: no Onset: gradual Severity: mild  CHF has been doing well. Has been following with cardiology. No concerns.   Interim Problems from his last visit: none  Functional Status Survey: Is the patient deaf or have difficulty hearing?: No Does the patient have difficulty seeing, even when wearing glasses/contacts?: No Does the patient have difficulty concentrating, remembering, or making decisions?: No Does the patient have difficulty walking or climbing stairs?: No Does the patient have difficulty dressing or bathing?: No Does the patient have difficulty doing errands alone such as visiting a doctor's office or shopping?: No  FALL RISK:    01/21/2024   10:46 AM 01/13/2023   10:18 AM 12/23/2021   10:24 AM 12/19/2021    9:34 AM 12/18/2020    1:19 PM  Fall Risk   Falls in the past year? 0 0 0 0 0  Number falls in past yr: 0 0 0 0   Injury with Fall? 0 0 0 0   Risk for fall due to : No Fall Risks  No Fall Risks    Follow up Falls evaluation completed Falls evaluation completed Falls evaluation completed  Falls evaluation completed;Education provided;Falls prevention discussed       Data saved with a previous flowsheet row definition    Depression Screen    01/21/2024   10:55 AM 01/13/2023   10:18 AM 12/23/2021   10:24 AM 12/19/2021    9:41 AM 12/18/2020     1:19 PM  Depression screen PHQ 2/9  Decreased Interest 0 0 0 0 0  Down, Depressed, Hopeless 0 0 0 0 0  PHQ - 2 Score 0 0 0 0 0  Altered sleeping 0  0 0 0  Tired, decreased energy 0  0 1 0  Change in appetite 0  0 0 0  Feeling bad or failure about yourself  0  0 0   Trouble concentrating 0  0 0 0  Moving slowly or fidgety/restless 0  0 0 0  Suicidal thoughts 0  0 0 0  PHQ-9 Score 0  0  1  0   Difficult doing work/chores Not difficult at all  Not difficult at all Not difficult at all      Data saved with a previous flowsheet row definition    Advanced Directives Does patient have a HCPOA?    no If yes, name and contact information:  Does patient have a living will or MOST form?  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 b.) TTE 03/13/2020: Ao root 39mm; Asc. 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   Cataract    Dilated cardiomyopathy (HCC)    a.) LHC 04/28/2017: EF 35-35%. b.) TTE 04/29/2017: 30-35%. c.) TTE 03/22/2019: 50-55%. d.) TTE 03/13/2020: EF45-50%.   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 b.) Cardiac MRI 04/29/2017: biventricular/biatrial enlargement; LVEF 27%; global HK. c.) TTE 03/13/2020: EF 45-50%; global HK; mild LVH; GLS -12.7%; AoV sclerosis without stenosis; mild MR, triv TR; G1DD.   Hypercholesterolemia    ICD (implantable cardioverter-defibrillator) in place    a.) St. Judes device    NICM (nonischemic cardiomyopathy) (HCC)    a.) TTE 04/29/2017: EF 30-35%. b.) Cardiac MRI 04/29/2017: EF 27%.   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 Jude dual-chamber ICD placement    Surgical History:  Past Surgical History:  Procedure Laterality Date   CARDIAC CATHETERIZATION  04/28/2017   CATARACT EXTRACTION W/ INTRAOCULAR LENS IMPLANT Right 2017   CATARACT EXTRACTION W/PHACO Left 05/20/2019   Procedure: CATARACT EXTRACTION PHACO AND INTRAOCULAR LENS PLACEMENT (IOC) LEFT;  Surgeon: Jaye Fallow, MD;  Location: [facility];  Service: Ophthalmology;  Laterality: Left;  US  01:19.7 CDE 14.58 Fluid Pack Lot # [id number]   COLONOSCOPY     ESOPHAGOGASTRODUODENOSCOPY (EGD) WITH PROPOFOL  N/A 02/19/2015   Procedure: ESOPHAGOGASTRODUODENOSCOPY (EGD) WITH PROPOFOL  and esophageal dilation.;  Surgeon: Rogelia Copping, MD;  Location: [facility];  Service: Endoscopy;  Laterality: N/A;   EYE SURGERY     cataract   ICD IMPLANT N/A 04/29/2017   Procedure: ICD IMPLANT;  Surgeon: Waddell Danelle ORN, MD;  Location: [facility] INVASIVE CV LAB;  Service: Cardiovascular;  Laterality: N/A;   JOINT REPLACEMENT  2022, 2023   KNEE ARTHROPLASTY Left 08/12/2021   Procedure: COMPUTER ASSISTED TOTAL KNEE ARTHROPLASTY;  Surgeon: Mardee Lynwood SQUIBB, MD;  Location: [facility];  Service: Orthopedics;  Laterality: Left;   LEFT HEART CATH AND CORONARY ANGIOGRAPHY N/A 04/28/2017   Procedure: LEFT HEART CATH AND CORONARY ANGIOGRAPHY;  Surgeon: Jordan, Peter M, MD;  Location: [facility] INVASIVE CV LAB;  Service: Cardiovascular;  Laterality: N/A;   PROSTATE BIOPSY  ~ 2012   RETINAL DETACHMENT SURGERY Right 1990s   torn retina   TOTAL HIP  ARTHROPLASTY Right 03/20/2021   Procedure: TOTAL HIP ARTHROPLASTY;  Surgeon: Mardee Lynwood SQUIBB, MD;  Location: [facility];  Service: Orthopedics;  Laterality: Right;    Medications:  Current Outpatient Medications on File Prior to Visit  Medication Sig   amiodarone (PACERONE) 100 MG tablet Take 1 tablet (100 mg total) by mouth daily.   clobetasol  ointment (TEMOVATE ) 0.05 % Apply 1 Application topically 2 (two) times daily.   eplerenone  (INSPRA ) 25 MG tablet Take 1 tablet (25 mg total) by mouth daily.   sacubitril -valsartan  (ENTRESTO ) 49-51 MG TAKE 1 TABLET TWICE DAILY   No current facility-administered medications on file prior to visit.    Allergies:  Allergies  Allergen Reactions   Spironolactone  Other (See Comments)  Gynecomastia     Social History:  Social History   Socioeconomic History   Marital status: Married    Spouse name: [name]   Number of children: Not on file   Years of education: college   Highest education level: Bachelor's degree (e.g., BA, AB, BS)  Occupational History   Not on file  Tobacco Use   Smoking status: Never    Passive exposure: Never   Smokeless tobacco: Never  Vaping Use   Vaping status: Never Used  Substance and Sexual Activity   Alcohol use: Yes    Alcohol/week: 1.0 standard drink of alcohol    Types: 1 Glasses of wine per week    Comment: maybe once per month   Drug use: No   Sexual activity: Not Currently    Birth control/protection: None    Comment: NA  Other Topics Concern   Not on file  Social History Narrative   Not on file   Social Drivers of Health   Financial Resource Strain: Low Risk  (01/18/2024)   Overall Financial Resource Strain (CARDIA)    Difficulty of Paying Living Expenses: Not hard at all  Food Insecurity: No Food Insecurity (01/18/2024)   Hunger Vital Sign    Worried About Running Out of Food in the Last Year: Never true    Ran Out of Food in the Last Year: Never true  Transportation Needs: No  Transportation Needs (01/18/2024)   PRAPARE - Administrator, Civil Service (Medical): No    Lack of Transportation (Non-Medical): No  Physical Activity: Insufficiently Active (01/18/2024)   Exercise Vital Sign    Days of Exercise per Week: 3 days    Minutes of Exercise per Session: 20 min  Stress: No Stress Concern Present (01/18/2024)   [employer] of Occupational Health - Occupational Stress Questionnaire    Feeling of Stress: Not at all  Social Connections: Socially Integrated (01/18/2024)   Social Connection and Isolation Panel    Frequency of Communication with Friends and Family: More than three times a week    Frequency of Social Gatherings with Friends and Family: More than three times a week    Attends Religious Services: More than 4 times per year    Active Member of [employer] or Organizations: Yes    Attends Engineer, Structural: More than 4 times per year    Marital Status: Married  Catering Manager Violence: Not At Risk (01/21/2024)   Humiliation, Afraid, Rape, and Kick questionnaire    Fear of Current or Ex-Partner: No    Emotionally Abused: No    Physically Abused: No    Sexually Abused: No   Social History   Tobacco Use  Smoking Status Never   Passive exposure: Never  Smokeless Tobacco Never   Social History   Substance and Sexual Activity  Alcohol Use Yes   Alcohol/week: 1.0 standard drink of alcohol   Types: 1 Glasses of wine per week   Comment: maybe once per month    Family History:  Family History  Problem Relation Age of Onset   Arthritis Mother    Diabetes Father    Hypertension Father    Heart disease Brother    Hyperlipidemia Brother    Hypertension Brother    Lung cancer Daughter    Cancer Daughter    Cancer Daughter     Past medical history, surgical history, medications, allergies, family history and social history reviewed with patient today and changes made to appropriate areas  of the chart.   Review of  Systems  Constitutional: Negative.   HENT: Negative.    Eyes: Negative.   Respiratory: Negative.    Cardiovascular: Negative.   Gastrointestinal:  Positive for heartburn. Negative for abdominal pain, blood in stool, constipation, diarrhea, melena, nausea and vomiting.  Genitourinary: Negative.   Musculoskeletal: Negative.   Skin: Negative.   Endo/Heme/Allergies: Negative.   Psychiatric/Behavioral: Negative.     All other ROS negative except what is listed above and in the HPI.      Objective:    BP 135/78   Pulse 69   Ht 5' 10 (1.778 m)   Wt 181 lb (82.1 kg)   BMI 25.97 kg/m   Wt Readings from Last 3 Encounters:  01/21/24 181 lb (82.1 kg)  01/20/24 180 lb 6.4 oz (81.8 kg)  12/16/23 177 lb 6.4 oz (80.5 kg)     Physical Exam Vitals and nursing note reviewed.  Constitutional:      General: He is not in acute distress.    Appearance: Normal appearance. He is not ill-appearing, toxic-appearing or diaphoretic.  HENT:     Head: Normocephalic and atraumatic.     Right Ear: Tympanic membrane, ear canal and external ear normal. There is no impacted cerumen.     Left Ear: Tympanic membrane, ear canal and external ear normal. There is no impacted cerumen.     Nose: Nose normal. No congestion or rhinorrhea.     Mouth/Throat:     Mouth: Mucous membranes are moist.     Pharynx: Oropharynx is clear. No oropharyngeal exudate or posterior oropharyngeal erythema.  Eyes:     General: No scleral icterus.       Right eye: No discharge.        Left eye: No discharge.     Extraocular Movements: Extraocular movements intact.     Conjunctiva/sclera: Conjunctivae normal.     Pupils: Pupils are equal, round, and reactive to light.  Neck:     Vascular: No carotid bruit.  Cardiovascular:     Rate and Rhythm: Normal rate and regular rhythm.     Pulses: Normal pulses.     Heart sounds: No murmur heard.    No friction rub. No gallop.  Pulmonary:     Effort: Pulmonary effort is normal. No  respiratory distress.     Breath sounds: Normal breath sounds. No stridor. No wheezing, rhonchi or rales.  Chest:     Chest wall: No tenderness.  Abdominal:     General: Abdomen is flat. Bowel sounds are normal. There is no distension.     Palpations: Abdomen is soft. There is no mass.     Tenderness: There is no abdominal tenderness. There is no right CVA tenderness, left CVA tenderness, guarding or rebound.     Hernia: No hernia is present.  Genitourinary:    Comments: Genital exam deferred with shared decision making Musculoskeletal:        General: No swelling, tenderness, deformity or signs of injury.     Cervical back: Normal range of motion and neck supple. No rigidity. No muscular tenderness.     Right lower leg: No edema.     Left lower leg: No edema.  Lymphadenopathy:     Cervical: No cervical adenopathy.  Skin:    General: Skin is warm and dry.     Capillary Refill: Capillary refill takes less than 2 seconds.     Coloration: Skin is not jaundiced or pale.  Findings: No bruising, erythema, lesion or rash.  Neurological:     General: No focal deficit present.     Mental Status: He is alert and oriented to person, place, and time.     Cranial Nerves: No cranial nerve deficit.     Sensory: No sensory deficit.     Motor: No weakness.     Coordination: Coordination normal.     Gait: Gait normal.     Deep Tendon Reflexes: Reflexes normal.  Psychiatric:        Mood and Affect: Mood normal.        Behavior: Behavior normal.        Thought Content: Thought content normal.        Judgment: Judgment normal.        01/21/2024   11:03 AM 01/13/2023   10:10 AM 12/19/2021    9:35 AM 12/18/2020    1:38 PM 03/16/2017    9:11 AM  6CIT Screen  What Year? 0 points 0 points 0 points 0 points 0 points  What month? 0 points 0 points 0 points 0 points 0 points  What time? 0 points 0 points 0 points 0 points 0 points  Count back from 20 0 points 0 points 0 points 0 points 0 points   Months in reverse 0 points 0 points 0 points 0 points 0 points  Repeat phrase 4 points 4 points 0 points 2 points 0 points  Total Score 4 points 4 points 0 points 2 points 0 points    Results for orders placed or performed during the hospital encounter of 01/20/24  Hepatic function panel   Collection Time: 01/20/24 11:04 AM  Result Value Ref Range   Total Protein 6.7 6.5 - 8.1 g/dL   Albumin 4.1 3.5 - 5.0 g/dL   AST 19 15 - 41 U/L   ALT 12 0 - 44 U/L   Alkaline Phosphatase 74 38 - 126 U/L   Total Bilirubin 0.4 0.0 - 1.2 mg/dL   Bilirubin, Direct 0.2 0.0 - 0.2 mg/dL   Indirect Bilirubin 0.2 (L) 0.3 - 0.9 mg/dL  T4, free   Collection Time: 01/20/24 11:04 AM  Result Value Ref Range   Free T4 1.11 0.61 - 1.12 ng/dL  TSH   Collection Time: 01/20/24 11:04 AM  Result Value Ref Range   TSH 4.350 0.350 - 4.500 uIU/mL      Assessment & Plan:   Problem List Items Addressed This Visit       Cardiovascular and Mediastinum   Chronic heart failure with mildly reduced ejection fraction (HFmrEF, 41-49%) (HCC)   Stable. Continue to follow with cardiology. Call with any concerns.       Relevant Orders   CBC with Differential/Platelet   Lipid Panel w/o Chol/HDL Ratio   Microalbumin, Urine Waived   Basic metabolic panel with GFR     Digestive   Reflux esophagitis   Under good control on current regimen. Continue current regimen. Continue to monitor. Call with any concerns. Refills given. Labs drawn today.       Relevant Medications   omeprazole  (PRILOSEC) 20 MG capsule     Genitourinary   BPH (benign prostatic hyperplasia)   Under good control on current regimen. Continue current regimen. Continue to monitor. Call with any concerns. Refills given. Labs drawn today.        Relevant Medications   finasteride  (PROSCAR ) 5 MG tablet   tamsulosin  (FLOMAX ) 0.4 MG CAPS capsule   Other Relevant Orders  CBC with Differential/Platelet   PSA     Other   Presence of heart assist  device (HCC)   Stable. Continue to follow with cardiology. Call with any concerns.       Other Visit Diagnoses       Encounter for Medicare annual wellness exam    -  Primary   Preventative care discussed today as below.     Routine general medical examination at a health care facility       Vaccines up to date/declined. Screening labs checked today. Continue diet and exercise. Call with any concerns.        Preventative Services:  Health Risk Assessment and Personalized Prevention Plan: Done today Bone Mass Measurements: N/A CVD Screening: Done today Colon Cancer Screening: N/A Depression Screening: Done today Diabetes Screening: Done today Glaucoma Screening: See your eye doctor Hepatitis B vaccine: N/A Hepatitis C screening: Done today HIV Screening: Done today Flu Vaccine: Declined Lung cancer Screening: N/A Obesity Screening: Done today Pneumonia Vaccines: up to date STI Screening: N/A PSA screening: Done today  Discussed aspirin  prophylaxis for myocardial infarction prevention and decision was it was not indicated  LABORATORY TESTING:  Health maintenance labs ordered today as discussed above.   The natural history of prostate cancer and ongoing controversy regarding screening and potential treatment outcomes of prostate cancer has been discussed with the patient. The meaning of a false positive PSA and a false negative PSA has been discussed. He indicates understanding of the limitations of this screening test and wishes to proceed with screening PSA testing.   IMMUNIZATIONS:   - Tdap: Tetanus vaccination status reviewed: Declined. - Influenza: Refused - Pneumovax: Up to date - Prevnar: Up to date - Zostavax vaccine: Refused  SCREENING: - Colonoscopy: Not applicable  Discussed with patient purpose of the colonoscopy is to detect colon cancer at curable precancerous or early stages   PATIENT COUNSELING:    Sexuality: Discussed sexually transmitted diseases,  partner selection, use of condoms, avoidance of unintended pregnancy  and contraceptive alternatives.   Advised to avoid cigarette smoking.  I discussed with the patient that most people either abstain from alcohol or drink within safe limits (<=14/week and <=4 drinks/occasion for males, <=7/weeks and <= 3 drinks/occasion for females) and that the risk for alcohol disorders and other health effects rises proportionally with the number of drinks per week and how often a drinker exceeds daily limits.  Discussed cessation/primary prevention of drug use and availability of treatment for abuse.   Diet: Encouraged to adjust caloric intake to maintain  or achieve ideal body weight, to reduce intake of dietary saturated fat and total fat, to limit sodium intake by avoiding high sodium foods and not adding table salt, and to maintain adequate dietary potassium and calcium preferably from fresh fruits, vegetables, and low-fat dairy products.    stressed the importance of regular exercise  Injury prevention: Discussed safety belts, safety helmets, smoke detector, smoking near bedding or upholstery.   Dental health: Discussed importance of regular tooth brushing, flossing, and dental visits.   Follow up plan: NEXT PREVENTATIVE PHYSICAL DUE IN 1 YEAR. Return in about 1 year (around 01/20/2025) for physical.

## 2024-01-21 NOTE — Patient Instructions (Addendum)
 Preventative Services:  Health Risk Assessment and Personalized Prevention Plan: Done today Bone Mass Measurements: N/A CVD Screening: Done today Colon Cancer Screening: N/A Depression Screening: Done today Diabetes Screening: Done today Glaucoma Screening: See your eye doctor Hepatitis B vaccine: N/A Hepatitis C screening: Done today HIV Screening: Done today Flu Vaccine: Declined Lung cancer Screening: N/A Obesity Screening: Done today Pneumonia Vaccines: up to date STI Screening: N/A PSA screening: Done today  Leroy Merritt,  Thank you for taking the time for your Medicare Wellness Visit. I appreciate your continued commitment to your health goals. Please review the care plan we discussed, and feel free to reach out if I can assist you further.  Please note that Annual Wellness Visits do not include a physical exam. Some assessments may be limited, especially if the visit was conducted virtually. If needed, we may recommend an in-person follow-up with your provider.  Ongoing Care Seeing your primary care provider every 3 to 6 months helps us  monitor your health and provide consistent, personalized care.   Referrals If a referral was made during today's visit and you haven't received any updates within two weeks, please contact the referred provider directly to check on the status.  Recommended Screenings:  Health Maintenance  Topic Date Due   Medicare Annual Wellness Visit  01/13/2024   Zoster (Shingles) Vaccine (1 of 2) 04/22/2024*   Flu Shot  06/07/2024*   DTaP/Tdap/Td vaccine (2 - Td or Tdap) 01/20/2025*   Pneumococcal Vaccine for age over 35  Completed   Meningitis B Vaccine  Aged Out   Colon Cancer Screening  Discontinued   COVID-19 Vaccine  Discontinued   Hepatitis C Screening  Discontinued  *Topic was postponed. The date shown is not the original due date.       01/21/2024   10:46 AM  Advanced Directives  Does Patient Have a Medical Advance Directive? No   Would patient like information on creating a medical advance directive? No - Patient declined    Vision: Annual vision screenings are recommended for early detection of glaucoma, cataracts, and diabetic retinopathy. These exams can also reveal signs of chronic conditions such as diabetes and high blood pressure.  Dental: Annual dental screenings help detect early signs of oral cancer, gum disease, and other conditions linked to overall health, including heart disease and diabetes.  Please see the attached documents for additional preventive care recommendations.

## 2024-01-21 NOTE — Assessment & Plan Note (Signed)
 Under good control on current regimen. Continue current regimen. Continue to monitor. Call with any concerns. Refills given. Labs drawn today.

## 2024-01-22 LAB — BASIC METABOLIC PANEL WITH GFR
BUN/Creatinine Ratio: 12 (ref 10–24)
BUN: 15 mg/dL (ref 8–27)
CO2: 22 mmol/L (ref 20–29)
Calcium: 8.6 mg/dL (ref 8.6–10.2)
Chloride: 103 mmol/L (ref 96–106)
Creatinine, Ser: 1.23 mg/dL (ref 0.76–1.27)
Glucose: 111 mg/dL — ABNORMAL HIGH (ref 70–99)
Potassium: 4.1 mmol/L (ref 3.5–5.2)
Sodium: 139 mmol/L (ref 134–144)
eGFR: 59 mL/min/1.73 — ABNORMAL LOW (ref >59)

## 2024-01-22 LAB — CBC WITH DIFFERENTIAL/PLATELET
Basophils Absolute: 0 x10E3/uL (ref 0.0–0.2)
Basos: 0 %
EOS (ABSOLUTE): 0.1 x10E3/uL (ref 0.0–0.4)
Eos: 1 %
Hematocrit: 47 % (ref 37.5–51.0)
Hemoglobin: 15.7 g/dL (ref 13.0–17.7)
Immature Grans (Abs): 0 x10E3/uL (ref 0.0–0.1)
Immature Granulocytes: 0 %
Lymphocytes Absolute: 1.2 x10E3/uL (ref 0.7–3.1)
Lymphs: 28 %
MCH: 30.4 pg (ref 26.6–33.0)
MCHC: 33.4 g/dL (ref 31.5–35.7)
MCV: 91 fL (ref 79–97)
Monocytes Absolute: 0.4 x10E3/uL (ref 0.1–0.9)
Monocytes: 9 %
Neutrophils Absolute: 2.6 x10E3/uL (ref 1.4–7.0)
Neutrophils: 61 %
Platelets: 119 x10E3/uL — ABNORMAL LOW (ref 150–450)
RBC: 5.16 x10E6/uL (ref 4.14–5.80)
RDW: 13.4 % (ref 11.6–15.4)
WBC: 4.3 x10E3/uL (ref 3.4–10.8)

## 2024-01-22 LAB — LIPID PANEL W/O CHOL/HDL RATIO
Cholesterol, Total: 150 mg/dL (ref 100–199)
HDL: 51 mg/dL (ref >39)
LDL Chol Calc (NIH): 85 mg/dL (ref 0–99)
Triglycerides: 71 mg/dL (ref 0–149)
VLDL Cholesterol Cal: 14 mg/dL (ref 5–40)

## 2024-01-22 LAB — PSA: Prostate Specific Ag, Serum: 1.4 ng/mL (ref 0.0–4.0)

## 2024-02-02 ENCOUNTER — Ambulatory Visit: Payer: Self-pay | Admitting: Family Medicine

## 2024-02-02 ENCOUNTER — Encounter: Payer: Self-pay | Admitting: Family Medicine

## 2024-02-19 ENCOUNTER — Ambulatory Visit

## 2024-02-19 DIAGNOSIS — I493 Ventricular premature depolarization: Secondary | ICD-10-CM | POA: Diagnosis not present

## 2024-02-20 LAB — CUP PACEART REMOTE DEVICE CHECK
Battery Remaining Longevity: 34 mo
Battery Remaining Percentage: 35 %
Battery Voltage: 2.9 V
Brady Statistic AP VP Percent: 1 %
Brady Statistic AP VS Percent: 13 %
Brady Statistic AS VP Percent: 8.1 %
Brady Statistic AS VS Percent: 71 %
Brady Statistic RA Percent Paced: 2 %
Brady Statistic RV Percent Paced: 8.8 %
Date Time Interrogation Session: 20251212040016
HighPow Impedance: 74 Ohm
HighPow Impedance: 74 Ohm
Implantable Lead Connection Status: 753985
Implantable Lead Connection Status: 753985
Implantable Lead Implant Date: 20190220
Implantable Lead Implant Date: 20190220
Implantable Lead Location: 753859
Implantable Lead Location: 753860
Implantable Lead Model: 7122
Implantable Pulse Generator Implant Date: 20190220
Lead Channel Impedance Value: 390 Ohm
Lead Channel Impedance Value: 450 Ohm
Lead Channel Pacing Threshold Amplitude: 0.75 V
Lead Channel Pacing Threshold Amplitude: 0.875 V
Lead Channel Pacing Threshold Pulse Width: 0.5 ms
Lead Channel Pacing Threshold Pulse Width: 0.5 ms
Lead Channel Sensing Intrinsic Amplitude: 1.1 mV
Lead Channel Sensing Intrinsic Amplitude: 11.8 mV
Lead Channel Setting Pacing Amplitude: 1.875
Lead Channel Setting Pacing Amplitude: 2.5 V
Lead Channel Setting Pacing Pulse Width: 0.5 ms
Lead Channel Setting Sensing Sensitivity: 0.5 mV
Pulse Gen Serial Number: 9787528

## 2024-02-25 NOTE — Progress Notes (Signed)
 Remote ICD Transmission

## 2024-02-26 ENCOUNTER — Ambulatory Visit: Payer: Self-pay | Admitting: Cardiology

## 2024-03-10 ENCOUNTER — Other Ambulatory Visit: Payer: Self-pay | Admitting: Cardiovascular Disease

## 2024-03-10 DIAGNOSIS — I428 Other cardiomyopathies: Secondary | ICD-10-CM

## 2024-03-10 DIAGNOSIS — I5022 Chronic systolic (congestive) heart failure: Secondary | ICD-10-CM

## 2024-03-15 NOTE — Progress Notes (Unsigned)
 "     Electrophysiology Clinic Note    Date:  03/16/2024  Patient ID:  Leroy, Merritt [birth year], MRN [id number] PCP:  Vicci Duwaine SQUIBB, DO  Cardiologist:  Evalene Lunger, MD  Electrophysiologist:  Fonda Kitty, MD  Electrophysiology APP:  Karmina Zufall, NP     Discussed the use of AI scribe software for clinical note transcription with the patient, who gave verbal consent to proceed.   Patient Profile    Chief Complaint: ICD concern  History of Present Illness: Leroy Merritt is a 79 y.o. 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 Fernande) for acute visit due to ICD concerns.    I saw him 09/2023 for acute visit for dizziness, LH, increased fatigue where EKG showed significant PVC burden. He was transitioned from sotalol  > amiodarone .  I last saw him 01/2024 where original symptoms had improved, but he had developed a slight tremor, amio reduced to 100mg  daily.  02/2024 remote transmission concerning for loss of RV capture.   Today, he feels well without acute complaints. He continues to take amiodarone  daily.    Arrhythmia/Device History [employer] Jude dual chamber ICD, imp 04/2017; dx NICM, VT    AAD History: Sotalol  - stopped 12/2023 Amiodarone  - started 12/2023     ROS:  Please see the history of present illness. All other systems are reviewed and otherwise negative.    Physical Exam    VS:  BP (!) 110/52 (BP Location: Left Arm, Patient Position: Sitting, Cuff Size: Normal)   Pulse 65   Ht 5' 10 (1.778 m)   Wt 181 lb 3.2 oz (82.2 kg)   SpO2 97%   BMI 26.00 kg/m  BMI: Body mass index is 26 kg/m.           Wt Readings from Last 3 Encounters:  03/16/24 181 lb 3.2 oz (82.2 kg)  01/21/24 181 lb (82.1 kg)  01/20/24 180 lb 6.4 oz (81.8 kg)      GEN- The patient is well appearing, alert and oriented x 3 today.   Lungs- Clear to ausculation bilaterally, normal work of breathing.   Heart- Regular rate and rhythm, no murmurs, rubs or gallops Extremities- No peripheral edema, warm, dry Skin-   device pocket well-healed, no tethering   Device interrogation done today and reviewed by myself:  Battery 2.6-2.9 years Lead thresholds, impedence, sensing stable  Manually tested thresholds and compared with cap confirm. Similar values obtained compared to previous in-office check Turned ON auto capture No  episodes    Studies Reviewed   Previous EP, cardiology notes.    EKG is not ordered. Personal review of EKG from 01/20/2024 shows:  SR w 1st deg HB at 79, LAD. PR 256        TTE, 12/15/2023  1. Left ventricular ejection fraction, by estimation, is 45 to 50%. The left ventricle has mildly decreased function. Left ventricular endocardial border not optimally defined to evaluate regional wall motion. Left ventricular diastolic parameters are indeterminate. Beat-to-beat variability with frequent PVCs.   2. Right ventricular systolic function is low normal. The right ventricular size is normal. Tricuspid regurgitation signal is inadequate for assessing PA pressure.   Comparison(s): A prior study was performed on 09/30/22. No significant change from prior study.    TTE, 09/30/2022  1. Left ventricular ejection fraction, by estimation, is 45 to 50%. Left ventricular ejection fraction by PLAX is 47 %.  The left ventricle has mildly decreased function. The left ventricle demonstrates global hypokinesis. Left ventricular diastolic parameters are consistent with Grade I diastolic dysfunction (impaired relaxation). The average left ventricular global longitudinal strain is -16.7 %.   2. Right ventricular systolic function is normal. The right ventricular size is normal. Tricuspid regurgitation signal is inadequate for assessing PA pressure.   3. The mitral valve is normal in structure. Mild mitral valve regurgitation. No evidence of mitral stenosis.   4. The aortic valve has an  indeterminant number of cusps. Aortic valve regurgitation is not visualized. No aortic stenosis is present.   5. The inferior vena cava is normal in size with greater than 50% respiratory variability, suggesting right atrial pressure of 3 mmHg.    TTE, 03/13/2020  1. Left ventricular ejection fraction, by estimation, is 45 to 50%. The left ventricle has mildly decreased function. The left ventricle demonstrates global hypokinesis. There is mild left ventricular hypertrophy. Left ventricular diastolic parameters are consistent with Grade I diastolic dysfunction (impaired relaxation). The average left ventricular global longitudinal strain is -12.7 %. The global longitudinal strain is abnormal.   2. Right ventricular systolic function is normal. The right ventricular size is normal. There is normal pulmonary artery systolic pressure.   3. The mitral valve is normal in structure. Mild mitral valve regurgitation. No evidence of mitral stenosis.   4. The aortic valve has an indeterminant number of cusps. There is mild thickening of the aortic valve. Aortic valve regurgitation is not visualized. Mild aortic valve sclerosis is present, with no evidence of aortic valve stenosis.   5. Aortic dilatation noted. 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    TTE, 03/22/2019 1. Left ventricular ejection fraction, by visual estimation, is 50 to 55%. The left ventricle has low normal function. There is no left ventricular hypertrophy.   2. 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   3. Left ventricular diastolic parameters are indeterminate.   4. The left ventricle has no regional wall motion abnormalities.   5. Global right ventricle has mildly reduced systolic function. The right ventricular size is normal. No increase in right ventricular wall thickness.   6. Right ventricular systolic pressure is normal (20-25 mmHg plus  central venous pressure).   7. Left atrial size was normal.   8. Right atrial size was normal.   9. The pericardium was not well visualized.  10. The mitral valve is grossly normal. Mild to moderate mitral valve regurgitation.  11. The tricuspid valve is not well visualized.  12. The aortic valve is tricuspid. Aortic valve regurgitation is not visualized. No evidence of aortic valve sclerosis or stenosis.  13. The pulmonic valve was not well visualized. Pulmonic valve regurgitation is trivial.  14. Aortic dilatation noted.  15. There is mild dilatation of the aortic root measuring 39 mm.  16. A pacer wire is visualized in the RV.  17. The interatrial septum was not well visualized.    TTE, 04/29/2017 - Left ventricle: The cavity size was normal. Wall thickness was normal. Systolic function was moderately to severely reduced. The estimated ejection fraction was in the range of 30% to 35%. Diffuse hypokinesis. Doppler parameters are consistent with abnormal left ventricular relaxation (grade 1 diastolic dysfunction). - Ventricular septum: Septal motion showed abnormal function and dyssynergy. - Aortic valve: Trileaflet; mildly thickened, mildly calcified leaflets.    Assessment and Plan     #)  VT s/p ICD ICD functioning well Stable lead measurements No concerning arrhythmias VP 10%         Current medicines are reviewed at length with the patient today.   The patient does not have concerns regarding his medicines.  The following changes were made today:  none  Labs/ tests ordered today include:  No orders of the defined types were placed in this encounter.    Disposition: Follow up with Dr. Kennyth or EP APP 5 months   Follow-up with general cardiology next available   Signed, Leroy Haser, NP  03/16/2024  12:26 PM  Electrophysiology [facility] "

## 2024-03-16 ENCOUNTER — Ambulatory Visit: Attending: Cardiology | Admitting: Cardiology

## 2024-03-16 ENCOUNTER — Encounter: Payer: Self-pay | Admitting: Cardiology

## 2024-03-16 VITALS — BP 110/52 | HR 65 | Ht 70.0 in | Wt 181.2 lb

## 2024-03-16 DIAGNOSIS — I472 Ventricular tachycardia, unspecified: Secondary | ICD-10-CM

## 2024-03-16 DIAGNOSIS — I493 Ventricular premature depolarization: Secondary | ICD-10-CM

## 2024-03-16 DIAGNOSIS — Z95810 Presence of automatic (implantable) cardiac defibrillator: Secondary | ICD-10-CM | POA: Diagnosis not present

## 2024-03-16 LAB — CUP PACEART INCLINIC DEVICE CHECK
Battery Remaining Longevity: 36 mo
Brady Statistic RA Percent Paced: 1.7 %
Brady Statistic RV Percent Paced: 10 %
Date Time Interrogation Session: 20260107123303
HighPow Impedance: 79.875
Implantable Lead Connection Status: 753985
Implantable Lead Connection Status: 753985
Implantable Lead Implant Date: 20190220
Implantable Lead Implant Date: 20190220
Implantable Lead Location: 753859
Implantable Lead Location: 753860
Implantable Lead Model: 7122
Implantable Pulse Generator Implant Date: 20190220
Lead Channel Impedance Value: 387.5 Ohm
Lead Channel Impedance Value: 400 Ohm
Lead Channel Pacing Threshold Amplitude: 0.75 V
Lead Channel Pacing Threshold Amplitude: 1 V
Lead Channel Pacing Threshold Amplitude: 1.125 V
Lead Channel Pacing Threshold Amplitude: 1.25 V
Lead Channel Pacing Threshold Pulse Width: 0.5 ms
Lead Channel Pacing Threshold Pulse Width: 0.5 ms
Lead Channel Pacing Threshold Pulse Width: 0.5 ms
Lead Channel Pacing Threshold Pulse Width: 0.5 ms
Lead Channel Sensing Intrinsic Amplitude: 1.1 mV
Lead Channel Sensing Intrinsic Amplitude: 11.8 mV
Lead Channel Setting Pacing Amplitude: 1 V
Lead Channel Setting Pacing Amplitude: 2.125
Lead Channel Setting Pacing Pulse Width: 0.5 ms
Lead Channel Setting Sensing Sensitivity: 0.5 mV
Pulse Gen Serial Number: 9787528

## 2024-03-16 NOTE — Patient Instructions (Signed)
 Medication Instructions:  Your physician recommends that you continue on your current medications as directed. Please refer to the Current Medication list given to you today.  *If you need a refill on your cardiac medications before your next appointment, please call your pharmacy*  Lab Work: No labs ordered today    Testing/Procedures: No test ordered today   Follow-Up: At [facility], you and your health needs are our priority.  As part of our continuing mission to provide you with exceptional heart care, our providers are all part of one team.  This team includes your primary Cardiologist (physician) and Advanced Practice Providers or APPs (Physician Assistants and Nurse Practitioners) who all work together to provide you with the care you need, when you need it.  Your next appointment:   5 month(s)  Provider:   Suzann Riddle, NP or Dr. Fonda Kitty     Next available follow up with Dr. Gollan.

## 2024-03-19 ENCOUNTER — Ambulatory Visit: Payer: Self-pay | Admitting: Cardiology

## 2024-03-29 ENCOUNTER — Encounter: Payer: Self-pay | Admitting: Cardiovascular Disease

## 2024-03-29 DIAGNOSIS — I428 Other cardiomyopathies: Secondary | ICD-10-CM

## 2024-03-29 DIAGNOSIS — I5022 Chronic systolic (congestive) heart failure: Secondary | ICD-10-CM

## 2024-03-29 MED ORDER — SACUBITRIL-VALSARTAN 49-51 MG PO TABS: 1.0000 | ORAL_TABLET | Freq: Two times a day (BID) | ORAL | 0 refills | Status: AC

## 2024-04-26 ENCOUNTER — Ambulatory Visit: Admitting: Cardiovascular Disease

## 2024-05-19 ENCOUNTER — Ambulatory Visit: Admitting: Cardiology

## 2024-05-20 ENCOUNTER — Ambulatory Visit

## 2024-08-15 ENCOUNTER — Ambulatory Visit: Admitting: Cardiology

## 2024-08-19 ENCOUNTER — Ambulatory Visit

## 2024-11-18 ENCOUNTER — Ambulatory Visit

## 2025-02-17 ENCOUNTER — Ambulatory Visit
